# Patient Record
Sex: Female | Born: 1985
Health system: Southern US, Community
[De-identification: ages and names within clinical notes are randomized; demographics above are authoritative.]

## PROBLEM LIST (undated history)

## (undated) DIAGNOSIS — Z23 Encounter for immunization: Secondary | ICD-10-CM

## (undated) DIAGNOSIS — F419 Anxiety disorder, unspecified: Secondary | ICD-10-CM

## (undated) DIAGNOSIS — F32A Depression, unspecified: Secondary | ICD-10-CM

## (undated) DIAGNOSIS — K219 Gastro-esophageal reflux disease without esophagitis: Secondary | ICD-10-CM

## (undated) DIAGNOSIS — Z803 Family history of malignant neoplasm of breast: Secondary | ICD-10-CM

## (undated) DIAGNOSIS — E559 Vitamin D deficiency, unspecified: Secondary | ICD-10-CM

## (undated) DIAGNOSIS — F329 Major depressive disorder, single episode, unspecified: Secondary | ICD-10-CM

## (undated) DIAGNOSIS — Z8049 Family history of malignant neoplasm of other genital organs: Secondary | ICD-10-CM

## (undated) DIAGNOSIS — Z808 Family history of malignant neoplasm of other organs or systems: Secondary | ICD-10-CM

## (undated) DIAGNOSIS — D649 Anemia, unspecified: Secondary | ICD-10-CM

## (undated) DIAGNOSIS — Z9289 Personal history of other medical treatment: Secondary | ICD-10-CM

## (undated) HISTORY — DX: Family history of malignant neoplasm of other organs or systems: Z80.8

## (undated) HISTORY — DX: Anemia, unspecified: D64.9

## (undated) HISTORY — DX: Personal history of other medical treatment: Z92.89

## (undated) HISTORY — DX: Depression, unspecified: F32.A

## (undated) HISTORY — DX: Vitamin D deficiency, unspecified: E55.9

## (undated) HISTORY — DX: Gastro-esophageal reflux disease without esophagitis: K21.9

## (undated) HISTORY — DX: Family history of malignant neoplasm of other genital organs: Z80.49

## (undated) HISTORY — DX: Family history of malignant neoplasm of breast: Z80.3

## (undated) HISTORY — DX: Major depressive disorder, single episode, unspecified: F32.9

## (undated) HISTORY — DX: Anxiety disorder, unspecified: F41.9

## (undated) HISTORY — DX: Encounter for immunization: Z23

---
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
## 2006-05-07 HISTORY — PX: ESOPHAGOGASTRODUODENOSCOPY: SHX1529

## 2007-03-05 ENCOUNTER — Ambulatory Visit: Payer: Self-pay | Admitting: Gastroenterology

## 2007-06-27 ENCOUNTER — Emergency Department: Payer: Self-pay | Admitting: Emergency Medicine

## 2011-07-06 DIAGNOSIS — E559 Vitamin D deficiency, unspecified: Secondary | ICD-10-CM

## 2011-07-06 HISTORY — DX: Vitamin D deficiency, unspecified: E55.9

## 2011-08-24 ENCOUNTER — Ambulatory Visit: Payer: Self-pay | Admitting: Physician Assistant

## 2011-09-05 ENCOUNTER — Ambulatory Visit: Payer: Self-pay | Admitting: Physician Assistant

## 2012-09-04 ENCOUNTER — Encounter: Payer: Self-pay | Admitting: Family Medicine

## 2012-09-04 ENCOUNTER — Telehealth: Payer: Self-pay | Admitting: *Deleted

## 2012-09-04 DIAGNOSIS — B372 Candidiasis of skin and nail: Secondary | ICD-10-CM

## 2012-09-04 MED ORDER — FLUCONAZOLE 100 MG PO TABS: 100.0000 mg | ORAL_TABLET | Freq: Every day | ORAL | Status: DC

## 2012-09-04 NOTE — Telephone Encounter (Signed)
Patient called requesting medication for yeast infection in her breast.  She had this same thing when she initially started breast feeding her daughter and is feeling the same symptoms as she felt the last time.  We will call in Diflucan for her.  She will follow up if symptoms change or fail to get better.

## 2012-12-17 NOTE — Progress Notes (Signed)
This encounter was created in error - please disregard.

## 2015-08-02 LAB — HM PAP SMEAR

## 2015-08-10 DIAGNOSIS — N870 Mild cervical dysplasia: Secondary | ICD-10-CM | POA: Diagnosis not present

## 2016-05-14 DIAGNOSIS — F411 Generalized anxiety disorder: Secondary | ICD-10-CM | POA: Diagnosis not present

## 2016-05-22 DIAGNOSIS — F411 Generalized anxiety disorder: Secondary | ICD-10-CM | POA: Diagnosis not present

## 2016-05-29 DIAGNOSIS — F411 Generalized anxiety disorder: Secondary | ICD-10-CM | POA: Diagnosis not present

## 2016-06-04 DIAGNOSIS — F411 Generalized anxiety disorder: Secondary | ICD-10-CM | POA: Diagnosis not present

## 2016-06-26 DIAGNOSIS — F411 Generalized anxiety disorder: Secondary | ICD-10-CM | POA: Diagnosis not present

## 2016-07-09 DIAGNOSIS — F411 Generalized anxiety disorder: Secondary | ICD-10-CM | POA: Diagnosis not present

## 2016-07-31 DIAGNOSIS — F4323 Adjustment disorder with mixed anxiety and depressed mood: Secondary | ICD-10-CM | POA: Diagnosis not present

## 2016-08-02 ENCOUNTER — Encounter: Payer: Self-pay | Admitting: Obstetrics and Gynecology

## 2016-08-02 ENCOUNTER — Ambulatory Visit (INDEPENDENT_AMBULATORY_CARE_PROVIDER_SITE_OTHER): Payer: BLUE CROSS/BLUE SHIELD | Admitting: Obstetrics and Gynecology

## 2016-08-02 VITALS — BP 100/70 | HR 83 | Ht 63.0 in | Wt 182.0 lb

## 2016-08-02 DIAGNOSIS — Z01419 Encounter for gynecological examination (general) (routine) without abnormal findings: Secondary | ICD-10-CM | POA: Diagnosis not present

## 2016-08-02 DIAGNOSIS — Z124 Encounter for screening for malignant neoplasm of cervix: Secondary | ICD-10-CM

## 2016-08-02 DIAGNOSIS — Z87898 Personal history of other specified conditions: Secondary | ICD-10-CM | POA: Diagnosis not present

## 2016-08-02 DIAGNOSIS — Z30432 Encounter for removal of intrauterine contraceptive device: Secondary | ICD-10-CM

## 2016-08-02 DIAGNOSIS — N941 Unspecified dyspareunia: Secondary | ICD-10-CM | POA: Diagnosis not present

## 2016-08-02 DIAGNOSIS — Z8742 Personal history of other diseases of the female genital tract: Secondary | ICD-10-CM

## 2016-08-02 DIAGNOSIS — Z113 Encounter for screening for infections with a predominantly sexual mode of transmission: Secondary | ICD-10-CM

## 2016-08-02 DIAGNOSIS — Z1151 Encounter for screening for human papillomavirus (HPV): Secondary | ICD-10-CM | POA: Diagnosis not present

## 2016-08-02 NOTE — Progress Notes (Signed)
HPI:      [name] is a 31 y.o. G0P0000 who LMP was Patient's last menstrual period was 06/24/2016., presents today for her annual examination.  Her menses are rare, lasting 1 days.  Dysmenorrhea none. She does not have intermenstrual bleeding.  Sex activity: single partner, contraception - IUD. Mirena placed 03/13/12. She has noted dyspareunia for the past 6 months. She would like IUD removed and would like to use condoms for now. She may want to restart nuvaring in the future.  Last Pap: August 02, 2015  Results were: low-grade squamous intraepithelial neoplasia (LGSIL - encompassing HPV,mild dysplasia,CIN I) . Colpo 4/17 with neg ECC. Repeat pap due today.   There is no FH of breast cancer. There is no FH of ovarian cancer. The patient does do self-breast exams.  Tobacco use: The patient denies current or previous tobacco use. Alcohol use: social drinker Exercise: moderately active  She does get adequate calcium and Vitamin D in her diet.   No past medical history on file.  No past surgical history on file.  Family History  Problem Relation Age of Onset  . Skin cancer Mother   . Lung cancer Maternal Grandmother      ROS:  Review of Systems  Constitutional: Negative for fever, malaise/fatigue and weight loss.  HENT: Negative for congestion, ear pain and sinus pain.   Respiratory: Negative for cough, shortness of breath and wheezing.   Cardiovascular: Negative for chest pain, orthopnea and leg swelling.  Gastrointestinal: Negative for blood in stool, constipation, diarrhea, nausea and vomiting.  Genitourinary: Negative for dysuria, flank pain, frequency, hematuria and urgency.       Breast ROS: negative   Musculoskeletal: Negative for back pain, joint pain and myalgias.  Skin: Negative for itching and rash.  Neurological: Negative for dizziness, tingling, focal weakness and headaches.  Endo/Heme/Allergies: Negative for environmental allergies. Does not  bruise/bleed easily.  Psychiatric/Behavioral: Negative for depression and suicidal ideas. The patient is not nervous/anxious and does not have insomnia.     Objective: BP 100/70   Pulse 83   Ht 5\' 3"  (1.6 m)   Wt 182 lb (82.6 kg)   LMP 06/24/2016 Comment: iud  BMI 32.24 kg/m    Physical Exam  Constitutional: She is oriented to person, place, and time. She appears well-developed and well-nourished.  Genitourinary: Vagina normal and uterus normal. No erythema or tenderness in the vagina. No vaginal discharge found. Right adnexum does not display mass and does not display tenderness. Left adnexum does not display mass and does not display tenderness.  Cervix exhibits visible IUD strings. Cervix does not exhibit motion tenderness or polyp. Uterus is not enlarged or tender.  Neck: Normal range of motion. No thyromegaly present.  Cardiovascular: Normal rate, regular rhythm and normal heart sounds.   No murmur heard. Pulmonary/Chest: Effort normal and breath sounds normal. Right breast exhibits no mass, no nipple discharge, no skin change and no tenderness. Left breast exhibits no mass, no nipple discharge, no skin change and no tenderness.  Abdominal: Soft. There is no tenderness. There is no guarding.  Musculoskeletal: Normal range of motion.  Neurological: She is alert and oriented to person, place, and time. No cranial nerve deficit.  Psychiatric: She has a normal mood and affect. Her behavior is normal.  Vitals reviewed.   Assessment/Plan: Encounter for annual routine gynecological examination  Cervical cancer screening - Plan: IGP,CtNgTv,Apt HPV,rfx16/18,45  Screening for HPV (human papillomavirus) - Plan: IGP,CtNgTv,Apt HPV,rfx16/18,45  History  of abnormal cervical Pap smear - Will call pt with results and mgmt. - Plan: IGP,CtNgTv,Apt HPV,rfx16/18,45  Screening for STD (sexually transmitted disease) - Plan: IGP,CtNgTv,Apt HPV,rfx16/18,45  Encounter for removal of intrauterine  contraceptive device (IUD) - Pt wants IUD removed due to dyspareunia for the past 6 months. Due for rem 11/18 anyway. Pt to f/u if desires BC, possibly nuvaring. Condoms in meantime.  Dyspareunia in female - Check nuswab. F/u for u/s if sx persist.            GYN counsel family planning choices, adequate intake of calcium and vitamin D     F/U  Return in about 1 year (around 08/02/2017).  Briana Reed. Briana Gilmore, PA-C 08/02/2016 10:21 AM   IUD REMOVAL NOTE:    History of Present Illness:  Briana Reed is a 31 y.o. that had a Mirena IUD placed approximately 4 yrs ago.   BP 100/70   Pulse 83   Ht 5\' 3"  (1.6 m)   Wt 182 lb (82.6 kg)   LMP 06/24/2016 Comment: iud  BMI 32.24 kg/m   Pelvic exam:  Two IUD strings present seen coming from the cervical os. EGBUS, vaginal vault and cervix: within normal limits  IUD Removal Strings of IUD identified and grasped.  IUD removed without problem with ring forceps.  Pt tolerated this well.  IUD noted to be intact.  Assessment:  IUD Removal   Plan: IUD removed and plan for contraception is condoms. She was amenable to this plan.   Briana Reed Reed. Briana Townley, PA-C 08/02/2016 10:21 AM

## 2016-08-09 LAB — IGP,CTNGTV,APT HPV,RFX16/18,45
Chlamydia, Nuc. Acid Amp: NEGATIVE
GONOCOCCUS, NUC. ACID AMP: NEGATIVE
HPV APTIMA: POSITIVE — AB
HPV Genotype 16: NEGATIVE
HPV Genotype 18,45: NEGATIVE
PAP Smear Comment: 0
Trich vag by NAA: NEGATIVE

## 2016-08-14 DIAGNOSIS — F4323 Adjustment disorder with mixed anxiety and depressed mood: Secondary | ICD-10-CM | POA: Diagnosis not present

## 2016-09-18 DIAGNOSIS — F4323 Adjustment disorder with mixed anxiety and depressed mood: Secondary | ICD-10-CM | POA: Diagnosis not present

## 2017-01-08 ENCOUNTER — Ambulatory Visit: Payer: BLUE CROSS/BLUE SHIELD | Admitting: Obstetrics and Gynecology

## 2017-01-16 ENCOUNTER — Telehealth: Payer: Self-pay

## 2017-01-16 ENCOUNTER — Other Ambulatory Visit: Payer: Self-pay | Admitting: Obstetrics and Gynecology

## 2017-01-16 MED ORDER — ETONOGESTREL-ETHINYL ESTRADIOL 0.12-0.015 MG/24HR VA RING: VAGINAL_RING | VAGINAL | 7 refills | Status: DC

## 2017-01-16 NOTE — Telephone Encounter (Signed)
Pt aware via vm 

## 2017-01-16 NOTE — Telephone Encounter (Signed)
Pt wanting NuvaRing RX. States that she spoke with [facility] and was told to call and let her know which BC she wanted to try. fwding to [facility] for RX for [employer].

## 2017-01-16 NOTE — Telephone Encounter (Signed)
Rx eRxd. RN to notify pt to start with next menses. Condoms for 1 mo. F/u prn.

## 2017-01-28 DIAGNOSIS — F4323 Adjustment disorder with mixed anxiety and depressed mood: Secondary | ICD-10-CM | POA: Diagnosis not present

## 2017-02-04 DIAGNOSIS — F411 Generalized anxiety disorder: Secondary | ICD-10-CM | POA: Diagnosis not present

## 2017-02-18 DIAGNOSIS — F411 Generalized anxiety disorder: Secondary | ICD-10-CM | POA: Diagnosis not present

## 2017-03-13 DIAGNOSIS — F411 Generalized anxiety disorder: Secondary | ICD-10-CM | POA: Diagnosis not present

## 2017-04-03 DIAGNOSIS — F411 Generalized anxiety disorder: Secondary | ICD-10-CM | POA: Diagnosis not present

## 2017-04-17 DIAGNOSIS — F411 Generalized anxiety disorder: Secondary | ICD-10-CM | POA: Diagnosis not present

## 2017-04-25 DIAGNOSIS — F411 Generalized anxiety disorder: Secondary | ICD-10-CM | POA: Diagnosis not present

## 2017-05-08 DIAGNOSIS — F411 Generalized anxiety disorder: Secondary | ICD-10-CM | POA: Diagnosis not present

## 2017-05-16 DIAGNOSIS — F411 Generalized anxiety disorder: Secondary | ICD-10-CM | POA: Diagnosis not present

## 2017-05-28 DIAGNOSIS — F411 Generalized anxiety disorder: Secondary | ICD-10-CM | POA: Diagnosis not present

## 2017-06-12 ENCOUNTER — Encounter: Payer: Self-pay | Admitting: Family Medicine

## 2017-06-12 ENCOUNTER — Encounter (INDEPENDENT_AMBULATORY_CARE_PROVIDER_SITE_OTHER): Payer: Self-pay

## 2017-06-12 ENCOUNTER — Ambulatory Visit: Payer: BLUE CROSS/BLUE SHIELD | Admitting: Family Medicine

## 2017-06-12 VITALS — BP 122/74 | HR 82 | Temp 98.6°F | Resp 16 | Ht 64.75 in | Wt 188.5 lb

## 2017-06-12 DIAGNOSIS — R5383 Other fatigue: Secondary | ICD-10-CM | POA: Diagnosis not present

## 2017-06-12 DIAGNOSIS — Z23 Encounter for immunization: Secondary | ICD-10-CM

## 2017-06-12 DIAGNOSIS — Z13220 Encounter for screening for lipoid disorders: Secondary | ICD-10-CM | POA: Diagnosis not present

## 2017-06-12 DIAGNOSIS — R87810 Cervical high risk human papillomavirus (HPV) DNA test positive: Secondary | ICD-10-CM

## 2017-06-12 DIAGNOSIS — Z113 Encounter for screening for infections with a predominantly sexual mode of transmission: Secondary | ICD-10-CM | POA: Diagnosis not present

## 2017-06-12 DIAGNOSIS — R87610 Atypical squamous cells of undetermined significance on cytologic smear of cervix (ASC-US): Secondary | ICD-10-CM | POA: Insufficient documentation

## 2017-06-12 DIAGNOSIS — G4700 Insomnia, unspecified: Secondary | ICD-10-CM | POA: Diagnosis not present

## 2017-06-12 DIAGNOSIS — Z131 Encounter for screening for diabetes mellitus: Secondary | ICD-10-CM

## 2017-06-12 DIAGNOSIS — R635 Abnormal weight gain: Secondary | ICD-10-CM | POA: Diagnosis not present

## 2017-06-12 DIAGNOSIS — L659 Nonscarring hair loss, unspecified: Secondary | ICD-10-CM | POA: Diagnosis not present

## 2017-06-12 DIAGNOSIS — F411 Generalized anxiety disorder: Secondary | ICD-10-CM | POA: Diagnosis not present

## 2017-06-12 DIAGNOSIS — F330 Major depressive disorder, recurrent, mild: Secondary | ICD-10-CM | POA: Diagnosis not present

## 2017-06-12 MED ORDER — BUPROPION HCL ER (XL) 150 MG PO TB24: 150.0000 mg | ORAL_TABLET | Freq: Every day | ORAL | 1 refills | Status: DC

## 2017-06-12 NOTE — Patient Instructions (Signed)
Persistent Depressive Disorder Persistent depressive disorder (PDD) is a mental health condition. PDD causes symptoms of low-level depression for 2 years or longer. It may also be called long-term (chronic) depression or dysthymia. PDD may include episodes of more severe depression that last for about 2 weeks (major depressive disorder or MDD). PDD can affect the way you think, feel, and sleep. This condition may also affect your relationships. You may be more likely to get sick if you have PDD. Symptoms of PDD occur for most of the day and may include:  Feeling tired (fatigue).  Low energy.  Eating too much or too little.  Sleeping too much or too little.  Feeling restless or agitated.  Feeling hopeless.  Feeling worthless or guilty.  Feeling worried or nervous (anxiety).  Trouble concentrating or making decisions.  Low self-esteem.  A negative way of looking at things (outlook).  Not being able to have fun or feel pleasure.  Avoiding interacting with people.  Getting angry or annoyed easily (irritability).  Acting aggressive or angry.  Follow these instructions at home: Activity  Go back to your normal activities as told by your doctor.  Exercise regularly as told by your doctor. General instructions  Take over-the-counter and prescription medicines only as told by your doctor.  Do not drink alcohol. Or, limit how much alcohol you drink to no more than 1 drink a day for nonpregnant women and 2 drinks a day for men. One drink equals 12 oz of beer, 5 oz of wine, or 1 oz of hard liquor. Alcohol can affect any antidepressant medicines you are taking. Talk with your doctor about your alcohol use.  Eat a healthy diet and get plenty of sleep.  Find activities that you enjoy each day.  Consider joining a support group. Your doctor may be able to suggest a support group.  Keep all follow-up visits as told by your doctor. This is important. Where to find more  information: National Alliance on Mental Illness  www.nami.org  U.[place]. National Institute of Mental Health  www.nimh.nih.gov  National Suicide Prevention Lifeline  1-800-273-TALK (1-800-273-8255). This is free, 24-hour help.  Contact a doctor if:  Your symptoms get worse.  You have new symptoms.  You have trouble sleeping or doing your daily activities. Get help right away if:  You self-harm.  You have serious thoughts about hurting yourself or others.  You see, hear, taste, smell, or feel things that are not there (hallucinate). This information is not intended to replace advice given to you by your health care provider. Make sure you discuss any questions you have with your health care provider. Document Released: 04/04/2015 Document Revised: 12/16/2015 Document Reviewed: 12/16/2015 Elsevier Interactive Patient Education  2017 Elsevier Inc.  

## 2017-06-12 NOTE — Progress Notes (Signed)
Name: Briana Reed   MRN: [id number]    DOB: 05/11/1985   Date:06/12/2017       Progress Note  Subjective  Chief Complaint  Chief Complaint  Patient presents with  . Depression  . Insomnia    HPI  Depression: she has a long history of depression - symptoms started in 6th grade, she took Prozac and Wellbutrin in the past and likely Wellbutrin the past. She has been off medication for the past 8 years. She went through a divorce in 2017, having therapy with Dr. Sunday ShamsFaro since 2018 twice a month, however noticing that symptoms worse over the past 6 months. Mother diagnosed with endometrial cancer back in 01/2017, her sister has not sharing the load. Living with her boyfriend, and he is good but he struggles financially. 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 She is not able to maintain her house organized, she enjoys cooking but has been slacking at that also. Difficulty falling and staying asleep. Gaining weight, feeling tired. Stopped going to the gym two weeks ago. Difficulty getting motivated.    Patient Active Problem List   Diagnosis Date Noted  . ASCUS with positive high risk HPV cervical 06/12/2017    Past Surgical History:  Procedure Laterality Date  . ESOPHAGOGASTRODUODENOSCOPY  2008   WNL    Family History  Problem Relation Age of Onset  . Skin cancer Mother [id number]       MELANOMA  . Hypertension Mother   . Endometrial cancer Mother        on radiation treatments  . Lung cancer Maternal Grandmother 80  . Uterine cancer Maternal Grandmother   . Hypertension Father   . Stroke Father   . Heart attack Maternal Grandfather   . Heart attack Paternal Grandmother   . Heart attack Paternal Grandfather     Social History   Socioeconomic History  . Marital status: Single    Spouse name: Not on file  . Number of children: 0  . Years of education: 5414  . Highest education level: Associate degree:  occupational, Scientist, product/process developmenttechnical, or vocational program  Social Needs  . Financial resource strain: Not very hard  . Food insecurity - worry: Never true  . Food insecurity - inability: Never true  . Transportation needs - medical: No  . Transportation needs - non-medical: No  Occupational History  . Occupation: bartender     Comment: [employer] -[employer]aurant   Tobacco Use  . Smoking status: Never Smoker  . Smokeless tobacco: Never Used  Substance and Sexual Activity  . Alcohol use: Yes    Alcohol/week: 1.8 oz    Types: 3 Glasses of wine per week  . Drug use: No  . Sexual activity: Yes    Partners: Male    Birth control/protection: Condom  Other Topics Concern  . Not on file  Social History Narrative   She lives with boyfriend ( he is a Insurance underwritertattoo artist) , she works as a Leisure centre managerbartender and is going to school full time to become a Research officer, trade unionmortician    Worried about her mother, getting treatment for endometrial cancer   She was married previously from 2013 till 2016 - divorced since 2017     Current Outpatient Medications:  .  BIOTIN SL, Place 10,000 each under the tongue daily., Disp: , Rfl:  .  Echinacea 400 MG CAPS, Take 1 capsule by mouth as needed., Disp: , Rfl:  .  Prenatal Vit-Fe Fumarate-FA (PRENATAL MULTIVITAMIN) TABS tablet, Take 1 tablet by mouth daily at 12 noon., Disp: , Rfl:  .  vitamin C (ASCORBIC ACID) 500 MG tablet, Take 1,000 mg by mouth as needed., Disp: , Rfl:  .  buPROPion (WELLBUTRIN XL) 150 MG 24 hr tablet, Take 1 tablet (150 mg total) by mouth daily., Disp: 30 tablet, Rfl: 1  No Known Allergies   ROS  Constitutional: Negative for fever, positive for  weight change.  Respiratory: Negative for cough and shortness of breath.   Cardiovascular: Negative for chest pain or palpitations.  Gastrointestinal: Negative for abdominal pain, no bowel changes.  Musculoskeletal: Negative for gait problem or joint swelling.  Skin: Negative for rash.  Neurological: Negative for dizziness or headache.   No other specific complaints in a complete review of systems (except as listed in HPI above).  Objective  Vitals:   06/12/17 0913  BP: 122/74  Pulse: 82  Resp: 16  Temp: 98.6 F (37 C)  TempSrc: Oral  SpO2: 98%  Weight: 188 lb 8 oz (85.5 kg)  Height: 5' 4.75" (1.645 m)    Body mass index is 31.61 kg/m.  Physical Exam  Constitutional: Patient appears well-developed and well-nourished. Obese  No distress.  HEENT: head atraumatic, normocephalic, pupils equal and reactive to light, neck supple, throat within normal limits Cardiovascular: Normal rate, regular rhythm and normal heart sounds.  No murmur heard. No BLE edema. Pulmonary/Chest: Effort normal and breath sounds normal. No respiratory distress. Abdominal: Soft.  There is no tenderness. Psychiatric: Patient has a normal mood and affect. behavior is normal. Judgment and thought content normal.  PHQ2/9: Depression screen PHQ 2/9 06/12/2017  Decreased Interest 2  Down, Depressed, Hopeless 2  PHQ - 2 Score 4  Altered sleeping 2  Tired, decreased energy 3  Change in appetite 1  Feeling bad or failure about yourself  1  Trouble concentrating 3  Moving slowly or fidgety/restless 0  Suicidal thoughts 0  PHQ-9 Score 14  Difficult doing work/chores Somewhat difficult    Fall Risk: Fall Risk  06/12/2017  Falls in the past year? No    Functional Status Survey: Is the patient deaf or have difficulty hearing?: No Does the patient have difficulty seeing, even when wearing glasses/contacts?: No Does the patient have difficulty concentrating, remembering, or making decisions?: No Does the patient have difficulty walking or climbing stairs?: No Does the patient have difficulty dressing or bathing?: No Does the patient have difficulty doing errands alone such as visiting a doctor's office or shopping?: No    Assessment & Plan  1. Mild recurrent major depression (HCC)  - buPROPion (WELLBUTRIN XL) 150 MG 24 hr tablet; Take  1 tablet (150 mg total) by mouth daily.  Dispense: 30 tablet; Refill: 1  2. Insomnia, unspecified type  She will try otc melatonin  3. Weight gain  - TSH  4. Hair loss  - TSH  5. Lipid screening  - Lipid panel  6. Diabetes mellitus screening  - Hemoglobin A1c  7. Needs flu shot  refused  8. Need for Tdap vaccination  - Tdap vaccine greater than or equal to 7yo IM  9. Routine screening for STI (sexually transmitted infection)  - RPR - HIV antibody - Hepatitis, Acute  10. Other fatigue  - Vitamin B12 - VITAMIN D 25 Hydroxy (Vit-D Deficiency, Fractures) - CBC with Differential/Platelet - Comprehensive metabolic panel

## 2017-06-13 LAB — CBC WITH DIFFERENTIAL/PLATELET
BASOS PCT: 0.6 %
Basophils Absolute: 43 cells/uL (ref 0–200)
EOS PCT: 0.6 %
Eosinophils Absolute: 43 cells/uL (ref 15–500)
HEMATOCRIT: 37.8 % (ref 35.0–45.0)
HEMOGLOBIN: 13 g/dL (ref 11.7–15.5)
LYMPHS ABS: 1490 {cells}/uL (ref 850–3900)
MCH: 32.1 pg (ref 27.0–33.0)
MCHC: 34.4 g/dL (ref 32.0–36.0)
MCV: 93.3 fL (ref 80.0–100.0)
MONOS PCT: 7.8 %
MPV: 9.8 fL (ref 7.5–12.5)
NEUTROS ABS: 5062 {cells}/uL (ref 1500–7800)
Neutrophils Relative %: 70.3 %
Platelets: 349 10*3/uL (ref 140–400)
RBC: 4.05 10*6/uL (ref 3.80–5.10)
RDW: 12 % (ref 11.0–15.0)
Total Lymphocyte: 20.7 %
WBC mixed population: 562 cells/uL (ref 200–950)
WBC: 7.2 10*3/uL (ref 3.8–10.8)

## 2017-06-13 LAB — HEPATITIS PANEL, ACUTE
HEP A IGM: NONREACTIVE
Hep B C IgM: NONREACTIVE
Hepatitis B Surface Ag: NONREACTIVE
Hepatitis C Ab: NONREACTIVE
SIGNAL TO CUT-OFF: 0.01 (ref <1.00)

## 2017-06-13 LAB — LIPID PANEL
Cholesterol: 140 mg/dL (ref <200)
HDL: 71 mg/dL (ref >50)
LDL Cholesterol (Calc): 55 mg/dL (calc)
NON-HDL CHOLESTEROL (CALC): 69 mg/dL (ref <130)
TRIGLYCERIDES: 60 mg/dL (ref <150)
Total CHOL/HDL Ratio: 2 (calc) (ref <5.0)

## 2017-06-13 LAB — COMPREHENSIVE METABOLIC PANEL
AG RATIO: 1.6 (calc) (ref 1.0–2.5)
ALBUMIN MSPROF: 4.5 g/dL (ref 3.6–5.1)
ALT: 10 U/L (ref 6–29)
AST: 10 U/L (ref 10–30)
Alkaline phosphatase (APISO): 43 U/L (ref 33–115)
BILIRUBIN TOTAL: 0.4 mg/dL (ref 0.2–1.2)
BUN: 7 mg/dL (ref 7–25)
CALCIUM: 9.8 mg/dL (ref 8.6–10.2)
CO2: 25 mmol/L (ref 20–32)
Chloride: 105 mmol/L (ref 98–110)
Creat: 0.65 mg/dL (ref 0.50–1.10)
GLOBULIN: 2.9 g/dL (ref 1.9–3.7)
GLUCOSE: 100 mg/dL (ref 65–139)
POTASSIUM: 4.3 mmol/L (ref 3.5–5.3)
SODIUM: 138 mmol/L (ref 135–146)
TOTAL PROTEIN: 7.4 g/dL (ref 6.1–8.1)

## 2017-06-13 LAB — HEMOGLOBIN A1C
Hgb A1c MFr Bld: 5 % of total Hgb (ref <5.7)
Mean Plasma Glucose: 97 (calc)
eAG (mmol/L): 5.4 (calc)

## 2017-06-13 LAB — VITAMIN D 25 HYDROXY (VIT D DEFICIENCY, FRACTURES): Vit D, 25-Hydroxy: 26 ng/mL — ABNORMAL LOW (ref 30–100)

## 2017-06-13 LAB — HIV ANTIBODY (ROUTINE TESTING W REFLEX): HIV 1&2 Ab, 4th Generation: NONREACTIVE

## 2017-06-13 LAB — RPR: RPR: NONREACTIVE

## 2017-06-13 LAB — TSH: TSH: 2.08 m[IU]/L

## 2017-06-13 LAB — VITAMIN B12: VITAMIN B 12: 430 pg/mL (ref 200–1100)

## 2017-06-19 ENCOUNTER — Ambulatory Visit: Payer: BLUE CROSS/BLUE SHIELD | Admitting: Obstetrics and Gynecology

## 2017-06-26 DIAGNOSIS — F411 Generalized anxiety disorder: Secondary | ICD-10-CM | POA: Diagnosis not present

## 2017-07-16 DIAGNOSIS — F411 Generalized anxiety disorder: Secondary | ICD-10-CM | POA: Diagnosis not present

## 2017-07-24 ENCOUNTER — Ambulatory Visit: Payer: BLUE CROSS/BLUE SHIELD | Admitting: Family Medicine

## 2017-07-24 ENCOUNTER — Encounter: Payer: Self-pay | Admitting: Family Medicine

## 2017-07-24 VITALS — BP 126/74 | HR 90 | Temp 97.9°F | Resp 16 | Ht 65.0 in | Wt 187.3 lb

## 2017-07-24 DIAGNOSIS — Z23 Encounter for immunization: Secondary | ICD-10-CM | POA: Diagnosis not present

## 2017-07-24 DIAGNOSIS — F330 Major depressive disorder, recurrent, mild: Secondary | ICD-10-CM

## 2017-07-24 DIAGNOSIS — G4700 Insomnia, unspecified: Secondary | ICD-10-CM | POA: Diagnosis not present

## 2017-07-24 MED ORDER — BUPROPION HCL ER (XL) 150 MG PO TB24: 150.0000 mg | ORAL_TABLET | Freq: Every day | ORAL | 0 refills | Status: DC

## 2017-07-24 MED ORDER — TRAZODONE HCL 50 MG PO TABS: 25.0000 mg | ORAL_TABLET | Freq: Every evening | ORAL | 0 refills | Status: DC | PRN

## 2017-07-24 NOTE — Progress Notes (Signed)
Name: Briana Reed   MRN: [id number]    DOB: [birth year]   Date:07/24/2017       Progress Note  Subjective  Chief Complaint  Chief Complaint  Patient presents with  . Depression    medication has depression under control she feels much better since starting medication.    HPI  Depression Major: she is doing better, on Wellbutrin for the past 6 weeks, and not feeling as overwhelmed. Phq9 has improved down from 14 to 8. Initially felt jittery with wellbutrin but that has resolved. She has more energy, still has difficulty focusing but not as intense. She states she is not sleeping well, difficulty falling and staying asleep, willing to try Trazodone.    Patient Active Problem List   Diagnosis Date Noted  . ASCUS with positive high risk HPV cervical 06/12/2017    Past Surgical History:  Procedure Laterality Date  . ESOPHAGOGASTRODUODENOSCOPY  2008   WNL    Family History  Problem Relation Age of Onset  . Skin cancer Mother 73       MELANOMA  . Hypertension Mother   . Endometrial cancer Mother        on radiation treatments  . Lung cancer Maternal Grandmother 80  . Uterine cancer Maternal Grandmother   . Hypertension Father   . Stroke Father   . Heart attack Maternal Grandfather   . Heart attack Paternal Grandmother   . Heart attack Paternal Grandfather     Social History   Socioeconomic History  . Marital status: Single    Spouse name: Not on file  . Number of children: 0  . Years of education: 1  . Highest education level: Associate degree: occupational, Scientist, product/process development, or vocational program  Social Needs  . Financial resource strain: Not very hard  . Food insecurity - worry: Never true  . Food insecurity - inability: Never true  . Transportation needs - medical: No  . Transportation needs - non-medical: No  Occupational History  . Occupation: bartender     Comment: Tourist information centre manager   Tobacco Use  . Smoking status: Never Smoker  . Smokeless tobacco: Never Used   Substance and Sexual Activity  . Alcohol use: Yes    Alcohol/week: 1.8 oz    Types: 3 Glasses of wine per week  . Drug use: No  . Sexual activity: Yes    Partners: Male    Birth control/protection: Condom, Inserts    Comment: Nuvaring  Other Topics Concern  . 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  BIOTIN SL, Place 10,000 each under the tongue daily., Disp: , Rfl:  .  buPROPion (WELLBUTRIN XL) 150 MG 24 hr tablet, Take 1 tablet (150 mg total) by mouth daily., Disp: 30 tablet, Rfl: 1 .  Echinacea 400 MG CAPS, Take 1 capsule by mouth as needed., Disp: , Rfl:  .  NUVARING 0.12-0.015 MG/24HR vaginal ring, , Disp: , Rfl: 6 .  Prenatal Vit-Fe Fumarate-FA (PRENATAL MULTIVITAMIN) TABS tablet, Take 1 tablet by mouth daily at 12 noon., Disp: , Rfl:  .  vitamin C (ASCORBIC ACID) 500 MG tablet, Take 1,000 mg  by mouth as needed., Disp: , Rfl:   No Known Allergies   ROS  Constitutional: Negative for fever or weight change.  Respiratory: Negative for cough and shortness of breath.   Cardiovascular: Negative for chest pain or palpitations.  Gastrointestinal: Negative for abdominal pain, no bowel changes.  Musculoskeletal: Negative for gait problem or joint swelling.  Skin: Negative for rash.  Neurological: Negative for dizziness or headache.  No other specific complaints in a complete review of systems (except as listed in HPI above).  Objective  Vitals:   07/24/17 1049  BP: 126/74  Pulse: 90  Resp: 16  Temp: 97.9 F (36.6 C)  TempSrc: Oral  SpO2: 98%  Weight: 187 lb 4.8 oz (85 kg)  Height: 5\' 5"  (1.651 m)    Body mass index is 31.17 kg/m.  Physical  Exam  Constitutional: Patient appears well-developed and well-nourished. Obese  No distress.  HEENT: head atraumatic, normocephalic, pupils equal and reactive to light, neck supple, throat within normal limits Cardiovascular: Normal rate, regular rhythm and normal heart sounds.  No murmur heard. No BLE edema. Pulmonary/Chest: Effort normal and breath sounds normal. No respiratory distress. Abdominal: Soft.  There is no tenderness. Psychiatric: Patient has a normal mood and affect. behavior is normal. Judgment and thought content normal.  Recent Results (from the past 2160 hour(s))  TSH     Status: None   Collection Time: 06/12/17 10:10 AM  Result Value Ref Range   TSH 2.08 mIU/L    Comment:           Reference Range .           > or = 20 Years  0.40-4.50 .                Pregnancy Ranges           First trimester    0.26-2.66           Second trimester   0.55-2.73           Third trimester    0.43-2.91   RPR     Status: None   Collection Time: 06/12/17 10:10 AM  Result Value Ref Range   RPR Ser Ql NON-REACTIVE NON-REACTI  Vitamin B12     Status: None   Collection Time: 06/12/17 10:10 AM  Result Value Ref Range   Vitamin B-12 430 200 - 1,100 pg/mL  VITAMIN D 25 Hydroxy (Vit-D Deficiency, Fractures)     Status: Abnormal   Collection Time: 06/12/17 10:10 AM  Result Value Ref Range   Vit D, 25-Hydroxy 26 (L) 30 - 100 ng/mL    Comment: Vitamin D Status         25-OH Vitamin D: . Deficiency:                    <20 ng/mL Insufficiency:             20 - 29 ng/mL Optimal:                 > or = 30 ng/mL . For 25-OH Vitamin D testing on patients on  D2-supplementation and patients for whom quantitation  of D2 and D3 fractions is required, the QuestAssureD(TM) 25-OH VIT D, (D2,D3), LC/MS/MS is recommended: order  code 40981 (patients >42yrs). . For more information on this test, go to: http://education.questdiagnostics.com/faq/FAQ163 (This link is being provided for   informational/educational purposes only.)   CBC with Differential/Platelet     Status: None  Collection Time: 06/12/17 10:10 AM  Result Value Ref Range   WBC 7.2 3.8 - 10.8 Thousand/uL   RBC 4.05 3.80 - 5.10 Million/uL   Hemoglobin 13.0 11.7 - 15.5 g/dL   HCT 16.1 09.6 - 04.5 %   MCV 93.3 80.0 - 100.0 fL   MCH 32.1 27.0 - 33.0 pg   MCHC 34.4 32.0 - 36.0 g/dL   RDW 40.9 81.1 - 91.4 %   Platelets 349 140 - 400 Thousand/uL   MPV 9.8 7.5 - 12.5 fL   Neutro Abs 5,062 1,500 - 7,800 cells/uL   Lymphs Abs 1,490 850 - 3,900 cells/uL   WBC mixed population 562 200 - 950 cells/uL   Eosinophils Absolute 43 15 - 500 cells/uL   Basophils Absolute 43 0 - 200 cells/uL   Neutrophils Relative % 70.3 %   Total Lymphocyte 20.7 %   Monocytes Relative 7.8 %   Eosinophils Relative 0.6 %   Basophils Relative 0.6 %  Comprehensive metabolic panel     Status: None   Collection Time: 06/12/17 10:10 AM  Result Value Ref Range   Glucose, Bld 100 65 - 139 mg/dL    Comment: .        Non-fasting reference interval .    BUN 7 7 - 25 mg/dL   Creat 7.82 9.56 - 2.13 mg/dL   BUN/Creatinine Ratio NOT APPLICABLE 6 - 22 (calc)   Sodium 138 135 - 146 mmol/L   Potassium 4.3 3.5 - 5.3 mmol/L   Chloride 105 98 - 110 mmol/L   CO2 25 20 - 32 mmol/L   Calcium 9.8 8.6 - 10.2 mg/dL   Total Protein 7.4 6.1 - 8.1 g/dL   Albumin 4.5 3.6 - 5.1 g/dL   Globulin 2.9 1.9 - 3.7 g/dL (calc)   AG Ratio 1.6 1.0 - 2.5 (calc)   Total Bilirubin 0.4 0.2 - 1.2 mg/dL   Alkaline phosphatase (APISO) 43 33 - 115 U/L   AST 10 10 - 30 U/L   ALT 10 6 - 29 U/L  Hemoglobin A1c     Status: None   Collection Time: 06/12/17 10:10 AM  Result Value Ref Range   Hgb A1c MFr Bld 5.0 <5.7 % of total Hgb    Comment: For the purpose of screening for the presence of diabetes: . <5.7%       Consistent with the absence of diabetes 5.7-6.4%    Consistent with increased risk for diabetes             (prediabetes) > or =6.5%  Consistent with  diabetes . This assay result is consistent with a decreased risk of diabetes. . Currently, no consensus exists regarding use of hemoglobin A1c for diagnosis of diabetes in children. . According to American Diabetes Association (ADA) guidelines, hemoglobin A1c <7.0% represents optimal control in non-pregnant diabetic patients. Different metrics may apply to specific patient populations.  Standards of Medical Care in Diabetes(ADA). .    Mean Plasma Glucose 97 (calc)   eAG (mmol/L) 5.4 (calc)  HIV antibody     Status: None   Collection Time: 06/12/17 10:10 AM  Result Value Ref Range   HIV 1&2 Ab, 4th Generation NON-REACTIVE NON-REACTI    Comment: HIV-1 antigen and HIV-1/HIV-2 antibodies were not detected. There is no laboratory evidence of HIV infection. Marland Kitchen PLEASE NOTE: This information has been disclosed to you from records whose confidentiality may be protected by state law.  If your state requires such protection, then the state law  prohibits you from making any further disclosure of the information without the specific written consent of the person to whom it pertains, or as otherwise permitted by law. A general authorization for the release of medical or other information is NOT sufficient for this purpose. . For additional information please refer to http://education.questdiagn[facility].com/faq/FAQ106 (This link is being provided for informational/ educational purposes only.) . Marland Kitchen. The performance of this assay has not been clinically validated in patients less than 31 years old. .   Lipid panel     Status: None   Collection Time: 06/12/17 10:10 AM  Result Value Ref Range   Cholesterol 140 <200 mg/dL   HDL 71 >16>50 mg/dL   Triglycerides 60 <109<150 mg/dL   LDL Cholesterol (Calc) 55 mg/dL (calc)    Comment: Reference range: <100 . Desirable range <100 mg/dL for primary prevention;   <70 mg/dL for patients with CHD or diabetic patients  with > or = 2 CHD risk  factors. Marland Kitchen. LDL-C is now calculated using the Martin-Hopkins  calculation, which is a validated novel method providing  better accuracy than the Friedewald equation in the  estimation of LDL-C.  Horald PollenMartin SS et al. Lenox AhrJAMA. 6045;409(812013;310(19): 2061-2068  (http://education.[facility].com/faq/FAQ164)    Total CHOL/HDL Ratio 2.0 <5.0 (calc)   Non-HDL Cholesterol (Calc) 69 <191<130 mg/dL (calc)    Comment: For patients with diabetes plus 1 major ASCVD risk  factor, treating to a non-HDL-C goal of <100 mg/dL  (LDL-C of <47<70 mg/dL) is considered a therapeutic  option.   Hepatitis, Acute     Status: None   Collection Time: 06/12/17 10:10 AM  Result Value Ref Range   Hep A IgM NON-REACTIVE NON-REACTI   Hepatitis B Surface Ag NON-REACTIVE NON-REACTI   Hep B C IgM NON-REACTIVE NON-REACTI   Hepatitis C Ab NON-REACTIVE NON-REACTI   SIGNAL TO CUT-OFF 0.01 <1.00     PHQ2/9: Depression screen [facility] 07/24/2017 06/12/2017  Decreased Interest 0 2  Down, Depressed, Hopeless 1 2  PHQ - 2 Score 1 4  Altered sleeping 2 2  Tired, decreased energy 1 3  Change in appetite 1 1  Feeling bad or failure about yourself  1 1  Trouble concentrating 2 3  Moving slowly or fidgety/restless 0 0  Suicidal thoughts 0 0  PHQ-9 Score 8 14  Difficult doing work/chores Not difficult at all Somewhat difficult    Fall Risk: Fall Risk  07/24/2017 07/24/2017 06/12/2017  Falls in the past year? No No No    Functional Status Survey: Is the patient deaf or have difficulty hearing?: No Does the patient have difficulty seeing, even when wearing glasses/contacts?: No Does the patient have difficulty concentrating, remembering, or making decisions?: No Does the patient have difficulty walking or climbing stairs?: No Does the patient have difficulty dressing or bathing?: No Does the patient have difficulty doing errands alone such as visiting a doctor's office or shopping?: No    Assessment & Plan   1. Mild recurrent major  depression (HCC)  - buPROPion (WELLBUTRIN XL) 150 MG 24 hr tablet; Take 1 tablet (150 mg total) by mouth daily.  Dispense: 90 tablet; Refill: 0  2. Flu vaccine need  refused  3. Insomnia, unspecified type  She will call for a refill if it works for her, may go up to 100 mg per night  - traZODone (DESYREL) 50 MG tablet; Take 0.5-1 tablets (25-50 mg total) by mouth at bedtime as needed for sleep.  Dispense: 30 tablet; Refill: 0

## 2017-07-24 NOTE — Patient Instructions (Signed)
You can go up to 100 mg of Trazodone at night for sleep, when you send me a message please tell me what dose you are taking

## 2017-08-05 ENCOUNTER — Encounter: Payer: Self-pay | Admitting: Obstetrics and Gynecology

## 2017-08-05 ENCOUNTER — Ambulatory Visit (INDEPENDENT_AMBULATORY_CARE_PROVIDER_SITE_OTHER): Payer: BLUE CROSS/BLUE SHIELD | Admitting: Obstetrics and Gynecology

## 2017-08-05 VITALS — BP 108/80 | HR 85 | Ht 64.0 in | Wt 190.0 lb

## 2017-08-05 DIAGNOSIS — Z3202 Encounter for pregnancy test, result negative: Secondary | ICD-10-CM | POA: Diagnosis not present

## 2017-08-05 DIAGNOSIS — Z1151 Encounter for screening for human papillomavirus (HPV): Secondary | ICD-10-CM

## 2017-08-05 DIAGNOSIS — Z30014 Encounter for initial prescription of intrauterine contraceptive device: Secondary | ICD-10-CM

## 2017-08-05 DIAGNOSIS — R87810 Cervical high risk human papillomavirus (HPV) DNA test positive: Secondary | ICD-10-CM | POA: Diagnosis not present

## 2017-08-05 DIAGNOSIS — N926 Irregular menstruation, unspecified: Secondary | ICD-10-CM | POA: Diagnosis not present

## 2017-08-05 DIAGNOSIS — Z01419 Encounter for gynecological examination (general) (routine) without abnormal findings: Secondary | ICD-10-CM | POA: Diagnosis not present

## 2017-08-05 DIAGNOSIS — Z124 Encounter for screening for malignant neoplasm of cervix: Secondary | ICD-10-CM | POA: Diagnosis not present

## 2017-08-05 DIAGNOSIS — R87612 Low grade squamous intraepithelial lesion on cytologic smear of cervix (LGSIL): Secondary | ICD-10-CM | POA: Diagnosis not present

## 2017-08-05 LAB — POCT URINE PREGNANCY: Preg Test, Ur: NEGATIVE

## 2017-08-05 NOTE — Progress Notes (Addendum)
HPI:      [name] is a 32 y.o. G0P0000 who LMP was Patient's last menstrual period was 06/19/2017 (exact date)., presents today for her annual examination. Her menses are monthly, lasting 4-5 days. Dysmenorrhea-mild, improved with ibup. She does not have intermenstrual bleeding.  Sex activity: single partner, condoms. She would like another IUD. Tried nuvaring but had spotting for a few days initially so removed it. Didn't want to be affected with bleeding.   Last Pap: 08/02/16  Results were: neg/POS HPV DNA. 08/02/15--LGSIL - encompassing HPV,mild dysplasia,CIN I. Colpo 4/17 with neg ECC. Repeat pap due today.  There is no FH of breast cancer. There is no FH of ovarian cancer. The patient does do self-breast exams.  Tobacco use: The patient denies current or previous tobacco use. Alcohol use: 2-3 drinks daily Exercise: moderately active  She does get adequate calcium and Vitamin D in her diet.   Past Medical History:  Diagnosis Date  . Anemia   . Depression   . History of Papanicolaou smear of cervix 10/28/12; 08/02/15   NEG; LGSIL, HPV  . Immunization, viral disease    GARDASIL COMPLETED  . Vitamin D deficiency 07/2011    Past Surgical History:  Procedure Laterality Date  . ESOPHAGOGASTRODUODENOSCOPY  2008   WNL    Family History  Problem Relation Age of Onset  . Skin cancer Mother 6540       MELANOMA  . Hypertension Mother   . Endometrial cancer Mother        on radiation treatments  . Uterine cancer Mother   . Lung cancer Maternal Grandmother 80  . Uterine cancer Maternal Grandmother   . Hypertension Father   . Stroke Father   . Heart attack Maternal Grandfather   . Heart attack Paternal Grandmother   . Heart attack Paternal Grandfather     Social History   Socioeconomic History  . Marital status: Single    Spouse name: Not on file  . Number of children: 0  . Years of education: 1314  . Highest education level: Associate degree:  occupational, Scientist, product/process developmenttechnical, or vocational program  Occupational History  . 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 Financial resource strain: Not very hard  . Food insecurity:    Worry: Never true    Inability: Never true  . Transportation needs:    Medical: No    Non-medical: No  Tobacco Use  . Smoking status: Never Smoker  . Smokeless tobacco: Never Used  Substance and Sexual Activity  . Alcohol use: Yes    Alcohol/week: 1.8 oz    Types: 3 Glasses of wine per week  . Drug use: No  . Sexual activity: Yes    Partners: Male    Birth control/protection: Condom, Inserts    Comment: Nuvaring  Lifestyle  . Physical activity:    Days per week: 3 days    Minutes per session: 60 min  . Stress: Very much  Relationships  . Social connections:    Talks on phone: More than three times a week    Gets together: More than three times a week    Attends religious service: Never    Active member of club or organization: Yes    Attends meetings of clubs or organizations: More than 4 times per year    Relationship status: Divorced  . Intimate partner violence:    Fear of current or ex partner: No  Emotionally abused: No    Physically abused: No    Forced sexual activity: No  Other Topics Concern  . 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 BIOTIN SL Place 10,000 each under the tongue daily.    Marland Kitchen buPROPion (WELLBUTRIN XL) 150 MG 24 hr tablet Take 1 tablet (150 mg total) by mouth daily. 90 tablet 0  . Echinacea 400 MG CAPS Take 1 capsule by mouth as needed.    . Prenatal Vit-Fe Fumarate-FA (PRENATAL  MULTIVITAMIN) TABS tablet Take 1 tablet by mouth daily at 12 noon.    . vitamin C (ASCORBIC ACID) 500 MG tablet Take 1,000 mg by mouth as needed.    . traZODone (DESYREL) 50 MG tablet Take 0.5-1 tablets (25-50 mg total) by mouth at bedtime as needed for sleep. (Patient not taking: Reported on 08/05/2017) 30 tablet 0   No current facility-administered medications on file prior to visit.     ROS:  Review of Systems  Constitutional: Negative for fatigue, fever and unexpected weight change.  Respiratory: Negative for cough, shortness of breath and wheezing.   Cardiovascular: Negative for chest pain, palpitations and leg swelling.  Gastrointestinal: Negative for blood in stool, constipation, diarrhea, nausea and vomiting.  Endocrine: Negative for cold intolerance, heat intolerance and polyuria.  Genitourinary: Negative for dyspareunia, dysuria, flank pain, frequency, genital sores, hematuria, menstrual problem, pelvic pain, urgency, vaginal bleeding, vaginal discharge and vaginal pain.  Musculoskeletal: Negative for back pain, joint swelling and myalgias.  Skin: Negative for rash.  Neurological: Negative for dizziness, syncope, light-headedness, numbness and headaches.  Hematological: Negative for adenopathy.  Psychiatric/Behavioral: Positive for agitation and dysphoric mood. Negative for confusion, sleep disturbance and suicidal ideas. The patient is not nervous/anxious.      Objective: BP 108/80   Pulse 85   Ht 5\' 4"  (1.626 m)   Wt 190 lb (86.2 kg)   LMP 06/19/2017 (Exact Date)   BMI 32.61 kg/m    Physical Exam  Constitutional: She is oriented to person, place, and time. She appears well-developed and well-nourished.  Genitourinary: Vagina normal and uterus normal. There is no rash or tenderness on the right labia. There is no rash or tenderness on the left labia. No erythema or tenderness in the vagina. No vaginal discharge found. Right adnexum does not display mass and does not  display tenderness. Left adnexum does not display mass and does not display tenderness. Cervix does not exhibit motion tenderness or polyp. Uterus is not enlarged or tender.  Neck: Normal range of motion. No thyromegaly present.  Cardiovascular: Normal rate, regular rhythm and normal heart sounds.  No murmur heard. Pulmonary/Chest: Effort normal and breath sounds normal. Right breast exhibits no mass, no nipple discharge, no skin change and no tenderness. Left breast exhibits no mass, no nipple discharge, no skin change and no tenderness.  Abdominal: Soft. There is no tenderness. There is no guarding.  Musculoskeletal: Normal range of motion.  Neurological: She is alert and oriented to person, place, and time. No cranial nerve deficit.  Psychiatric: She has a normal mood and affect. Her  behavior is normal.  Vitals reviewed.   Results: Results for orders placed or performed in visit on 08/05/17 (from the past 24 hour(s))  POCT urine pregnancy     Status: Normal   Collection Time: 08/05/17  1:47 PM  Result Value Ref Range   Preg Test, Ur Negative Negative    Assessment/Plan: Encounter for annual routine gynecological examination  Cervical cancer screening - Plan: IGP, Aptima HPV  Screening for HPV (human papillomavirus) - Plan: IGP, Aptima HPV  Cervical high risk human papillomavirus (HPV) DNA test positive - Will call pt with pap results.   Late menses - Neg UPT. Reassurance.  - Plan: POCT urine pregnancy  Encounter for initial prescription of intrauterine contraceptive device (IUD) - RTO with menses for Mirena insertion. NSAIDs.         GYN counsel adequate intake of calcium and vitamin D, diet and exercise     F/U  Return in about 1 year (around 08/06/2018).  Taiwan Talcott B. [place] Lasecki, PA-C 08/05/2017 3:40 PM

## 2017-08-05 NOTE — Patient Instructions (Signed)
I value your feedback and entrusting us with your care. If you get a [facility] patient survey, I would appreciate you taking the time to let us know about your experience today. Thank you! 

## 2017-08-07 LAB — IGP, APTIMA HPV
HPV Aptima: POSITIVE — AB
PAP SMEAR COMMENT: 0

## 2017-08-08 ENCOUNTER — Telehealth: Payer: Self-pay | Admitting: Obstetrics and Gynecology

## 2017-08-08 NOTE — Telephone Encounter (Signed)
4/8 at 2:10 with [facility] for mirena

## 2017-08-09 NOTE — Telephone Encounter (Signed)
Mirena reserved for this patient. 

## 2017-08-12 ENCOUNTER — Ambulatory Visit: Payer: BLUE CROSS/BLUE SHIELD | Admitting: Obstetrics and Gynecology

## 2017-08-12 ENCOUNTER — Encounter: Payer: Self-pay | Admitting: Obstetrics and Gynecology

## 2017-08-12 VITALS — BP 118/76 | HR 80 | Ht 64.0 in | Wt 189.0 lb

## 2017-08-12 DIAGNOSIS — Z30430 Encounter for insertion of intrauterine contraceptive device: Secondary | ICD-10-CM | POA: Diagnosis not present

## 2017-08-12 MED ORDER — LEVONORGESTREL 20 MCG/24HR IU IUD: 1.0000 | INTRAUTERINE_SYSTEM | Freq: Once | INTRAUTERINE | 0 refills | Status: DC

## 2017-08-12 NOTE — Progress Notes (Signed)
   Chief Complaint  Patient presents with  . Contraception    Mirena Insertion     IUD PROCEDURE NOTE:  Briana Reed is a 32 y.o. G0P0000 here for Mirena  IUD insertion for contraception. Had one in the past and would like another. Didn't like nuvaring. Annual 08/05/17 with LGSIL on pap. Has colpo sched 5/19.   BP 118/76 (BP Location: Left Arm, Patient Position: Sitting, Cuff Size: Normal)   Pulse 80   Ht 5\' 4"  (1.626 m)   Wt 189 lb (85.7 kg)   LMP 08/09/2017   BMI 32.44 kg/m   IUD Insertion Procedure Note Patient identified, informed consent performed, consent signed.   Discussed risks of irregular bleeding, cramping, infection, malpositioning or misplacement of the IUD outside the uterus which may require further procedure such as laparoscopy, risk of failure <1%. Time out was performed.    Speculum placed in the vagina.  Cervix visualized.  Cleaned with Betadine x 2.  Grasped anteriorly with a single tooth tenaculum.  Uterus sounded to 7.5 cm.   IUD placed per manufacturer's recommendations.  Strings trimmed to 3 cm. Tenaculum was removed, good hemostasis noted.  Patient tolerated procedure well.   ASSESSMENT:  Encounter for insertion of intrauterine contraceptive device (IUD) - Plan: levonorgestrel (MIRENA, 52 MG,) 20 MCG/24HR IUD   Meds ordered this encounter  Medications  . levonorgestrel (MIRENA, 52 MG,) 20 MCG/24HR IUD    Sig: 1 Intra Uterine Device (1 each total) by Intrauterine route once for 1 dose.    Dispense:  1 Intra Uterine Device    Refill:  0    Order Specific Question:   Supervising Provider    Answer:   Nadara MustardHARRIS, ROBERT P [[id number]]    Plan:  Patient was given post-procedure instructions.  She was advised to have backup contraception for one week.   Call if you are having increasing pain, cramps or bleeding or if you have a fever greater than 100.4 degrees F., shaking chills, nausea or vomiting. Patient was also asked to check IUD strings periodically  and follow up in 4 weeks for IUD check.  Return in about 1 month (around 09/09/2017) for IUD f/u with colpo appt.  Alicia B. Copland, PA-C 08/12/2017 2:41 PM

## 2017-08-12 NOTE — Patient Instructions (Signed)
I value your feedback and entrusting us with your care. If you get a [facility] patient survey, I would appreciate you taking the time to let us know about your experience today. Thank you!  [facility] [phone]  Instructions after IUD insertion  Most women experience no significant problems after insertion of an IUD, however minor cramping and spotting for a few days is common. Cramps may be treated with ibuprofen 800mg every 8 hours or Tylenol 650 mg every 4 hours. Contact [facility] immediately if you experience any of the following symptoms during the next week: temperature >99.6 degrees, worsening pelvic pain, abdominal pain, fainting, unusually heavy vaginal bleeding, foul vaginal discharge, or if you think you have expelled the IUD.  Nothing inserted in the vagina for 48 hours. You will be scheduled for a follow up visit in approximately four weeks.  You should check monthly to be sure you can feel the IUD strings in the upper vagina. If you are having a monthly period, try to check after each period. If you cannot feel the IUD strings,  contact [facility] immediately so we can do an exam to determine if the IUD has been expelled.   Please use backup protection until we can confirm the IUD is in place.  Call [facility] if you are exposed to or diagnosed with a sexually transmitted infection, as we will need to discuss whether it is safe for you to continue using an IUD.   

## 2017-08-30 DIAGNOSIS — F411 Generalized anxiety disorder: Secondary | ICD-10-CM | POA: Diagnosis not present

## 2017-09-16 ENCOUNTER — Ambulatory Visit: Payer: BLUE CROSS/BLUE SHIELD | Admitting: Obstetrics & Gynecology

## 2017-09-18 DIAGNOSIS — F411 Generalized anxiety disorder: Secondary | ICD-10-CM | POA: Diagnosis not present

## 2017-09-25 ENCOUNTER — Encounter: Payer: Self-pay | Admitting: Nurse Practitioner

## 2017-09-25 ENCOUNTER — Ambulatory Visit: Payer: BLUE CROSS/BLUE SHIELD | Admitting: Nurse Practitioner

## 2017-09-25 VITALS — BP 122/84 | HR 82 | Temp 98.8°F | Resp 18 | Ht 65.0 in | Wt 186.8 lb

## 2017-09-25 DIAGNOSIS — J020 Streptococcal pharyngitis: Secondary | ICD-10-CM | POA: Diagnosis not present

## 2017-09-25 DIAGNOSIS — B373 Candidiasis of vulva and vagina: Secondary | ICD-10-CM | POA: Diagnosis not present

## 2017-09-25 DIAGNOSIS — J029 Acute pharyngitis, unspecified: Secondary | ICD-10-CM

## 2017-09-25 DIAGNOSIS — B3731 Acute candidiasis of vulva and vagina: Secondary | ICD-10-CM

## 2017-09-25 LAB — POCT RAPID STREP A (OFFICE): Rapid Strep A Screen: POSITIVE — AB

## 2017-09-25 MED ORDER — MAGIC MOUTHWASH W/LIDOCAINE: 5.0000 mL | Freq: Two times a day (BID) | ORAL | 1 refills | Status: DC

## 2017-09-25 MED ORDER — FLUCONAZOLE 150 MG PO TABS: 150.0000 mg | ORAL_TABLET | Freq: Once | ORAL | 0 refills | Status: AC

## 2017-09-25 MED ORDER — AMOXICILLIN-POT CLAVULANATE 875-125 MG PO TABS: 1.0000 | ORAL_TABLET | Freq: Two times a day (BID) | ORAL | 0 refills | Status: DC

## 2017-09-25 MED ORDER — AMOXICILLIN 500 MG PO CAPS: 500.0000 mg | ORAL_CAPSULE | Freq: Two times a day (BID) | ORAL | 0 refills | Status: AC

## 2017-09-25 NOTE — Progress Notes (Addendum)
Name: Briana Reed   MRN: [id number]    DOB: [birth year]   Date:09/25/2017       Progress Note  Subjective  Chief Complaint  Chief Complaint  Patient presents with  . Sore Throat    HPI  Patient endorses sore throat ongoing for 2 days states last night was worsening, endorses body aches, pain with swallowing. Pt endorses mild subjective fever- not sure if related to sun burn. Patient has a history of strep. Endorses cough. Mild nasal congestion ongoing during allergy season  Patient was taking allegra for allergies but stopped 2 weeks ago due to headaches. Headaches resolved.  Patient Active Problem List   Diagnosis Date Noted  . Cervical high risk human papillomavirus (HPV) DNA test positive 08/05/2017  . ASCUS with positive high risk HPV cervical 06/12/2017    Past Medical History:  Diagnosis Date  . Anemia   . Depression   . History of Papanicolaou smear of cervix 10/28/12; 08/02/15   NEG; LGSIL, HPV  . Immunization, viral disease    GARDASIL COMPLETED  . Vitamin D deficiency 07/2011    Past Surgical History:  Procedure Laterality Date  . ESOPHAGOGASTRODUODENOSCOPY  2008   WNL    Social History   Tobacco Use  . Smoking status: Never Smoker  . Smokeless tobacco: Never Used  Substance Use Topics  . Alcohol use: Yes    Alcohol/week: 1.8 oz    Types: 3 Glasses of wine per week     Current Outpatient Medications:  .  BIOTIN SL, Place 10,000 each under the tongue daily., Disp: , Rfl:  .  buPROPion (WELLBUTRIN XL) 150 MG 24 hr tablet, Take 1 tablet (150 mg total) by mouth daily., Disp: 90 tablet, Rfl: 0 .  cholecalciferol (VITAMIN D) 400 units TABS tablet, Take 5,000 Units by mouth., Disp: , Rfl:  .  Echinacea 400 MG CAPS, Take 1 capsule by mouth as needed., Disp: , Rfl:  .  Melatonin 3-10 MG TABS, Take 3 mg/day by mouth at bedtime., Disp: , Rfl:  .  Prenatal Vit-Fe Fumarate-FA (PRENATAL MULTIVITAMIN) TABS tablet, Take 1 tablet by mouth daily at 12 noon., Disp: ,  Rfl:  .  vitamin C (ASCORBIC ACID) 500 MG tablet, Take 1,000 mg by mouth as needed., Disp: , Rfl:  .  levonorgestrel (MIRENA, 52 MG,) 20 MCG/24HR IUD, 1 Intra Uterine Device (1 each total) by Intrauterine route once for 1 dose., Disp: 1 Intra Uterine Device, Rfl: 0 .  traZODone (DESYREL) 50 MG tablet, Take 0.5-1 tablets (25-50 mg total) by mouth at bedtime as needed for sleep. (Patient not taking: Reported on 08/05/2017), Disp: 30 tablet, Rfl: 0  No Known Allergies  ROS    No other specific complaints in a complete review of systems (except as listed in HPI above).  Objective  Vitals:   09/25/17 1558  BP: 122/84  Pulse: 82  Resp: 18  Temp: 98.8 F (37.1 C)  TempSrc: Oral  SpO2: 97%  Weight: 186 lb 12.8 oz (84.7 kg)  Height:  (1.651 m)     Body mass index is 31.09 kg/m.  Nursing Note and Vital Signs reviewed.  Physical Exam   Constitutional: Patient appears well-developed and well-nourished.No distress.  HEENT: head atraumatic, normocephalic, pupils equal and reactive to light, TM's without erythema or bulging,  no maxillary or frontal sinus tenderness , neck supple without lymphadenopathy, tonsils  3+ with exudate, no nasal discharge Cardiovascular: Normal rate, regular rhythm, S1/S2 present.  No murmur or  rub heard.  Pulmonary/Chest: Effort normal and breath sounds clear. No respiratory distress or retractions. Psychiatric: Patient has a normal mood and affect. behavior is normal. Judgment and thought content normal.  No results found for this or any previous visit (from the past 72 hour(s)).  Assessment & Plan 1. Sore throat - POCT rapid strep A - magic mouthwash w/lidocaine SOLN; Take 5 mLs by mouth 2 (two) times daily.  Dispense: 120 mL; Refill: 1 - amoxicillin (AMOXIL) 500 MG capsule; Take 1 capsule (500 mg total) by mouth 2 (two) times daily for 10 days.  Dispense: 20 capsule; Refill: 0  2. Strep pharyngitis - amoxicillin (AMOXIL) 500 MG capsule; Take 1  capsule (500 mg total) by mouth 2 (two) times daily for 10 days.  Dispense: 20 capsule; Refill: 0  Sent diflucan as patient sts typically gets yeast infection when taking abx will reserve to use if symptomatic.   -Red flags and when to present for emergency care or RTC including fever >101.48F, chest pain, shortness of breath, new/worsening/un-resolving symptoms,  reviewed with patient at time of visit. Follow up and care instructions discussed and provided in AVS.  ------------------------------------------------ I have reviewed this encounter including the documentation in this note and/or discussed this patient with the provider, Sharyon Cable DNP AGNP-C. I am certifying that I agree with the content of this note as supervising physician. Baruch Gouty, MD [facility] 09/26/2017, 6:03 PM

## 2017-09-25 NOTE — Patient Instructions (Signed)

## 2017-10-07 ENCOUNTER — Encounter: Payer: Self-pay | Admitting: Family Medicine

## 2017-10-08 ENCOUNTER — Other Ambulatory Visit: Payer: Self-pay

## 2017-10-08 ENCOUNTER — Encounter (HOSPITAL_COMMUNITY): Payer: Self-pay | Admitting: Emergency Medicine

## 2017-10-08 ENCOUNTER — Ambulatory Visit (HOSPITAL_COMMUNITY)
Admission: EM | Admit: 2017-10-08 | Discharge: 2017-10-08 | Disposition: A | Discharge status: Home / Self Care | Payer: BLUE CROSS/BLUE SHIELD | Attending: Emergency Medicine | Admitting: Emergency Medicine

## 2017-10-08 DIAGNOSIS — J020 Streptococcal pharyngitis: Secondary | ICD-10-CM | POA: Diagnosis not present

## 2017-10-08 LAB — POCT RAPID STREP A: Streptococcus, Group A Screen (Direct): POSITIVE — AB

## 2017-10-08 MED ORDER — PENICILLIN V POTASSIUM 500 MG PO TABS
500.0000 mg | ORAL_TABLET | Freq: Two times a day (BID) | ORAL | 0 refills | Status: AC
Start: 2017-10-08 — End: 2017-10-18

## 2017-10-08 MED ORDER — IBUPROFEN 600 MG PO TABS: 600.0000 mg | ORAL_TABLET | Freq: Four times a day (QID) | ORAL | 0 refills | Status: DC | PRN

## 2017-10-08 NOTE — ED Triage Notes (Signed)
[facility] sore [facility]. The patient reported that she was diagnosed and treated for strep throat on 09/25/2017. The patient reported that 2 days after completing the antibiotic she started to have symptoms again.

## 2017-10-08 NOTE — ED Provider Notes (Signed)
HPI  SUBJECTIVE:  Patient reports sore throat starting 2 days ago. Sx worse with swallowing.  Sx better with ibuprofen, salt water gargles, hot tea.  No fever + Swollen neck glands   No neck stiffness  + Mild cough, nasal congestion, rhinorrhea, postnasal drip + Myalgias + Headache No Rash     No Recent Strep Exposure No Abdominal Pain No reflux sxs No Allergy sxs  No Breathing difficulty, voice changes, sensation of throat swelling shut No Drooling No Trismus + abx in past month-finished 10 days of amoxicillin for strep throat 5 days ago.  + antipyretic in past 4-6 hrs-ibuprofen Past medical history of GERD, recurrent strep pharyngitis as a child.  No history of diabetes, hypertension.  LMP: IUD.  Denies possibility being pregnant.  ZOX:WRUEAVPMD:Sowles, Danna HeftyKrichna, MD    Past Medical History:  Diagnosis Date  . Anemia   . Depression   . History of Papanicolaou smear of cervix 10/28/12; 08/02/15   NEG; LGSIL, HPV  . Immunization, viral disease    GARDASIL COMPLETED  . Vitamin D deficiency 07/2011    Past Surgical History:  Procedure Laterality Date  . ESOPHAGOGASTRODUODENOSCOPY  2008   WNL    Family History  Problem Relation Age of Onset  . Skin cancer Mother [id number]       MELANOMA  . Hypertension Mother   . Endometrial cancer Mother        on radiation treatments  . Uterine cancer Mother [id number]  . Lung cancer Maternal Grandmother 80  . Uterine cancer Maternal Grandmother 65  . Hypertension Father   . Stroke Father   . Heart attack Maternal Grandfather   . Heart attack Paternal Grandmother   . Heart attack Paternal Grandfather     Social History   Tobacco Use  . Smoking status: Never Smoker  . Smokeless tobacco: Never Used  Substance Use Topics  . Alcohol use: Yes    Alcohol/week: 1.8 oz    Types: 3 Glasses of wine per week  . Drug use: No    No current facility-administered medications for this encounter.   Current Outpatient Medications:  .  BIOTIN SL, Place  10,000 each under the tongue daily., Disp: , Rfl:  .  buPROPion (WELLBUTRIN XL) 150 MG 24 hr tablet, Take 1 tablet (150 mg total) by mouth daily., Disp: 90 tablet, Rfl: 0 .  cholecalciferol (VITAMIN D) 400 units TABS tablet, Take 5,000 Units by mouth., Disp: , Rfl:  .  Echinacea 400 MG CAPS, Take 1 capsule by mouth as needed., Disp: , Rfl:  .  ibuprofen (ADVIL,MOTRIN) 600 MG tablet, Take 1 tablet (600 mg total) by mouth every 6 (six) hours as needed., Disp: 30 tablet, Rfl: 0 .  levonorgestrel (MIRENA, 52 MG,) 20 MCG/24HR IUD, 1 Intra Uterine Device (1 each total) by Intrauterine route once for 1 dose., Disp: 1 Intra Uterine Device, Rfl: 0 .  magic mouthwash w/lidocaine SOLN, Take 5 mLs by mouth 2 (two) times daily., Disp: 120 mL, Rfl: 1 .  Melatonin 3-10 MG TABS, Take 3 mg/day by mouth at bedtime., Disp: , Rfl:  .  penicillin v potassium (VEETID) 500 MG tablet, Take 1 tablet (500 mg total) by mouth 2 (two) times daily for 10 days., Disp: 20 tablet, Rfl: 0 .  Prenatal Vit-Fe Fumarate-FA (PRENATAL MULTIVITAMIN) TABS tablet, Take 1 tablet by mouth daily at 12 noon., Disp: , Rfl:  .  vitamin C (ASCORBIC ACID) 500 MG tablet, Take 1,000 mg by mouth as needed., Disp: ,  Rfl:   No Known Allergies   ROS  As noted in HPI.   Physical Exam  BP 124/83 (BP Location: Right Arm)   Pulse 87   Temp 98.6 F (37 C) (Oral)   Resp 16   SpO2 99%   Constitutional: Well developed, well nourished, no acute distress Eyes:  EOMI, conjunctiva normal bilaterally HENT: Normocephalic, atraumatic,mucus membranes moist. - nasal congestion + erythematous oropharynx + very enlarged erythematous tonsils + exudates. Uvula midline.  Respiratory: Normal inspiratory effort Cardiovascular: Normal rate, no murmurs, rubs, gallops GI: nondistended, nontender. No appreciable splenomegaly skin: No rash, skin intact Lymph: + Anterior cervical LN.  No posterior cervical lymphadenopathy Musculoskeletal: no deformities Neurologic:  Alert & oriented x 3, no focal neuro deficits Psychiatric: Speech and behavior appropriate. ED Course   Medications - No data to display  Orders Placed This Encounter  Procedures  . POCT rapid strep A [facility])    Standing Status:   Standing    Number of Occurrences:   1    Results for orders placed or performed during the hospital encounter of 10/08/17 (from the past 24 hour(s))  POCT rapid strep A [facility])     Status: Abnormal   Collection Time: 10/08/17  6:00 PM  Result Value Ref Range   Streptococcus, Group A Screen (Direct) POSITIVE (A) NEGATIVE   No results found.  ED Clinical Impression  Strep pharyngitis   ED Assessment/Plan  Rapid strep positive.  Dexamethasone 10 mg IM x1 because of the tonsillar swelling.  Sending home with penicillin for 10 days. Home with ibuprofen, Tylenol, Benadryl/Maalox mixture. Patient to followup with PMD when necessary.  Discussed labs,  MDM, plan and followup with patient. Discussed sn/sx that should prompt return to the ED. patient agrees with plan.   Meds ordered this encounter  Medications  . ibuprofen (ADVIL,MOTRIN) 600 MG tablet    Sig: Take 1 tablet (600 mg total) by mouth every 6 (six) hours as needed.    Dispense:  30 tablet    Refill:  0  . penicillin v potassium (VEETID) 500 MG tablet    Sig: Take 1 tablet (500 mg total) by mouth 2 (two) times daily for 10 days.    Dispense:  20 tablet    Refill:  0     *This clinic note was created using Scientist, clinical (histocompatibility and immunogenetics). Therefore, there may be occasional mistakes despite careful proofreading.    Domenick Gong, MD 10/08/17 [phone]

## 2017-10-08 NOTE — Discharge Instructions (Addendum)
1 gram of Tylenol and 600 mg ibuprofen together 3-4 times a day as needed for pain.  Make sure you drink plenty of extra fluids.  Some people find salt water gargles and  Traditional Medicinal's "Throat Coat" tea helpful. Take 5 mL of liquid Benadryl and 5 mL of Maalox. Mix it together, and then hold it in your mouth for as long as you can and then swallow. You may do this 4 times a day.   ° °Go to www.goodrx.com to look up your medications. This will give you a list of where you can find your prescriptions at the most affordable prices. Or ask the pharmacist what the cash price is, or if they have any other discount programs available to help make your medication more affordable. This can be less expensive than what you would pay with insurance.  ° °

## 2017-10-14 ENCOUNTER — Ambulatory Visit (INDEPENDENT_AMBULATORY_CARE_PROVIDER_SITE_OTHER): Payer: BLUE CROSS/BLUE SHIELD | Admitting: Obstetrics & Gynecology

## 2017-10-14 ENCOUNTER — Encounter: Payer: Self-pay | Admitting: Obstetrics & Gynecology

## 2017-10-14 VITALS — BP 110/60 | Ht 64.0 in | Wt 190.0 lb

## 2017-10-14 DIAGNOSIS — R87612 Low grade squamous intraepithelial lesion on cytologic smear of cervix (LGSIL): Secondary | ICD-10-CM | POA: Insufficient documentation

## 2017-10-14 DIAGNOSIS — N870 Mild cervical dysplasia: Secondary | ICD-10-CM | POA: Diagnosis not present

## 2017-10-14 NOTE — Patient Instructions (Signed)

## 2017-10-14 NOTE — Progress Notes (Signed)
Referring Provider:  [facility]  HPI:  Briana Reed is a 32 y.o.  G0P0000  who presents today for evaluation and management of abnormal cervical cytology.    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  OB History  Gravida Para Term Preterm AB Living  0 0 0 0 0 0  SAB TAB Ectopic Multiple Live Births  0 0 0 0 0    Past Medical History:  Diagnosis Date  . Anemia   . Depression   . History of Papanicolaou smear of cervix 10/28/12; 08/02/15   NEG; LGSIL, HPV  . Immunization, viral disease    GARDASIL COMPLETED  . Vitamin D deficiency 07/2011    Past Surgical History:  Procedure Laterality Date  . ESOPHAGOGASTRODUODENOSCOPY  2008   WNL    SOCIAL HISTORY: Social History   Substance and Sexual Activity  Alcohol Use Yes  . Alcohol/week: 1.8 oz  . Types: 3 Glasses of wine per week   Social History   Substance and Sexual Activity  Drug Use No     Family History  Problem Relation Age of Onset  . Skin cancer Mother 5940       MELANOMA  . Hypertension Mother   . Endometrial cancer Mother        on radiation treatments  . Uterine cancer Mother 3765  . Lung cancer Maternal Grandmother 80  . Uterine cancer Maternal Grandmother 65  . Hypertension Father   . Stroke Father   . Heart attack Maternal Grandfather   . Heart attack Paternal Grandmother   . Heart attack Paternal Grandfather     ALLERGIES:  Patient has no known allergies.  Current Outpatient Medications on File Prior to Visit  Medication Sig Dispense Refill  . BIOTIN SL Place 10,000 each under the tongue daily.    Marland Kitchen. buPROPion (WELLBUTRIN XL) 150 MG 24 hr tablet Take 1 tablet (150 mg total) by mouth daily. 90 tablet 0  . cholecalciferol (VITAMIN D) 400 units TABS tablet Take 5,000 Units by mouth.    . Echinacea 400 MG CAPS Take 1 capsule by mouth as needed.    Marland Kitchen. ibuprofen (ADVIL,MOTRIN) 600 MG tablet Take 1 tablet (600 mg total) by mouth every 6  (six) hours as needed. 30 tablet 0  . levonorgestrel (MIRENA, 52 MG,) 20 MCG/24HR IUD 1 Intra Uterine Device (1 each total) by Intrauterine route once for 1 dose. 1 Intra Uterine Device 0  . magic mouthwash w/lidocaine SOLN Take 5 mLs by mouth 2 (two) times daily. 120 mL 1  . Melatonin 3-10 MG TABS Take 3 mg/day by mouth at bedtime.    . penicillin v potassium (VEETID) 500 MG tablet Take 1 tablet (500 mg total) by mouth 2 (two) times daily for 10 days. 20 tablet 0  . Prenatal Vit-Fe Fumarate-FA (PRENATAL MULTIVITAMIN) TABS tablet Take 1 tablet by mouth daily at 12 noon.    . vitamin C (ASCORBIC ACID) 500 MG tablet Take 1,000 mg by mouth as needed.     No current facility-administered medications on file prior to visit.     Physical Exam: -Vitals:  BP 110/60   Ht 5\' 4"  (1.626 m)   Wt 190 lb (86.2 kg)   BMI 32.61 kg/m  GEN: WD, WN, NAD.  A+ O x 3, good mood and affect. ABD:  NT, ND.  Soft, no masses.  No hernias noted.   Pelvic:  Vulva: Normal appearance.  No lesions.  Vagina: No lesions or abnormalities noted.  Support: Normal pelvic support.  Urethra No masses tenderness or scarring.  Meatus Normal size without lesions or prolapse.  Cervix: See below.  Anus: Normal exam.  No lesions.  Perineum: Normal exam.  No lesions.        Bimanual   Uterus: Normal size.  Non-tender.  Mobile.  AV.  Adnexae: No masses.  Non-tender to palpation.  Cul-de-sac: Negative for abnormality.   PROCEDURE: 1.  Urine Pregnancy Test:  negative 2.  Colposcopy performed with 4% acetic acid after verbal consent obtained                                         -Aceto-white Lesions Location(s): none.              -Biopsy performed at 6 o'clock               -ECC indicated and performed: Yes.       -Biopsy sites made hemostatic with pressure, AgNO3, and/or Monsel's solution   -Satisfactory colposcopy: Yes.      -Evidence of Invasive cervical CA :  NO  ASSESSMENT:  Briana Reed is a 32 y.o. G0P0000  here for  1. LGSIL on Pap smear of cervix   .  PLAN: 1.  I discussed the grading system of pap smears and HPV high risk viral types.  We will discuss and base management after colpo results return. 2. Follow up PAP 6 months, vs intervention if high grade dysplasia identified 3. Treatment of persistantly abnormal PAP smears and cervical dysplasia, even mild, is discussed w pt today in detail, as well as the pros and cons of Cryo and LEEP procedures. Will consider and discuss after results. 4. Considering CRYO for persistant CIN I or for progression to moderate dysplasia     Annamarie Major, MD, [facility], [facility] 10/14/2017  2:39 PM

## 2017-10-17 LAB — PATHOLOGY

## 2017-10-17 NOTE — Progress Notes (Signed)
Needs PAP in Dec 2019, sch or put on reminmder list

## 2017-10-28 ENCOUNTER — Ambulatory Visit: Payer: BLUE CROSS/BLUE SHIELD | Admitting: Family Medicine

## 2017-10-28 DIAGNOSIS — Z6830 Body mass index (BMI) 30.0-30.9, adult: Secondary | ICD-10-CM | POA: Diagnosis not present

## 2017-10-28 DIAGNOSIS — H1031 Unspecified acute conjunctivitis, right eye: Secondary | ICD-10-CM | POA: Diagnosis not present

## 2017-10-28 DIAGNOSIS — J029 Acute pharyngitis, unspecified: Secondary | ICD-10-CM | POA: Diagnosis not present

## 2017-10-28 DIAGNOSIS — J020 Streptococcal pharyngitis: Secondary | ICD-10-CM | POA: Diagnosis not present

## 2017-10-30 ENCOUNTER — Telehealth: Payer: Self-pay

## 2017-10-30 NOTE — Telephone Encounter (Signed)
Copied from CRM [id number]. Topic: General - Other >> Oct 30, 2017 11:18 AM Tamela OddiMartin, Briana, NT wrote: Reason for CRM: Patient called and states she seen the provider last month for strep throat and since then she has it twice. Patient is going to see an ENT and they need her last OV note faxed.   Dr. Anitra Lauthroussly Fax# [phone].    Is it ok to fax notes to the suggested doctor?

## 2017-10-30 NOTE — Telephone Encounter (Signed)
yes

## 2017-10-31 DIAGNOSIS — J3501 Chronic tonsillitis: Secondary | ICD-10-CM | POA: Diagnosis not present

## 2017-10-31 NOTE — Telephone Encounter (Signed)
Notes has been faxed as requested.  Release ID #[id number]

## 2017-11-01 ENCOUNTER — Ambulatory Visit: Payer: Self-pay | Admitting: Orthopedic Surgery

## 2017-11-01 ENCOUNTER — Ambulatory Visit: Payer: BLUE CROSS/BLUE SHIELD | Admitting: Physician Assistant

## 2017-11-01 ENCOUNTER — Ambulatory Visit (INDEPENDENT_AMBULATORY_CARE_PROVIDER_SITE_OTHER): Payer: BLUE CROSS/BLUE SHIELD

## 2017-11-01 ENCOUNTER — Encounter: Payer: Self-pay | Admitting: Physician Assistant

## 2017-11-01 ENCOUNTER — Other Ambulatory Visit: Payer: Self-pay

## 2017-11-01 VITALS — BP 118/76 | HR 96 | Temp 99.0°F | Resp 18 | Ht 64.17 in | Wt 180.6 lb

## 2017-11-01 DIAGNOSIS — L089 Local infection of the skin and subcutaneous tissue, unspecified: Secondary | ICD-10-CM | POA: Diagnosis not present

## 2017-11-01 DIAGNOSIS — M79645 Pain in left finger(s): Secondary | ICD-10-CM | POA: Diagnosis not present

## 2017-11-01 DIAGNOSIS — M7989 Other specified soft tissue disorders: Secondary | ICD-10-CM | POA: Diagnosis not present

## 2017-11-01 MED ORDER — HYDROCODONE-ACETAMINOPHEN 5-325 MG PO TABS: 1.0000 | ORAL_TABLET | Freq: Three times a day (TID) | ORAL | 0 refills | Status: DC | PRN

## 2017-11-01 NOTE — Progress Notes (Signed)
Briana Briana  MRN: [id number] DOB: December 29, 1985  Subjective:  Briana Briana is a 32 y.o. female seen in office today for a chief complaint of lump on finger. Started 6 days ago under nail after hitting her nail on the bar, causing the nail to lift partially from the skin. Went to urgent care, they said to soak it.  A couple days later, a larger bump developed on the other side of the finger. Has associated pain, swelling, throbbing, and a little numbness sensation.  It is difficult to bend her finger due to the pain. Denies fever, purulent drainage, burning, tingling, chills, nausea, vomiting, and loss of sensation. Denies putting wounded finger in mouth before lesion appeared. Of note, patient is taking clindamycin 3 times a day for strep.  "I just keep getting sick recently, is something wrong with me." No PMH of T2DM, gout, or MRSA. Denies smoking.    Review of Systems  Constitutional: Negative for chills, fatigue, fever and unexpected weight change.  HENT: Positive for sore throat. Negative for mouth sores.   Respiratory: Negative for cough and wheezing.   Cardiovascular: Negative for chest pain and palpitations.  Gastrointestinal: Negative for abdominal pain, nausea and vomiting.  Genitourinary: Negative for genital sores.  Musculoskeletal: Negative for neck pain and neck stiffness.  Skin: Positive for rash.  Neurological: Negative for dizziness, light-headedness and headaches.  Hematological: Negative for adenopathy.  Psychiatric/Behavioral: The patient is nervous/anxious.     Patient Active Problem List   Diagnosis Date Noted  . LGSIL on Pap smear of cervix 10/14/2017  . Cervical high risk human papillomavirus (HPV) DNA test positive 08/05/2017    Current Outpatient Medications on File Prior to Visit  Medication Sig Dispense Refill  . BIOTIN SL Place 10,000 each under the tongue daily.    Marland Kitchen buPROPion (WELLBUTRIN XL) 150 MG 24 hr tablet Take 1 tablet (150 mg total) by mouth  daily. 90 tablet 0  . cholecalciferol (VITAMIN D) 400 units TABS tablet Take 5,000 Units by mouth.    . clindamycin (CLEOCIN) 150 MG capsule Take by mouth.    Marland Kitchen ibuprofen (ADVIL,MOTRIN) 600 MG tablet Take 1 tablet (600 mg total) by mouth every 6 (six) hours as needed. 30 tablet 0  . Melatonin 3-10 MG TABS Take 3 mg/day by mouth at bedtime.    . Prenatal Vit-Fe Fumarate-FA (PRENATAL MULTIVITAMIN) TABS tablet Take 1 tablet by mouth daily at 12 noon.    . tobramycin (TOBREX) 0.3 % ophthalmic solution Administer 2 drops to the right eye Every four (4) hours. for 7 days Use while awake    . vitamin C (ASCORBIC ACID) 500 MG tablet Take 1,000 mg by mouth as needed.    . Echinacea 400 MG CAPS Take 1 capsule by mouth as needed.    Marland Kitchen levonorgestrel (MIRENA, 52 MG,) 20 MCG/24HR IUD 1 Intra Uterine Device (1 each total) by Intrauterine route once for 1 dose. 1 Intra Uterine Device 0  . magic mouthwash w/lidocaine SOLN Take 5 mLs by mouth 2 (two) times daily. (Patient not taking: Reported on 11/01/2017) 120 mL 1   No current facility-administered medications on file prior to visit.     No Known Allergies    Past Medical History:  Diagnosis Date  . Anemia   . Anxiety   . Depression   . GERD (gastroesophageal reflux disease)   . History of Papanicolaou smear of cervix 10/28/12; 08/02/15   NEG; LGSIL, HPV  . Immunization, viral disease  GARDASIL COMPLETED  . Vitamin D deficiency 07/2011   Social History   Socioeconomic History  . Marital status: Single    Spouse name: Not on file  . Number of children: 0  . Years of education: 33  . Highest education level: Associate degree: occupational, Scientist, product/process development, or vocational program  Occupational History  . Occupation: bartender     Comment: Tourist information centre manager   Social Needs  . Financial resource strain: Not very hard  . Food insecurity:    Worry: Never true    Inability: Never true  . Transportation needs:    Medical: No    Non-medical: No  Tobacco  Use  . Smoking status: Never Smoker  . Smokeless tobacco: Never Used  Substance and Sexual Activity  . Alcohol use: Yes    Alcohol/week: 1.8 oz    Types: 3 Glasses of wine per week  . Drug use: No  . Sexual activity: Yes    Partners: Male    Birth control/protection: IUD  Lifestyle  . Physical activity:    Days per week: 3 days    Minutes per session: 60 min  . Stress: Very much  Relationships  . Social connections:    Talks on phone: More than three times a week    Gets together: More than three times a week    Attends religious service: Never    Active member of club or organization: Yes    Attends meetings of clubs or organizations: More than 4 times per year    Relationship status: Divorced  . Intimate partner violence:    Fear of current or ex partner: No    Emotionally abused: No    Physically abused: No    Forced sexual activity: No  Other Topics Concern  . Not on file  Social History Narrative   She lives with boyfriend ( he is a Insurance underwriter) , she works as a Leisure centre manager and is going to school full time to become a Research officer, trade union    Worried about her mother, getting treatment for endometrial cancer   She was married previously from 2013 till 2016 - divorced since 2017   Family History  Problem Relation Age of Onset  . Skin cancer Mother 66       MELANOMA  . Hypertension Mother   . Endometrial cancer Mother        on radiation treatments  . Uterine cancer Mother 7  . Hyperlipidemia Mother   . Lung cancer Maternal Grandmother 80  . Uterine cancer Maternal Grandmother 65  . Hypertension Father   . Stroke Father   . Hyperlipidemia Father   . Heart attack Maternal Grandfather   . Heart attack Paternal Grandmother   . Heart attack Paternal Grandfather     Objective:  BP 118/76 (BP Location: Right Arm, Patient Position: Sitting, Cuff Size: Normal)   Pulse 96   Temp 99 F (37.2 C) (Oral)   Resp 18   Ht 5' 4.17" (1.63 m)   Wt 180 lb 9.6 oz (81.9 kg)   SpO2  98%   BMI 30.83 kg/m   Physical Exam  Constitutional: She is oriented to person, place, and time. She appears well-developed and well-nourished.  Tearful during HPI  HENT:  Head: Normocephalic and atraumatic.  Eyes: Conjunctivae are normal.  Neck: Normal range of motion.  Cardiovascular:  Pulses:      Radial pulses are 2+ on the right side, and 2+ on the left side.  Pulmonary/Chest: Effort normal.  Musculoskeletal:       Left hand: She exhibits decreased range of motion (decreased ROM of 3rd DIP joint due to pain). Normal sensation noted. Normal strength noted.  Neurological: She is alert and oriented to person, place, and time.  Skin: Skin is warm and dry.  ~2cm of fluctuant mass with surrounding erythema noted on dorsal aspect of 3rd digit of left hand just overlying DIP joint.   Small ~0.5 cm fluctuant mass just inferior to left 3rd digit nail.   No fluctuance or tenderness of left 3rd digital pulp.   Unable to access cap refill due nail color.   See image below.   Psychiatric: She has a normal mood and affect.  Vitals reviewed.         Dg Finger Middle Left  Result Date: 11/01/2017 CLINICAL DATA:  Left finger pain and swelling EXAM: LEFT MIDDLE FINGER 2+V COMPARISON:  None. FINDINGS: There is no evidence of fracture or dislocation. There is no evidence of arthropathy or other focal bone abnormality. Soft tissues are unremarkable. IMPRESSION: Negative. Electronically Signed   By: Marlan Palauharles  Clark M.D.   On: 11/01/2017 16:47   PROCEDURE NOTE: I&D of Abscess Risks vs benefits of procedure discussed with patient. Verbal consent obtained. Digital block with 5cc of 2% lidocaine without epinephrine.Sites cleansed with Betadine. Incision of 1 cm was made on dorsal aspect using a 11 blade, discharge of minimal purulent drainage and moderate amounts of serosanguinous fluid.leansed and dressed. Incision of ~0.5 cm was made on palmar aspect and  minimal purulent drainage and moderate  amount of serosanguinous fluid. After care instructions provided. Patient to return to clinic on 1 day for reevaluation.    Assessment and Plan :  1. Finger infection - WOUND CULTURE - Herpes simplex virus culture 2. Finger pain, left - DG Finger Middle Left; Future - HYDROcodone-acetaminophen (NORCO/VICODIN) 5-325 MG tablet; Take 1 tablet by mouth every 8 (eight) hours as needed for moderate pain.  Dispense: 20 tablet; Refill: 0 - WOUND CULTURE - Herpes simplex virus culture  I personally consulted local hand specialist,  Dr. Mina MarbleWeingold, and he recommended I&D in office with possible packing. Plain films of finger negative.  No concerning PE findings suggestive of felon or joint space involvement at this time. Due to lack of purulent drainage during I&D, will send cx for HSV, although hx is not consistent with HSV and is more concerning for bacterial infection. Unable to pack due to shallow depth of wound and fragile skin in affected area. Rec warm water soaks and warm compresses. Keep covered with clean bandages. Change bandages when they become soaked. Ibuprofen as prescribed. May use norco for breakthrough pain.  F/u in 24 hrs for reevaluation. Given strict ED precautions.   Side effects, risks, benefits, and alternatives of the  treatment plan prescribed today were discussed, and patient expressed understanding of the instructions given. No barriers to understanding were identified. Red flags discussed in detail. Pt expressed understanding regarding what to do in case of emergency/urgent symptoms.  Benjiman CoreBrittany Dorene Bruni PA-C  Primary Care at [facility]  [facility] 11/01/2017 3:48 PM

## 2017-11-01 NOTE — Patient Instructions (Addendum)
Follow up tomorrow with Dr. Alwyn RenHopper. Continue taking antibiotics.  Take ibuprofen 600-800mg  every 8 hours. Can use norco for breakthrough pain. This can make you drowsy. Do warm water soaks with soap and water at least 4 times over tonight and tomorrow for 20 min at a time. Use heating pad to affected area 4-5 x tonight for 20 minutes at a time. Keep covered. Change dressing when it becomes soaked.     Incision and Drainage, Care After Refer to this sheet in the next few weeks. These instructions provide you with information about caring for yourself after your procedure. Your health care provider may also give you more specific instructions. Your treatment has been planned according to current medical practices, but problems sometimes occur. Call your health care provider if you have any problems or questions after your procedure. What can I expect after the procedure? After the procedure, it is common to have:  Pain or discomfort around your incision site.  Drainage from your incision.  Follow these instructions at home:  Take over-the-counter and prescription medicines only as told by your health care provider.  If you were prescribed an antibiotic medicine, take it as told by your health care provider.Do not stop taking the antibiotic even if you start to feel better.  Followinstructions from your health care provider about: ? How to take care of your incision. ? When and how you should change your packing and bandage (dressing). Wash your hands with soap and water before you change your dressing. If soap and water are not available, use hand sanitizer. ? When you should remove your dressing.  Do not take baths, swim, or use a hot tub until your health care provider approves.  Keep all follow-up visits as told by your health care provider. This is important.  Check your incision area every day for signs of infection. Check for: ? More redness, swelling, or pain. ? More fluid or  blood. ? Warmth. ? Pus or a bad smell. Contact a health care provider if:  Your cyst or abscess returns.  You have a fever.  You have more redness, swelling, or pain around your incision.  You have more fluid or blood coming from your incision.  Your incision feels warm to the touch.  You have pus or a bad smell coming from your incision. Get help right away if:  You have severe pain or bleeding.  You cannot eat or drink without vomiting.  You have decreased urine output.  You become short of breath.  You have chest pain.  You cough up blood.  The area where the incision and drainage occurred becomes numb or it tingles. This information is not intended to replace advice given to you by your health care provider. Make sure you discuss any questions you have with your health care provider. Document Released: 07/16/2011 Document Revised: 09/23/2015 Document Reviewed: 02/11/2015 Elsevier Interactive Patient Education  2018 [employer]     IF you received an x-ray today, you will receive an invoice from [facility]. Please contact [facility] at [phone] with questions or concerns regarding your invoice.   IF you received labwork today, you will receive an invoice from [facility]. Please contact [facility] at [phone] with questions or concerns regarding your invoice.   Our billing staff will not be able to assist you with questions regarding bills from these companies.  You will be contacted with the lab results as soon as they are available. The fastest way to get your  results is to activate your My Chart account. Instructions are located on the last page of this paperwork. If you have not heard from [place] regarding the results in 2 weeks, please contact this office.

## 2017-11-02 ENCOUNTER — Ambulatory Visit: Payer: BLUE CROSS/BLUE SHIELD | Admitting: Family Medicine

## 2017-11-02 ENCOUNTER — Encounter: Payer: Self-pay | Admitting: Physician Assistant

## 2017-11-02 ENCOUNTER — Encounter: Payer: Self-pay | Admitting: Family Medicine

## 2017-11-02 ENCOUNTER — Other Ambulatory Visit: Payer: Self-pay

## 2017-11-02 VITALS — BP 106/71 | HR 84 | Temp 98.4°F | Resp 18 | Ht 64.33 in | Wt 183.6 lb

## 2017-11-02 DIAGNOSIS — L03012 Cellulitis of left finger: Secondary | ICD-10-CM | POA: Diagnosis not present

## 2017-11-02 NOTE — Progress Notes (Signed)
Patient ID: Briana Reed, female    DOB: 1985/10/26  Age: 32 y.o. MRN: [id number]  Chief Complaint  Patient presents with  . Hand Pain    f/u- lump on middle left finger    Subjective:   Patient is here for recheck of the infected finger.  She says it is less tender than it was yesterday and she thinks it is doing better.  Current allergies, medications, problem list, past/family and social histories reviewed.  Objective:  BP 106/71 (BP Location: Right Arm, Patient Position: Sitting, Cuff Size: Normal)   Pulse 84   Temp 98.4 F (36.9 C) (Oral)   Resp 18   Ht 5' 4.33" (1.634 m)   Wt 183 lb 9.6 oz (83.3 kg)   SpO2 98%   BMI 31.19 kg/m   Cultures are still pending.  The fingertip has a place right under the nail itself, as well as down below the cuticle on the soft tissue of the distal phalanx.  That is the larger area, about 1 cm in diameter, with a whitish discoloration.  The tip of a scalpel was used to barely open the quite area and there is some watery secretion underneath it but no obvious yellow pus.  She really does not seem to be exquisitely tender now.  It appears to be on the mend.  Assessment & Plan:   Assessment: 1. Paronychia of finger of left hand       Plan: See instructions.  We will just basically continue the current game plan and if it gets worse she will come back in.  No etiologic cause can be determined.  No orders of the defined types were placed in this encounter.   No orders of the defined types were placed in this encounter.        Patient Instructions   Continue to do the soaking and watching it carefully.  If it starts to fester up worse at any time get rechecked.  Continue the clindamycin.   IF you received an x-ray today, you will receive an invoice from [facility]. Please contact [facility] at [phone] with questions or concerns regarding your invoice.   IF you received labwork today, you will receive an  invoice from [facility]. Please contact [facility] at [phone] with questions or concerns regarding your invoice.   Our billing staff will not be able to assist you with questions regarding bills from these companies.  You will be contacted with the lab results as soon as they are available. The fastest way to get your results is to activate your My Chart account. Instructions are located on the last page of this paperwork. If you have not heard from us regarding the results in 2 weeks, please contact this office.        Return if symptoms worsen or fail to improve.   Janace Hoardavid Hopper, MD 11/02/2017

## 2017-11-02 NOTE — Patient Instructions (Addendum)
Continue to do the soaking and watching it carefully.  If it starts to fester up worse at any time get rechecked.  Continue the clindamycin.   IF you received an x-ray today, you will receive an invoice from [facility]. Please contact [facility] at [phone] with questions or concerns regarding your invoice.   IF you received labwork today, you will receive an invoice from [facility]. Please contact [facility] at [phone] with questions or concerns regarding your invoice.   Our billing staff will not be able to assist you with questions regarding bills from these companies.  You will be contacted with the lab results as soon as they are available. The fastest way to get your results is to activate your My Chart account. Instructions are located on the last page of this paperwork. If you have not heard from us regarding the results in 2 weeks, please contact this office.

## 2017-11-03 LAB — WOUND CULTURE: Organism ID, Bacteria: NONE SEEN

## 2017-11-03 LAB — HERPES SIMPLEX VIRUS CULTURE

## 2017-11-11 ENCOUNTER — Other Ambulatory Visit: Payer: Self-pay | Admitting: Family Medicine

## 2017-11-11 DIAGNOSIS — F330 Major depressive disorder, recurrent, mild: Secondary | ICD-10-CM

## 2017-11-11 NOTE — Telephone Encounter (Signed)
I will send a refill once she gets a follow up to be seen. Thank you

## 2017-11-11 NOTE — Telephone Encounter (Signed)
She called as she is out and wanting to see if can get it today

## 2017-11-11 NOTE — Telephone Encounter (Signed)
Refill request for general medication. [facility]  Last office visit 07/24/2017  Also seen Sharyon CableElizabeth Poulose on 09/25/2017  No follow-ups on file.

## 2017-11-11 NOTE — Telephone Encounter (Signed)
Pt called to state she is out of medication, trying to see if meds can be filled today

## 2017-11-12 ENCOUNTER — Other Ambulatory Visit: Payer: Self-pay | Admitting: Family Medicine

## 2017-11-12 DIAGNOSIS — F330 Major depressive disorder, recurrent, mild: Secondary | ICD-10-CM

## 2017-11-12 NOTE — Telephone Encounter (Signed)
Patient calling checking status, out of meds, has a headache.

## 2017-11-12 NOTE — Telephone Encounter (Signed)
FYI

## 2017-11-12 NOTE — Telephone Encounter (Signed)
Pt called again to check on medication refill, I advised the request has been sent to the provider, advised pt to allow 3 business days for RX refills, pt states she didn't realize was out and did not have refills, she was very upset stating this is ridiculous and stated I may not be aware this medication is for mood and being without it can cause side effects such as headaches, I was trying to explain to the patient that request is marked as high priority and pt ended call abruptly.

## 2017-11-12 NOTE — Telephone Encounter (Signed)
Patient is scheduled for 12/25/17. Call back [phone]

## 2017-11-12 NOTE — Telephone Encounter (Signed)
Called [phone] @ 8:19 left voice message informing pt to schedule appt and then dr Carlynn Purlsowles will send in refills

## 2017-11-13 ENCOUNTER — Ambulatory Visit: Payer: BLUE CROSS/BLUE SHIELD | Admitting: Physician Assistant

## 2017-11-13 NOTE — Telephone Encounter (Signed)
I have never seen this patient

## 2017-11-18 DIAGNOSIS — J3501 Chronic tonsillitis: Secondary | ICD-10-CM | POA: Diagnosis not present

## 2017-11-19 ENCOUNTER — Ambulatory Visit: Payer: BLUE CROSS/BLUE SHIELD | Admitting: Physician Assistant

## 2017-11-21 DIAGNOSIS — F411 Generalized anxiety disorder: Secondary | ICD-10-CM | POA: Diagnosis not present

## 2017-11-26 ENCOUNTER — Ambulatory Visit: Payer: BLUE CROSS/BLUE SHIELD | Admitting: Physician Assistant

## 2017-11-26 ENCOUNTER — Other Ambulatory Visit: Payer: Self-pay

## 2017-11-26 ENCOUNTER — Encounter: Payer: Self-pay | Admitting: Physician Assistant

## 2017-11-26 VITALS — BP 120/66 | HR 84 | Temp 99.1°F | Resp 16 | Ht 64.33 in | Wt 184.6 lb

## 2017-11-26 DIAGNOSIS — E559 Vitamin D deficiency, unspecified: Secondary | ICD-10-CM

## 2017-11-26 DIAGNOSIS — F32A Depression, unspecified: Secondary | ICD-10-CM

## 2017-11-26 DIAGNOSIS — Z7689 Persons encountering health services in other specified circumstances: Secondary | ICD-10-CM | POA: Diagnosis not present

## 2017-11-26 DIAGNOSIS — F329 Major depressive disorder, single episode, unspecified: Secondary | ICD-10-CM

## 2017-11-26 NOTE — Patient Instructions (Addendum)
Follow up 3 months for reevaluation. It was such a pleasure to see you again. Thank you for letting me participate in your health and well being.  Bone Health Bones protect organs, store calcium, and anchor muscles. Good health habits, such as eating nutritious foods and exercising regularly, are important for maintaining healthy bones. They can also help to prevent a condition that causes bones to lose density and become weak and brittle (osteoporosis). Why is bone mass important? Bone mass refers to the amount of bone tissue that you have. The higher your bone mass, the stronger your bones. An important step toward having healthy bones throughout life is to have strong and dense bones during childhood. A young adult who has a high bone mass is more likely to have a high bone mass later in life. Bone mass at its greatest it is called peak bone mass. A large decline in bone mass occurs in older adults. In women, it occurs about the time of menopause. During this time, it is important to practice good health habits, because if more bone is lost than what is replaced, the bones will become less healthy and more likely to break (fracture). If you find that you have a low bone mass, you may be able to prevent osteoporosis or further bone loss by changing your diet and lifestyle. How can I find out if my bone mass is low? Bone mass can be measured with an X-ray test that is called a bone mineral density (BMD) test. This test is recommended for all women who are age 224 or older. It may also be recommended for men who are age 60 or older, or for people who are more likely to develop osteoporosis due to:  Having bones that break easily.  Having a long-term disease that weakens bones, such as kidney disease or rheumatoid arthritis.  Having menopause earlier than normal.  Taking medicine that weakens bones, such as steroids, thyroid hormones, or hormone treatment for breast cancer or prostate  cancer.  Smoking.  Drinking three or more alcoholic drinks each day.  What are the nutritional recommendations for healthy bones? To have healthy bones, you need to get enough of the right minerals and vitamins. Most nutrition experts recommend getting these nutrients from the foods that you eat. Nutritional recommendations vary from person to person. Ask your health care provider what is healthy for you. Here are some general guidelines. Calcium Recommendations Calcium is the most important (essential) mineral for bone health. Most people can get enough calcium from their diet, but supplements may be recommended for people who are at risk for osteoporosis. Good sources of calcium include:  Dairy products, such as low-fat or nonfat milk, cheese, and yogurt.  Dark green leafy vegetables, such as bok choy and broccoli.  Calcium-fortified foods, such as orange juice, cereal, bread, soy beverages, and tofu products.  Nuts, such as almonds.  Follow these recommended amounts for daily calcium intake:  Children, age 59?3: 700 mg.  Children, age 22?8: 1,000 mg.  Children, age 36?13: 1,300 mg.  Teens, age 594?18: 1,300 mg.  Adults, age 599?50: 1,000 mg.  Adults, age 71?70: ? Men: 1,000 mg. ? Women: 1,200 mg.  Adults, age 36 or older: 1,200 mg.  Pregnant and breastfeeding females: ? Teens: 1,300 mg. ? Adults: 1,000 mg.  Vitamin D Recommendations Vitamin D is the most essential vitamin for bone health. It helps the body to absorb calcium. Sunlight stimulates the skin to make vitamin D, so be sure  to get enough sunlight. If you live in a cold climate or you do not get outside often, your health care provider may recommend that you take vitamin D supplements. Good sources of vitamin D in your diet include:  Egg yolks.  Saltwater fish.  Milk and cereal fortified with vitamin D.  Follow these recommended amounts for daily vitamin D intake:  Children and teens, age 48?18: 600  international units.  Adults, age 63 or younger: 400-800 international units.  Adults, age 69 or older: 800-1,000 international units.  Other Nutrients Other nutrients for bone health include:  Phosphorus. This mineral is found in meat, poultry, dairy foods, nuts, and legumes. The recommended daily intake for adult men and adult women is 700 mg.  Magnesium. This mineral is found in seeds, nuts, dark green vegetables, and legumes. The recommended daily intake for adult men is 400?420 mg. For adult women, it is 310?320 mg.  Vitamin K. This vitamin is found in green leafy vegetables. The recommended daily intake is 120 mg for adult men and 90 mg for adult women.  What type of physical activity is best for building and maintaining healthy bones? Weight-bearing and strength-building activities are important for building and maintaining peak bone mass. Weight-bearing activities cause muscles and bones to work against gravity. Strength-building activities increases muscle strength that supports bones. Weight-bearing and muscle-building activities include:  Walking and hiking.  Jogging and running.  Dancing.  Gym exercises.  Lifting weights.  Tennis and racquetball.  Climbing stairs.  Aerobics.  Adults should get at least 30 minutes of moderate physical activity on most days. Children should get at least 60 minutes of moderate physical activity on most days. Ask your health care provide what type of exercise is best for you. Where can I find more information? For more information, check out the following websites:  National Osteoporosis Foundation: http://burton-owens.org/  [employer]: http://www.niams.http://www.johnson-fowler.biz/.asp  This information is not intended to replace advice given to you by your health care provider. Make sure you discuss any questions you have with your health care provider. Document Released:  07/14/2003 Document Revised: 11/11/2015 Document Reviewed: 04/28/2014 Elsevier Interactive Patient Education  2018 Elsevier Inc. Herpetic Whitlow Herpetic whitlow is an infection that affects the skin. It most commonly affects the fingers. The infection can recur, but the first occurrence is usually the most severe. What are the causes? This condition is caused by herpes simplex virus (HSV) type 1 and HSV type 2. You can get herpetic whitlow if body fluids that are infected with either of these viruses get into a break in the skin. HSV commonly affects the mouth and genitals, but it can affect many other parts of the body. Most people who get herpetic whitlow have an HSV infection in another part of the body that spreads to the fingers through a cut. Health care workers can get herpetic whitlow from touching the mouths of patients who have oral herpes. What increases the risk? This condition is more likely to develop in:  People who have an HSV infection.  People who work in the health care industry, particularly in dentistry.  What are the signs or symptoms? Symptoms of this condition usually develop 2-20 days after exposure to the virus. Early symptoms include:  Pain, burning, or tingling in the infected area.  Fever.  Redness.  Swelling.  About 7-10 days after symptoms begin to appear, the lymph nodes may swell, and rice-sized fluid-filled bumps will develop on the infected area. These bumps  will develop into sores or break open. Symptoms usually improve 10-14 days later when the sores crust over and heal. How is this diagnosed? This condition may be diagnosed with a physical exam. A sample of your skin or your blood may also be taken for testing. How is this treated? This condition goes away on its own. Your health care provider may prescribe topical or oral antiviral medicines to relieve symptoms and to prevent the infection from spreading to others. Follow these instructions at  home:  Take or apply medicines only as directed by your health care provider.  Wash your hands often.  If you work in the health care industry, Product managerwear gloves.  Apply ice to the affected area: ? Put ice in a plastic bag. ? Place a towel between your skin and the bag. ? Leave the ice on for 20 minutes, 2-3 times per day.  The infection can spread to other people. It can also spread to other areas of your body, especially to your mouth and your genital area. To keep it from spreading: ? Cover the affected area with a bandage (dressing). If you work in the health care industry, Product managerwear gloves. ? Avoid close contact with other people until the bumps heal. ? Do not share towels and washcloths. ? Do not touch the bumps with your other hand or pick at your scabs. ? Do not touch your eyes, mouth, or genital area unless you wash your hands first. Contact a health care provider if:  The infection has spread to another area of your body.  Your symptoms become severe.  The infection recurs. This information is not intended to replace advice given to you by your health care provider. Make sure you discuss any questions you have with your health care provider. Document Released: 07/14/2002 Document Revised: 12/14/2015 Document Reviewed: 02/02/2014 Elsevier Interactive Patient Education  2017 [employer]    IF you received an x-ray today, you will receive an invoice from [facility]. Please contact [facility] at [phone] with questions or concerns regarding your invoice.   IF you received labwork today, you will receive an invoice from [facility]. Please contact [facility] at [phone] with questions or concerns regarding your invoice.   Our billing staff will not be able to assist you with questions regarding bills from these companies.  You will be contacted with the lab results as soon as they are available. The fastest way to get your results is to activate your My Chart  account. Instructions are located on the last page of this paperwork. If you have not heard from us regarding the results in 2 weeks, please contact this office.

## 2017-11-26 NOTE — Telephone Encounter (Signed)
Mirena rcvd/charged 08/12/17

## 2017-11-26 NOTE — Progress Notes (Signed)
Briana Reed  MRN: [id number] DOB: 1986/03/28  Subjective:  Briana Reed is a 32 y.o. female seen in office today for a chief complaint to establish care and f/u on depression.  Chronic medical conditions: 1) Depression: Has dealt this since middle school. meds tried: prozac, effexor, "lots of meds that I cannot remember." Started back on Wellbutrin about 5 months ago by other PCP but has not followed up in office to discuss how she is doing. Was dealing with family issues, sig other issues, and financial strain at the time. Today, pt notes the Wellbutrin has really helped. She is feeling much better on medication. Tolerating med well, no side effects noted.  The "dooming gloom thoughts," decreased pleasure, and dysphoric mood have all improved. She denies SI and HI. Going through a break up now, which was needed. She is seeeing therapist twice per week and that is going well. Overall, feeling much better about life at this time.   2) Vit d def: Takes 2400 IU daily. Last vit d check was 06/2017 and was 26.  3) Anemia: when she was a vegan, normalized now.   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 Followed up with gynecology. Had colposcopy which confirmed LGSIL. Needs f/u pap in 04/2018.   No other questions or concerns.   Review of Systems  Constitutional: Negative for chills, diaphoresis and fever.  Eyes: Negative for visual disturbance.  Cardiovascular: Negative for chest pain and palpitations.  Neurological: Negative for dizziness and light-headedness.      Patient Active Problem List   Diagnosis Date Noted  . LGSIL on Pap smear of cervix 10/14/2017  . Cervical high risk human papillomavirus (HPV) DNA test positive 08/05/2017   Allergies as of 11/26/2017   No Known Allergies     Medication List        Accurate as of 11/26/17  9:36 PM. Always use your most recent med list.          buPROPion 150 MG 24 hr tablet Commonly known as:  WELLBUTRIN XL TAKE ONE TABLET BY  MOUTH DAILY   cholecalciferol 400 units Tabs tablet Commonly known as:  VITAMIN D Take 5,000 Units by mouth.   levonorgestrel 20 MCG/24HR IUD Commonly known as:  MIRENA (52 MG) 1 Intra Uterine Device (1 each total) by Intrauterine route once for 1 dose.   Melatonin 5 MG Caps Take by mouth.   prenatal multivitamin Tabs tablet Take 1 tablet by mouth daily at 12 noon.       Current Outpatient Medications on File Prior to Visit  Medication Sig Dispense Refill  . BIOTIN SL Place 10,000 each under the tongue daily.    Marland Kitchen buPROPion (WELLBUTRIN XL) 150 MG 24 hr tablet TAKE ONE TABLET BY MOUTH DAILY 90 tablet 0  . cholecalciferol (VITAMIN D) 400 units TABS tablet Take 5,000 Units by mouth.    . Echinacea 400 MG CAPS Take 1 capsule by mouth as needed.    Marland Kitchen ibuprofen (ADVIL,MOTRIN) 600 MG tablet Take 1 tablet (600 mg total) by mouth every 6 (six) hours as needed. (Patient not taking: Reported on 11/26/2017) 30 tablet 0  . levonorgestrel (MIRENA, 52 MG,) 20 MCG/24HR IUD 1 Intra Uterine Device (1 each total) by Intrauterine route once for 1 dose. 1 Intra Uterine Device 0  . Melatonin 3-10 MG TABS Take 3 mg/day by mouth at bedtime.    . Prenatal Vit-Fe Fumarate-FA (PRENATAL MULTIVITAMIN) TABS tablet Take 1 tablet by mouth daily  at 12 noon.     No current facility-administered medications on file prior to visit.     No Known Allergies    Social History   Socioeconomic History  . Marital status: Single    Spouse name: Not on file  . Number of children: 0  . Years of education: 1314  . Highest education level: Associate degree: occupational, Scientist, product/process developmenttechnical, or vocational program  Occupational History  . 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 Financial resource strain: Not very hard  . Food insecurity:    Worry: Never true    Inability: Never true  . Transportation needs:    Medical: No    Non-medical: No  Tobacco Use  . Smoking status: Never Smoker  .  Smokeless tobacco: Never Used  Substance and Sexual Activity  . Alcohol use: Yes    Alcohol/week: 0.6 oz    Types: 1 Glasses of wine per week  . Drug use: No  . Sexual activity: Yes    Partners: Male    Birth control/protection: IUD  Lifestyle  . Physical activity:    Days per week: 3 days    Minutes per session: 60 min  . Stress: Very much  Relationships  . Social connections:    Talks on phone: More than three times a week    Gets together: More than three times a week    Attends religious service: Never    Active member of club or organization: Yes    Attends meetings of clubs or organizations: More than 4 times per year    Relationship status: Divorced  . Intimate partner violence:    Fear of current or ex partner: No    Emotionally abused: No    Physically abused: No    Forced sexual activity: No  Other Topics Concern  . 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 She appears well-developed and well-nourished. No distress.  HENT:  Head: Normocephalic and atraumatic.  Eyes: Conjunctivae are normal.  Neck: Normal range of motion.  Cardiovascular: Normal rate, regular rhythm and normal heart sounds.  Pulmonary/Chest: Effort normal.  Neurological: She is alert and oriented to person, place, and time.  Skin: Skin is warm and dry.  Psychiatric: She has a normal mood and affect.  Vitals reviewed.  [facility]  11/26/2017 11/26/2017 11/02/2017 11/01/2017 07/24/2017  Decreased Interest 1 1 0 0 0  Down, Depressed, Hopeless 1 1 0 0 1  PHQ - 2 Score 2 2 0 0 1  Altered sleeping 2 2 - - 2  Tired, decreased energy 1 2 - - 1  Change in appetite 1 1 - - 1  Feeling bad or failure about yourself  0 0 - - 1  Trouble concentrating 1  1 - - 2  Moving slowly or fidgety/restless 0 0 - - 0  Suicidal thoughts 0 0 - - 0  PHQ-9 Score 7 8 - - 8  Difficult doing work/chores - Not difficult at all - - Not difficult at all    Assessment and Plan :  1. Depression, unspecified depression type Prior PCP just sent in a 90 day refill so pt does not need med refill at this time. Sx appear to be well controlled on Wellbutrin. Rec continuing current dose and continuing w/ therapy. F/u here in 3 months for reevaluation.   2. Vitamin D deficiency Rec obtaining vit d lab at f/u visit as pt does not want blood work today. Given info for bone health.   3. Encounter to establish care I am happy to take over pt's care. Rec f/u in 3 months.   A total of 15 was spent in the room with the patient, greater than 50% of which was in counseling/coordination of care regarding depression.  Benjiman Core PA-C  [facility] 11/26/2017 2:22 PM

## 2017-11-27 ENCOUNTER — Ambulatory Visit: Payer: BLUE CROSS/BLUE SHIELD | Admitting: Family Medicine

## 2017-12-02 ENCOUNTER — Ambulatory Visit: Payer: BLUE CROSS/BLUE SHIELD | Admitting: Family Medicine

## 2017-12-03 DIAGNOSIS — F411 Generalized anxiety disorder: Secondary | ICD-10-CM | POA: Diagnosis not present

## 2017-12-19 DIAGNOSIS — F411 Generalized anxiety disorder: Secondary | ICD-10-CM | POA: Diagnosis not present

## 2017-12-25 ENCOUNTER — Ambulatory Visit: Payer: BLUE CROSS/BLUE SHIELD | Admitting: Family Medicine

## 2017-12-26 ENCOUNTER — Ambulatory Visit: Payer: BLUE CROSS/BLUE SHIELD | Admitting: Emergency Medicine

## 2017-12-26 ENCOUNTER — Other Ambulatory Visit: Payer: Self-pay

## 2017-12-26 ENCOUNTER — Encounter: Payer: Self-pay | Admitting: Emergency Medicine

## 2017-12-26 VITALS — BP 100/60 | HR 80 | Temp 99.0°F | Resp 16 | Ht 63.5 in | Wt 187.4 lb

## 2017-12-26 DIAGNOSIS — B0089 Other herpesviral infection: Secondary | ICD-10-CM | POA: Diagnosis not present

## 2017-12-26 MED ORDER — VALACYCLOVIR HCL 1 G PO TABS: 1000.0000 mg | ORAL_TABLET | Freq: Two times a day (BID) | ORAL | 0 refills | Status: DC

## 2017-12-26 NOTE — Patient Instructions (Addendum)
If you have lab work done today you will be contacted with your lab results within the next 2 weeks.  If you have not heard from us then please contact us. The fastest way to get your results is to register for My Chart.   IF you received an x-ray today, you will receive an invoice from [facility]. Please contact [facility] at [phone] with questions or concerns regarding your invoice.   IF you received labwork today, you will receive an invoice from [facility]. Please contact [facility] at [phone] with questions or concerns regarding your invoice.   Our billing staff will not be able to assist you with questions regarding bills from these companies.  You will be contacted with the lab results as soon as they are available. The fastest way to get your results is to activate your My Chart account. Instructions are located on the last page of this paperwork. If you have not heard from us regarding the results in 2 weeks, please contact this office.    Herpetic Whitlow Herpetic whitlow is an infection that affects the skin. It most commonly affects the fingers. The infection can recur, but the first occurrence is usually the most severe. What are the causes? This condition is caused by herpes simplex virus (HSV) type 1 and HSV type 2. You can get herpetic whitlow if body fluids that are infected with either of these viruses get into a break in the skin. HSV commonly affects the mouth and genitals, but it can affect many other parts of the body. Most people who get herpetic whitlow have an HSV infection in another part of the body that spreads to the fingers through a cut. Health care workers can get herpetic whitlow from touching the mouths of patients who have oral herpes. What increases the risk? This condition is more likely to develop in:  People who have an HSV infection.  People who work in the health care industry, particularly in dentistry.  What are the  signs or symptoms? Symptoms of this condition usually develop 2-20 days after exposure to the virus. Early symptoms include:  Pain, burning, or tingling in the infected area.  Fever.  Redness.  Swelling.  About 7-10 days after symptoms begin to appear, the lymph nodes may swell, and rice-sized fluid-filled bumps will develop on the infected area. These bumps will develop into sores or break open. Symptoms usually improve 10-14 days later when the sores crust over and heal. How is this diagnosed? This condition may be diagnosed with a physical exam. A sample of your skin or your blood may also be taken for testing. How is this treated? This condition goes away on its own. Your health care provider may prescribe topical or oral antiviral medicines to relieve symptoms and to prevent the infection from spreading to others. Follow these instructions at home:  Take or apply medicines only as directed by your health care provider.  Wash your hands often.  If you work in the health care industry, Product managerwear gloves.  Apply ice to the affected area: ? Put ice in a plastic bag. ? Place a towel between your skin and the bag. ? Leave the ice on for 20 minutes, 2-3 times per day.  The infection can spread to other people. It can also spread to other areas of your body, especially to your mouth and your genital area. To keep it from spreading: ? Cover the affected area with a bandage (dressing). If you  work in the health care industry, Product manager. ? Avoid close contact with other people until the bumps heal. ? Do not share towels and washcloths. ? Do not touch the bumps with your other hand or pick at your scabs. ? Do not touch your eyes, mouth, or genital area unless you wash your hands first. Contact a health care provider if:  The infection has spread to another area of your body.  Your symptoms become severe.  The infection recurs. This information is not intended to replace advice given to  you by your health care provider. Make sure you discuss any questions you have with your health care provider. Document Released: 07/14/2002 Document Revised: 12/14/2015 Document Reviewed: 02/02/2014 Elsevier Interactive Patient Education  2017 [employer].

## 2017-12-26 NOTE — Progress Notes (Signed)
[name] 32 y.o.   Chief Complaint  Patient presents with  . Hand Pain    LEFT 3rd finger started yesterday    HISTORY OF PRESENT ILLNESS: This is a 32 y.o. female complaining of finger infection that started yesterday.  Has a history of herpetic whitlow and feels the same.  HPI   Prior to Admission medications   Medication Sig Start Date End Date Taking? Authorizing Provider  BIOTIN SL Place under the tongue daily.   Yes [provider]  buPROPion (WELLBUTRIN XL) 150 MG 24 hr tablet TAKE ONE TABLET BY MOUTH DAILY 11/12/17  Yes Sowles, Danna HeftyKrichna, MD  Calcium-Magnesium-Vitamin D (CALCIUM MAGNESIUM PO) Take by mouth daily.   Yes [provider]  cholecalciferol (VITAMIN D) 400 units TABS tablet Take 5,000 Units by mouth.   Yes [provider]  Melatonin 5 MG CAPS Take by mouth.   Yes [provider]  Prenatal Vit-Fe Fumarate-FA (PRENATAL MULTIVITAMIN) TABS tablet Take 1 tablet by mouth daily at 12 noon.   Yes [provider]  levonorgestrel (MIRENA, 52 MG,) 20 MCG/24HR IUD 1 Intra Uterine Device (1 each total) by Intrauterine route once for 1 dose. 08/12/17 08/12/17  Copland, Ilona SorrelAlicia B, PA-C    No Known Allergies  Patient Active Problem List   Diagnosis Date Noted  . LGSIL on Pap smear of cervix 10/14/2017  . Cervical high risk human papillomavirus (HPV) DNA test positive 08/05/2017    Past Medical History:  Diagnosis Date  . Anemia   . Anxiety   . Depression   . GERD (gastroesophageal reflux disease)   . History of Papanicolaou smear of cervix 10/28/12; 08/02/15   NEG; LGSIL, HPV  . Immunization, viral disease    GARDASIL COMPLETED  . Vitamin D deficiency 07/2011    Past Surgical History:  Procedure Laterality Date  . ESOPHAGOGASTRODUODENOSCOPY  2008   WNL    Social History   Socioeconomic History  . Marital status: Single    Spouse name: Not on file  . Number of children: 0  . Years of education: 4314  . Highest  education level: Associate degree: occupational, Scientist, product/process developmenttechnical, or vocational program  Occupational History  . 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 Financial resource strain: Not very hard  . Food insecurity:    Worry: Never true    Inability: Never true  . Transportation needs:    Medical: No    Non-medical: No  Tobacco Use  . Smoking status: Never Smoker  . Smokeless tobacco: Never Used  Substance and Sexual Activity  . Alcohol use: Yes    Alcohol/week: 1.0 standard drinks    Types: 1 Glasses of wine per week  . Drug use: No  . Sexual activity: Yes    Partners: Male    Birth control/protection: IUD  Lifestyle  . Physical activity:    Days per week: 3 days    Minutes per session: 60 min  . Stress: Very much  Relationships  . Social connections:    Talks on phone: More than three times a week    Gets together: More than three times a week    Attends religious service: Never    Active member of club or organization: Yes    Attends meetings of clubs or organizations: More than 4 times per year    Relationship status: Divorced  . Intimate partner violence:    Fear of current or ex partner: No  Emotionally abused: No    Physically abused: No    Forced sexual activity: No  Other Topics Concern  . 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 Skin cancer Mother 45       MELANOMA  . Hypertension Mother   . Endometrial cancer Mother        on radiation treatments  . Uterine cancer Mother 61  . Hyperlipidemia Mother   . Lung cancer Maternal Grandmother 80  . Uterine cancer Maternal Grandmother 65  . Hypertension Father   . Stroke Father   . Hyperlipidemia Father   . Heart  attack Maternal Grandfather   . Heart attack Paternal Grandmother   . Heart attack Paternal Grandfather      Review of Systems  Constitutional: Negative.  Negative for chills and fever.  Respiratory: Negative for shortness of breath.   Cardiovascular: Negative for chest pain and palpitations.  Gastrointestinal: Negative for nausea and vomiting.  Skin: Positive for rash.  Neurological: Negative for dizziness and headaches.    Vitals:   12/26/17 1627  BP: 100/60  Pulse: 80  Resp: 16  Temp: 99 F (37.2 C)  SpO2: 97%    Physical Exam  Constitutional: She is oriented to person, place, and time. She appears well-developed and well-nourished.  HENT:  Head: Normocephalic and atraumatic.  Eyes: Pupils are equal, round, and reactive to light.  Neck: Normal range of motion.  Cardiovascular: Normal rate.  Pulmonary/Chest: Effort normal.  Musculoskeletal: Normal range of motion.  Neurological: She is alert and oriented to person, place, and time.  Skin: Skin is warm and dry. Capillary refill takes less than 2 seconds.  Left middle finger: NVI.  Positive vesicular lesion with erythema and mild tenderness.  Full range of motion  Psychiatric: She has a normal mood and affect. Her behavior is normal.  Vitals reviewed.  .      ASSESSMENT & PLAN: Briana Reed was seen today for hand pain.  Diagnoses and all orders for this visit:  Herpetic whitlow -     valACYclovir (VALTREX) 1000 MG tablet; Take 1 tablet (1,000 mg total) by mouth 2 (two) times daily.   Patient Instructions       If you have lab work done today you will be contacted with your lab results within the next 2 weeks.  If you have not heard from [place] then please contact us. The fastest way to get your results is to register for My Chart.   IF you received an x-ray today, you will receive an invoice from [facility]. Please contact [facility] at [phone] with questions or concerns regarding your  invoice.   IF you received labwork today, you will receive an invoice from [facility]. Please contact [facility] at [phone] with questions or concerns regarding your invoice.   Our billing staff will not be able to assist you with questions regarding bills from these companies.  You will be contacted with the lab results as soon as they are available. The fastest way to get your results is to activate your My Chart account. Instructions are located on the last page of this paperwork. If you have not heard from [place]  regarding the results in 2 weeks, please contact this office.    Herpetic Whitlow Herpetic whitlow is an infection that affects the skin. It most commonly affects the fingers. The infection can recur, but the first occurrence is usually the most severe. What are the causes? This condition is caused by herpes simplex virus (HSV) type 1 and HSV type 2. You can get herpetic whitlow if body fluids that are infected with either of these viruses get into a break in the skin. HSV commonly affects the mouth and genitals, but it can affect many other parts of the body. Most people who get herpetic whitlow have an HSV infection in another part of the body that spreads to the fingers through a cut. Health care workers can get herpetic whitlow from touching the mouths of patients who have oral herpes. What increases the risk? This condition is more likely to develop in:  People who have an HSV infection.  People who work in the health care industry, particularly in dentistry.  What are the signs or symptoms? Symptoms of this condition usually develop 2-20 days after exposure to the virus. Early symptoms include:  Pain, burning, or tingling in the infected area.  Fever.  Redness.  Swelling.  About 7-10 days after symptoms begin to appear, the lymph nodes may swell, and rice-sized fluid-filled bumps will develop on the infected area. These bumps will develop into sores or break open.  Symptoms usually improve 10-14 days later when the sores crust over and heal. How is this diagnosed? This condition may be diagnosed with a physical exam. A sample of your skin or your blood may also be taken for testing. How is this treated? This condition goes away on its own. Your health care provider may prescribe topical or oral antiviral medicines to relieve symptoms and to prevent the infection from spreading to others. Follow these instructions at home:  Take or apply medicines only as directed by your health care provider.  Wash your hands often.  If you work in the health care industry, Product manager.  Apply ice to the affected area: ? Put ice in a plastic bag. ? Place a towel between your skin and the bag. ? Leave the ice on for 20 minutes, 2-3 times per day.  The infection can spread to other people. It can also spread to other areas of your body, especially to your mouth and your genital area. To keep it from spreading: ? Cover the affected area with a bandage (dressing). If you work in the health care industry, Product manager. ? Avoid close contact with other people until the bumps heal. ? Do not share towels and washcloths. ? Do not touch the bumps with your other hand or pick at your scabs. ? Do not touch your eyes, mouth, or genital area unless you wash your hands first. Contact a health care provider if:  The infection has spread to another area of your body.  Your symptoms become severe.  The infection recurs. This information is not intended to replace advice given to you by your health care provider. Make sure you discuss any questions you have with your health care provider. Document Released: 07/14/2002 Document Revised: 12/14/2015 Document Reviewed: 02/02/2014 Elsevier Interactive Patient Education  2017 Elsevier Inc.      Edwina Barth, MD [facility]

## 2018-01-07 DIAGNOSIS — F411 Generalized anxiety disorder: Secondary | ICD-10-CM | POA: Diagnosis not present

## 2018-02-04 DIAGNOSIS — F411 Generalized anxiety disorder: Secondary | ICD-10-CM | POA: Diagnosis not present

## 2018-02-11 ENCOUNTER — Encounter: Payer: Self-pay | Admitting: Physician Assistant

## 2018-02-11 ENCOUNTER — Other Ambulatory Visit: Payer: Self-pay

## 2018-02-11 ENCOUNTER — Ambulatory Visit: Payer: BLUE CROSS/BLUE SHIELD | Admitting: Physician Assistant

## 2018-02-11 VITALS — BP 111/75 | HR 68 | Temp 98.2°F | Resp 18 | Ht 63.82 in | Wt 185.2 lb

## 2018-02-11 DIAGNOSIS — E559 Vitamin D deficiency, unspecified: Secondary | ICD-10-CM | POA: Diagnosis not present

## 2018-02-11 DIAGNOSIS — F330 Major depressive disorder, recurrent, mild: Secondary | ICD-10-CM

## 2018-02-11 DIAGNOSIS — R5383 Other fatigue: Secondary | ICD-10-CM | POA: Diagnosis not present

## 2018-02-11 MED ORDER — BUPROPION HCL ER (XL) 150 MG PO TB24: 150.0000 mg | ORAL_TABLET | Freq: Every day | ORAL | 1 refills | Status: DC

## 2018-02-11 NOTE — Progress Notes (Signed)
Briana Reed  MRN: [id number] DOB: Jun 13, 1985  Subjective:  Briana Reed is a 32 y.o. female seen in office today for a chief complaint of f/u on depression and to discuss fatigue.   -Depression: Managed with Wellbutrin XL 150 mg daily.  Feels as if this medication is working well for her.  She does not feel any dysphoric mood or glooming thoughts at this time.  No side effects noted.  Denies SI or HI.  Continues to see a therapist twice per week.   -Fatigue x 1 month. School started about 1.5 mths ago. 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 Does not feel rested when she wakes up.Eats a balanced diet. Drinks at least 60 oz of water. Has not exercised since starting school. Denies falling asleep while watching TV or driving.  Denies cough, fever, chills, sore throat, swollen lymph nodes, ab pain, nausea, vomiting, joint pain, myalgias, snoring, apneic episodes, and morning headaches. Taking vit d, multivitamin, and biotin daily.  Denies frequent caffeine use.  Denies illicit drug use.Marland KitchenHas PMH of seasonal allergies (just started taking claritin, which is helping) and vitamin d deficiency.  No family history of thyroid disease, autoimmune disease, or fibromyalgia.  Review of Systems  Constitutional: Negative for chills, diaphoresis and unexpected weight change.  Eyes: Negative for visual disturbance.  Respiratory: Negative for cough and shortness of breath.   Cardiovascular: Negative for chest pain, palpitations and leg swelling.  Gastrointestinal: Negative for abdominal pain, blood in stool, nausea and vomiting.  Endocrine: Negative for cold intolerance and heat intolerance.  Genitourinary: Negative for pelvic pain.  Musculoskeletal: Negative for arthralgias and neck stiffness.  Skin: Negative for rash.  Neurological: Negative for dizziness, weakness, light-headedness and headaches.  Psychiatric/Behavioral: Negative for decreased concentration, dysphoric mood  and sleep disturbance. The patient is not nervous/anxious.     Patient Active Problem List   Diagnosis Date Noted  . Herpetic whitlow 12/26/2017  . LGSIL on Pap smear of cervix 10/14/2017  . Cervical high risk human papillomavirus (HPV) DNA test positive 08/05/2017    Current Outpatient Medications on File Prior to Visit  Medication Sig Dispense Refill  . BIOTIN SL Place under the tongue daily.    Marland Kitchen buPROPion (WELLBUTRIN XL) 150 MG 24 hr tablet TAKE ONE TABLET BY MOUTH DAILY 90 tablet 0  . Calcium-Magnesium-Vitamin D (CALCIUM MAGNESIUM PO) Take by mouth daily.    . Loratadine 10 MG CAPS 10 mg daily.    . Multiple Vitamins-Minerals (WOMENS MULTIVITAMIN) TABS     . valACYclovir (VALTREX) 1000 MG tablet Take 1 tablet (1,000 mg total) by mouth 2 (two) times daily. 20 tablet 0  . cholecalciferol (VITAMIN D) 400 units TABS tablet Take 5,000 Units by mouth.    . levonorgestrel (MIRENA, 52 MG,) 20 MCG/24HR IUD 1 Intra Uterine Device (1 each total) by Intrauterine route once for 1 dose. 1 Intra Uterine Device 0  . Melatonin 5 MG CAPS Take by mouth.    . Prenatal Vit-Fe Fumarate-FA (PRENATAL MULTIVITAMIN) TABS tablet Take 1 tablet by mouth daily at 12 noon.     No current facility-administered medications on file prior to visit.     No Known Allergies    Social History   Socioeconomic History  . Marital status: Single    Spouse name: Not on file  . Number of children: 0  . Years of education: 75  . Highest education level: Associate degree: occupational, Hotel manager, or vocational program  Occupational History  .  Occupation: bartender     Comment: [employer]  . Financial resource strain: Not very hard  . Food insecurity:    Worry: Never true    Inability: Never true  . Transportation needs:    Medical: No    Non-medical: No  Tobacco Use  . Smoking status: Never Smoker  . Smokeless tobacco: Never Used  Substance and Sexual Activity  . Alcohol use: Yes     Alcohol/week: 1.0 standard drinks    Types: 1 Glasses of wine per week  . Drug use: No  . Sexual activity: Yes    Partners: Male    Birth control/protection: IUD  Lifestyle  . Physical activity:    Days per week: 3 days    Minutes per session: 60 min  . Stress: Very much  Relationships  . Social connections:    Talks on phone: More than three times a week    Gets together: More than three times a week    Attends religious service: Never    Active member of club or organization: Yes    Attends meetings of clubs or organizations: More than 4 times per year    Relationship status: Divorced  . Intimate partner violence:    Fear of current or ex partner: No    Emotionally abused: No    Physically abused: No    Forced sexual activity: No  Other Topics Concern  . 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 She appears well-developed and well-nourished. No distress.  HENT:  Head: Normocephalic and atraumatic.  Right Ear: Tympanic membrane, external ear and ear canal normal.  Left Ear: Tympanic membrane, external ear and ear canal normal.  Mouth/Throat: Uvula is midline, oropharynx is clear and moist and mucous membranes are normal.  Eyes: Pupils are equal, round, and reactive to light. Conjunctivae and EOM are normal. No scleral icterus.  Neck: Normal range of motion. Neck supple. No thyroid mass and no thyromegaly present.  Cardiovascular: Normal rate, regular rhythm, normal heart sounds and intact  distal pulses.  Pulmonary/Chest: Effort normal and breath sounds normal. She has no decreased breath sounds. She has no wheezes. She has no rhonchi. She has no rales.  Musculoskeletal:       Right lower leg: She exhibits no swelling.       Left lower leg: She exhibits no swelling.  Lymphadenopathy:       Head (right side): No submental, no submandibular, no tonsillar, no preauricular, no posterior auricular and no occipital adenopathy present.       Head (left side): No submental, no submandibular, no tonsillar, no preauricular, no posterior auricular and no occipital adenopathy present.    She has no cervical adenopathy.       Right: No supraclavicular adenopathy present.       Left: No supraclavicular adenopathy  present.  Neurological: She is alert and oriented to person, place, and time.  Skin: Skin is warm and dry.  Psychiatric: She has a normal mood and affect.  Vitals reviewed.    Depression screen [facility] 2/9 02/11/2018 12/26/2017 11/26/2017 11/26/2017 11/02/2017  Decreased Interest 2 0 1 1 0  Down, Depressed, Hopeless 0 0 1 1 0  PHQ - 2 Score 2 0 2 2 0  Altered sleeping 2 - 2 2 -  Tired, decreased energy 3 - 1 2 -  Change in appetite 3 - 1 1 -  Feeling bad or failure about yourself  0 - 0 0 -  Trouble concentrating 2 - 1 1 -  Moving slowly or fidgety/restless 0 - 0 0 -  Suicidal thoughts 0 - 0 0 -  PHQ-9 Score 12 - 7 8 -  Difficult doing work/chores Very difficult - - Not difficult at all -   GAD 7 : Generalized Anxiety Score 02/11/2018  Nervous, Anxious, on Edge 1  Control/stop worrying 0  Worry too much - different things 1  Trouble relaxing 2  Restless 0  Easily annoyed or irritable 0  Afraid - awful might happen 0  Total GAD 7 Score 4  Anxiety Difficulty Not difficult at all     Assessment and Plan :  1. Mild recurrent major depression ([place]) Patient feels as if her depression is very well controlled at this time.  Does not believe that fatigue is related to the  depression.  Continue with current medication regimen and therapy at this time.  Follow-up in 6 months. - buPROPion (WELLBUTRIN XL) 150 MG 24 hr tablet; Take 1 tablet (150 mg total) by mouth daily.  Dispense: 90 tablet; Refill: 1  2. Vitamin D deficiency - VITAMIN D 25 Hydroxy (Vit-D Deficiency, Fractures)  3. Fatigue, unspecified type Pt is overall well-appearing, no acute distress.  Normal physical exam findings. Discussed with patient that there are multiple etiologies for fatigue.  Do believe the patient's lifestyle is contributing to her fatigue as she is participating in school and working almost a full-time schedule.  No red flags noted.  Do not suspect infectious or inflammatory etiology patient is otherwise asymptomatic.  Only risk factor for OSA is obesity.  Could consider further work-up for this in the future if labs are normal and no improvement with lifestyle changes.  Rec cutting back on work hours, good sleep routine, engaging in light exercise throughout the day, and relaxation techniques.  - CBC with Differential/Platelet - CMP14+EGFR - TSH   Tenna Delaine PA-C  [facility] [place] 02/11/2018 2:23 PM

## 2018-02-11 NOTE — Patient Instructions (Addendum)
If you have lab work done today you will be contacted with your lab results within the next 2 weeks.  If you have not heard from [place] then please contact us. The fastest way to get your results is to register for My Chart.  Chronic Fatigue Syndrome Chronic fatigue syndrome (CFS) is a condition that causes extreme tiredness (fatigue). This fatigue does not improve with rest, and it gets worse with physical or mental activity. You may have several other symptoms along with fatigue. Symptoms may come and go, but they generally last for months. Sometimes, CFS gets better over time, but it can be a lifelong condition. There is no cure, but there are many possible treatments. You will need to work with your health care providers to find a treatment plan that works best for you. What are the causes? The cause of CFS is not known. There may be more than one cause. Possible causes include:  An infection.  An abnormal body defense system (immune system).  Low blood pressure.  Poor diet.  Physical or emotional stress.  What increases the risk? You are more likely to develop this condition if:  You are female.  You are 13?31 years old.  You have a family history of CFS.  You live with a lot of emotional stress.  What are the signs or symptoms? The main symptom of CFS is fatigue that is severe enough to interfere with day-to-day activities. This fatigue does not get better with rest, and it gets worse with physical or mental activity. There are eight other major symptoms of CFS:  Lack of energy (malaise) that lasts more than 24 hours after physical exertion.  Sleep that does not relieve fatigue (unrefreshing sleep).  Short-term memory loss or confusion.  Joint pain without redness or swelling.  Muscle aches.  Headaches.  Painful and swollen glands (lymph nodes) in the neck or under the arms.  Sore throat.  You may also have:  Abdominal cramps, constipation, or diarrhea  (irritable bowel).  Chills.  Night sweats.  Vision changes.  Dizziness.  Mental confusion (brain fog).  Clumsiness.  Sensitivity to food, noise, or odors.  Mood swings, depression, or anxiety attacks.  How is this diagnosed? There are no tests that can diagnose this condition. Your health care provider will make the diagnosis based on your medical history, a physical exam, and a mental health exam. However, it is important to make sure that your symptoms are not caused by another medical condition. You may have lab tests or X-rays to rule out other conditions. For your health care provider to diagnose CFS:  You must have had fatigue for at least 6 straight months.  Fatigue must be your first symptom, and it must be severe enough to interfere with day-to-day activities.  There must be no other cause found for the fatigue.  You must also have at least four of the eight other major symptoms of CFS.  How is this treated? There is no cure for CFS. The condition affects everyone differently. You will need to work with your team of health care providers to find the best treatments for your symptoms. Your team may include your primary care provider, physical and exercise therapists, and mental health therapists. Treatment may include:  Improving sleep with a regular bedtime routine.  Avoiding caffeine, alcohol, and tobacco.  Doing light exercise and stretching during the day.  Taking medicines to help you sleep or to relieve joint or muscle pain.  Learning and practicing relaxation techniques.  Using memory aids or doing brainteasers to improve memory and concentration.  Seeing a mental health therapist to evaluate and treat depression, if necessary.  Trying massage therapy, acupuncture, and movement exercises, such as yoga or tai chi.  Follow these instructions at home:  Activity  Exercise regularly, as told by your health care provider.  Avoid fatigue by pacing  yourself during the day and getting enough sleep at night.  Go to bed and get up at the same time every day. Eating and drinking  Avoid caffeine and alcohol.  Avoid heavy meals in the evening.  Eat a well-balanced diet. General instructions  Take over-the-counter and prescription medicines only as told by your health care provider.  Do not use herbal or dietary supplements unless they are approved by your health care provider.  Maintain a healthy weight.  Avoid stress and use stress-reducing techniques that you learn in therapy.  Do not use any products that contain nicotine or tobacco, such as cigarettes and e-cigarettes. If you need help quitting, ask your health care provider.  Consider joining a CFS support group.  Keep all follow-up visits as told by your health care provider. This is important. Contact a health care provider if:  Your symptoms do not get better or they get worse.  You feel angry, guilty, anxious, or depressed. This information is not intended to replace advice given to you by your health care provider. Make sure you discuss any questions you have with your health care provider. Document Released: 05/31/2004 Document Revised: 12/29/2015 Document Reviewed: 08/01/2015 Elsevier Interactive Patient Education  2018 [employer].   IF you received an x-ray today, you will receive an invoice from [facility]. Please contact [facility] at [phone] with questions or concerns regarding your invoice.   IF you received labwork today, you will receive an invoice from [facility]. Please contact [facility] at [phone] with questions or concerns regarding your invoice.   Our billing staff will not be able to assist you with questions regarding bills from these companies.  You will be contacted with the lab results as soon as they are available. The fastest way to get your results is to activate your My Chart account. Instructions are located on  the last page of this paperwork. If you have not heard from [place] regarding the results in 2 weeks, please contact this office.

## 2018-02-12 LAB — CBC WITH DIFFERENTIAL/PLATELET
BASOS: 0 %
Basophils Absolute: 0 10*3/uL (ref 0.0–0.2)
EOS (ABSOLUTE): 0 10*3/uL (ref 0.0–0.4)
EOS: 1 %
HEMOGLOBIN: 13.3 g/dL (ref 11.1–15.9)
Hematocrit: 39.1 % (ref 34.0–46.6)
IMMATURE GRANULOCYTES: 0 %
Immature Grans (Abs): 0 10*3/uL (ref 0.0–0.1)
LYMPHS: 27 %
Lymphocytes Absolute: 1.8 10*3/uL (ref 0.7–3.1)
MCH: 32.6 pg (ref 26.6–33.0)
MCHC: 34 g/dL (ref 31.5–35.7)
MCV: 96 fL (ref 79–97)
MONOCYTES: 6 %
Monocytes Absolute: 0.4 10*3/uL (ref 0.1–0.9)
NEUTROS PCT: 66 %
Neutrophils Absolute: 4.5 10*3/uL (ref 1.4–7.0)
Platelets: 336 10*3/uL (ref 150–450)
RBC: 4.08 x10E6/uL (ref 3.77–5.28)
RDW: 12.4 % (ref 12.3–15.4)
WBC: 6.8 10*3/uL (ref 3.4–10.8)

## 2018-02-12 LAB — CMP14+EGFR
ALBUMIN: 4.5 g/dL (ref 3.5–5.5)
ALK PHOS: 43 IU/L (ref 39–117)
ALT: 13 IU/L (ref 0–32)
AST: 12 IU/L (ref 0–40)
Albumin/Globulin Ratio: 1.7 (ref 1.2–2.2)
BILIRUBIN TOTAL: 0.2 mg/dL (ref 0.0–1.2)
BUN / CREAT RATIO: 13 (ref 9–23)
BUN: 8 mg/dL (ref 6–20)
CHLORIDE: 104 mmol/L (ref 96–106)
CO2: 21 mmol/L (ref 20–29)
Calcium: 9.7 mg/dL (ref 8.7–10.2)
Creatinine, Ser: 0.61 mg/dL (ref 0.57–1.00)
GFR calc Af Amer: 140 mL/min/{1.73_m2} (ref >59)
GFR calc non Af Amer: 121 mL/min/{1.73_m2} (ref >59)
GLOBULIN, TOTAL: 2.7 g/dL (ref 1.5–4.5)
Glucose: 98 mg/dL (ref 65–99)
POTASSIUM: 4.3 mmol/L (ref 3.5–5.2)
SODIUM: 141 mmol/L (ref 134–144)
Total Protein: 7.2 g/dL (ref 6.0–8.5)

## 2018-02-12 LAB — VITAMIN D 25 HYDROXY (VIT D DEFICIENCY, FRACTURES): VIT D 25 HYDROXY: 29.5 ng/mL — AB (ref 30.0–100.0)

## 2018-02-12 LAB — TSH: TSH: 0.818 u[IU]/mL (ref 0.450–4.500)

## 2018-02-17 ENCOUNTER — Ambulatory Visit: Payer: BLUE CROSS/BLUE SHIELD | Admitting: Physician Assistant

## 2018-02-25 ENCOUNTER — Encounter: Payer: Self-pay | Admitting: Physician Assistant

## 2018-02-25 ENCOUNTER — Other Ambulatory Visit: Payer: Self-pay

## 2018-02-25 ENCOUNTER — Ambulatory Visit: Payer: BLUE CROSS/BLUE SHIELD | Admitting: Physician Assistant

## 2018-02-25 VITALS — BP 100/70 | HR 85 | Temp 98.0°F | Resp 16 | Ht 63.75 in | Wt 183.0 lb

## 2018-02-25 DIAGNOSIS — N760 Acute vaginitis: Secondary | ICD-10-CM

## 2018-02-25 DIAGNOSIS — Z113 Encounter for screening for infections with a predominantly sexual mode of transmission: Secondary | ICD-10-CM | POA: Diagnosis not present

## 2018-02-25 DIAGNOSIS — N898 Other specified noninflammatory disorders of vagina: Secondary | ICD-10-CM

## 2018-02-25 DIAGNOSIS — B3731 Acute candidiasis of vulva and vagina: Secondary | ICD-10-CM

## 2018-02-25 DIAGNOSIS — B9689 Other specified bacterial agents as the cause of diseases classified elsewhere: Secondary | ICD-10-CM

## 2018-02-25 DIAGNOSIS — B373 Candidiasis of vulva and vagina: Secondary | ICD-10-CM

## 2018-02-25 LAB — POCT WET + KOH PREP
Trich by wet prep: ABSENT
Yeast by KOH: ABSENT
Yeast by wet prep: ABSENT

## 2018-02-25 MED ORDER — FLUCONAZOLE 150 MG PO TABS: 150.0000 mg | ORAL_TABLET | Freq: Once | ORAL | 0 refills | Status: AC

## 2018-02-25 MED ORDER — METRONIDAZOLE 500 MG PO TABS: 500.0000 mg | ORAL_TABLET | Freq: Two times a day (BID) | ORAL | 0 refills | Status: DC

## 2018-02-25 NOTE — Patient Instructions (Addendum)
I am treating you today for BV (see below).  Start taking Flagyl. DO NOT DRINK WHILE TAKING THIS MEDICATION.   I will contact you with the rest of your labs.   Bacterial Vaginosis Bacterial vaginosis is a vaginal infection that occurs when the normal balance of bacteria in the vagina is disrupted. It results from an overgrowth of certain bacteria. This is the most common vaginal infection among women ages 3-44. Because bacterial vaginosis increases your risk for STIs (sexually transmitted infections), getting treated can help reduce your risk for chlamydia, gonorrhea, herpes, and HIV (human immunodeficiency virus). Treatment is also important for preventing complications in pregnant women, because this condition can cause an early (premature) delivery. What are the causes? This condition is caused by an increase in harmful bacteria that are normally present in small amounts in the vagina. However, the reason that the condition develops is not fully understood. What increases the risk? The following factors may make you more likely to develop this condition:  Having a new sexual partner or multiple sexual partners.  Having unprotected sex.  Douching.  Having an intrauterine device (IUD).  Smoking.  Drug and alcohol abuse.  Taking certain antibiotic medicines.  Being pregnant.  You cannot get bacterial vaginosis from toilet seats, bedding, swimming pools, or contact with objects around you. What are the signs or symptoms? Symptoms of this condition include:  Grey or white vaginal discharge. The discharge can also be watery or foamy.  A fish-like odor with discharge, especially after sexual intercourse or during menstruation.  Itching in and around the vagina.  Burning or pain with urination.  Some women with bacterial vaginosis have no signs or symptoms. How is this diagnosed? This condition is diagnosed based on:  Your medical history.  A physical exam of the  vagina.  Testing a sample of vaginal fluid under a microscope to look for a large amount of bad bacteria or abnormal cells. Your health care provider may use a cotton swab or a small wooden spatula to collect the sample.  How is this treated? This condition is treated with antibiotics. These may be given as a pill, a vaginal cream, or a medicine that is put into the vagina (suppository). If the condition comes back after treatment, a second round of antibiotics may be needed. Follow these instructions at home: Medicines  Take over-the-counter and prescription medicines only as told by your health care provider.  Take or use your antibiotic as told by your health care provider. Do not stop taking or using the antibiotic even if you start to feel better. General instructions  If you have a female sexual partner, tell her that you have a vaginal infection. She should see her health care provider and be treated if she has symptoms. If you have a female sexual partner, he does not need treatment.  During treatment: ? Avoid sexual activity until you finish treatment. ? Do not douche. ? Avoid alcohol as directed by your health care provider. ? Avoid breastfeeding as directed by your health care provider.  Drink enough water and fluids to keep your urine clear or pale yellow.  Keep the area around your vagina and rectum clean. ? Wash the area daily with warm water. ? Wipe yourself from front to back after using the toilet.  Keep all follow-up visits as told by your health care provider. This is important. How is this prevented?  Do not douche.  Wash the outside of your vagina with warm water only.  Use protection when having sex. This includes latex condoms and dental dams.  Limit how many sexual partners you have. To help prevent bacterial vaginosis, it is best to have sex with just one partner (monogamous).  Make sure you and your sexual partner are tested for STIs.  Wear cotton or  cotton-lined underwear.  Avoid wearing tight pants and pantyhose, especially during summer.  Limit the amount of alcohol that you drink.  Do not use any products that contain nicotine or tobacco, such as cigarettes and e-cigarettes. If you need help quitting, ask your health care provider.  Do not use illegal drugs. Where to find more information:  Centers for Disease Control and Prevention: SolutionApps.co.za  American Sexual Health Association (ASHA): www.ashastd.org  U.S. Department of Health and Health and safety inspector, Office on Women's Health: ConventionalMedicines.si or http://www.anderson-williamson.info/ Contact a health care provider if:  Your symptoms do not improve, even after treatment.  You have more discharge or pain when urinating.  You have a fever.  You have pain in your abdomen.  You have pain during sex.  You have vaginal bleeding between periods. Summary  Bacterial vaginosis is a vaginal infection that occurs when the normal balance of bacteria in the vagina is disrupted.  Because bacterial vaginosis increases your risk for STIs (sexually transmitted infections), getting treated can help reduce your risk for chlamydia, gonorrhea, herpes, and HIV (human immunodeficiency virus). Treatment is also important for preventing complications in pregnant women, because the condition can cause an early (premature) delivery.  This condition is treated with antibiotic medicines. These may be given as a pill, a vaginal cream, or a medicine that is put into the vagina (suppository). This information is not intended to replace advice given to you by your health care provider. Make sure you discuss any questions you have with your health care provider. Document Released: 04/23/2005 Document Revised: 08/27/2016 Document Reviewed: 01/07/2016 Elsevier Interactive Patient Education  [employer].    If you have lab work done today you will be contacted with  your lab results within the next 2 weeks.  If you have not heard from [place] then please contact us. The fastest way to get your results is to register for My Chart.   IF you received an x-ray today, you will receive an invoice from [facility]. Please contact [facility] at [phone] with questions or concerns regarding your invoice.   IF you received labwork today, you will receive an invoice from [facility]. Please contact [facility] at [phone] with questions or concerns regarding your invoice.   Our billing staff will not be able to assist you with questions regarding bills from these companies.  You will be contacted with the lab results as soon as they are available. The fastest way to get your results is to activate your My Chart account. Instructions are located on the last page of this paperwork. If you have not heard from [place] regarding the results in 2 weeks, please contact this office.

## 2018-02-25 NOTE — Progress Notes (Signed)
BRETTE CAST  MRN: [id number] DOB: 07-25-1985  PCP: Magdalene River, PA-C  Subjective:  Pt is a 32 year old female who presents to clinic for vaginal itching and itching x 2 months. occasional discharge that is thick and white/clear.   She tried Monistat last week, no help.   She would like screening for STDs  Review of Systems  Gastrointestinal: Negative for abdominal pain and nausea.  Genitourinary: Positive for vaginal discharge and vaginal pain ("burning/itching"). Negative for menstrual problem.    Patient Active Problem List   Diagnosis Date Noted  . Herpetic whitlow 12/26/2017  . LGSIL on Pap smear of cervix 10/14/2017  . Cervical high risk human papillomavirus (HPV) DNA test positive 08/05/2017    Current Outpatient Medications on File Prior to Visit  Medication Sig Dispense Refill  . buPROPion (WELLBUTRIN XL) 150 MG 24 hr tablet Take 1 tablet (150 mg total) by mouth daily. 90 tablet 1  . Cholecalciferol (VITAMIN D3) 1000 units CAPS Take by mouth.    . Loratadine 10 MG CAPS 10 mg daily.    . Melatonin 5 MG CAPS Take by mouth.    . Multiple Vitamins-Minerals (WOMENS MULTIVITAMIN) TABS     . Prenatal Vit-Fe Fumarate-FA (PRENATAL MULTIVITAMIN) TABS tablet Take 1 tablet by mouth daily at 12 noon.    Marland Kitchen BIOTIN SL Place under the tongue daily.    Marland Kitchen levonorgestrel (MIRENA, 52 MG,) 20 MCG/24HR IUD 1 Intra Uterine Device (1 each total) by Intrauterine route once for 1 dose. 1 Intra Uterine Device 0   No current facility-administered medications on file prior to visit.     No Known Allergies   Objective:  BP 100/70 (BP Location: Left Arm, Patient Position: Sitting, Cuff Size: Large)   Pulse 85   Temp 98 F (36.7 C) (Oral)   Resp 16   Ht 5' 3.75" (1.619 m)   Wt 183 lb (83 kg) Comment: per pt. Refused to weigh  LMP 01/31/2018   SpO2 98%   BMI 31.66 kg/m   Physical Exam  Constitutional: She appears well-developed and well-nourished. No distress.    Genitourinary: Vagina normal. Cervix exhibits discharge (thin, white). Cervix exhibits no motion tenderness. Right adnexum displays no mass and no tenderness. Left adnexum displays no mass and no tenderness.  Psychiatric: She has a normal mood and affect. Thought content normal.  Vitals reviewed.  Results for orders placed or performed in visit on 02/25/18  POCT Wet + KOH Prep  Result Value Ref Range   Yeast by KOH Absent Absent   Yeast by wet prep Absent Absent   WBC by wet prep Few (A) Few   Clue Cells Wet Prep HPF POC Many (A) None   Trich by wet prep Absent Absent   Bacteria Wet Prep HPF POC Many (A) Few   Epithelial Cells By [employer] Pref (UMFC) Few None, Few, Too numerous to count   RBC,UR,HPF,POC None None RBC/hpf    Assessment and Plan :  1. Bacterial vaginitis - Pt c/o vaginal itching and discharge. No red flags. + clue cells on wet prep. Plan to treat with flagyl, avoid drinking discussed. RTC if no improvement. Routine STD screening done today.  - metroNIDAZOLE (FLAGYL) 500 MG tablet; Take 1 tablet (500 mg total) by mouth 2 (two) times daily with a meal. DO NOT CONSUME ALCOHOL WHILE TAKING THIS MEDICATION.  Dispense: 14 tablet; Refill: 0  2. Vaginal itching - POCT Wet + KOH Prep  3. Yeast vaginitis -  she is prone to yeast vaginitis after antibiotic use.  - fluconazole (DIFLUCAN) 150 MG tablet; Take 1 tablet (150 mg total) by mouth once for 1 dose. Repeat if needed  Dispense: 2 tablet; Refill: 0  4. Screen for STD (sexually transmitted disease) - Chlamydia/Gonococcus/Trichomonas, NAA   Marco Collie, PA-C  Primary Care at [facility] 02/25/2018 4:02 PM  Please note: Portions of this report may have been transcribed using dragon voice recognition software. Every effort was made to ensure accuracy; however, inadvertent computerized transcription errors may be present.

## 2018-02-27 LAB — CHLAMYDIA/GONOCOCCUS/TRICHOMONAS, NAA
Chlamydia by NAA: NEGATIVE
Gonococcus by NAA: NEGATIVE
Trich vag by NAA: NEGATIVE

## 2018-03-04 DIAGNOSIS — F411 Generalized anxiety disorder: Secondary | ICD-10-CM | POA: Diagnosis not present

## 2018-03-05 ENCOUNTER — Encounter: Payer: Self-pay | Admitting: Physician Assistant

## 2018-03-05 ENCOUNTER — Other Ambulatory Visit: Payer: Self-pay

## 2018-03-05 ENCOUNTER — Ambulatory Visit: Payer: BLUE CROSS/BLUE SHIELD | Admitting: Physician Assistant

## 2018-03-05 VITALS — BP 104/69 | HR 97 | Temp 99.7°F | Resp 20

## 2018-03-05 DIAGNOSIS — J029 Acute pharyngitis, unspecified: Secondary | ICD-10-CM | POA: Diagnosis not present

## 2018-03-05 LAB — POC INFLUENZA A&B (BINAX/QUICKVUE)
INFLUENZA A, POC: NEGATIVE
Influenza B, POC: NEGATIVE

## 2018-03-05 LAB — POCT RAPID STREP A (OFFICE): Rapid Strep A Screen: NEGATIVE

## 2018-03-05 MED ORDER — IBUPROFEN 800 MG PO TABS: 800.0000 mg | ORAL_TABLET | Freq: Three times a day (TID) | ORAL | 0 refills | Status: DC | PRN

## 2018-03-05 NOTE — Progress Notes (Signed)
MRN: [id number] DOB: [birth year]  Subjective:   Briana Reed is a 32 y.o. female presenting for chief complaint of Sore Throat (X 1.5  days- sore throat/fever) and Generalized Body Aches .  Reports 1.5 days of history of sore throat, chills, body aches,and fever (T max yesterday 100.4).  Some runny nose. Has not tried anything for relief. Denies cough, itchy watery eyes, ear pain, difficulty swallowing, wheezing, shortness of breath, chest tightness and chest pain, chills, nausea, vomiting, abdominal pain and diarrhea. Has had sick contact with people at work. Has history of seasonal allergies, no history of asthma.  Denies recent oral sex.  Patient has not had flu shot this season. Denies smoking. Currently on Flagyl for BV. Denies any other aggravating or relieving factors, no other questions or concerns.  Jacoya has a current medication list which includes the following prescription(s): biotin, bupropion, vitamin d3, loratadine, metronidazole, womens multivitamin, ibuprofen, and levonorgestrel. Also has No Known Allergies.  Kallyn  has a past medical history of Anemia, Anxiety, Depression, GERD (gastroesophageal reflux disease), History of Papanicolaou smear of cervix (10/28/12; 08/02/15), Immunization, viral disease, and Vitamin D deficiency (07/2011). Also  has a past surgical history that includes Esophagogastroduodenoscopy (2008).   Objective:   Vitals: BP 104/69   Pulse 97   Temp 99.7 F (37.6 C) (Oral)   Resp 20   SpO2 99%   Physical Exam  Constitutional: She is oriented to person, place, and time. She appears well-developed and well-nourished. No distress.  HENT:  Head: Normocephalic and atraumatic.  Right Ear: Tympanic membrane, external ear and ear canal normal.  Left Ear: Tympanic membrane, external ear and ear canal normal.  Nose: Mucosal edema present. Right sinus exhibits no maxillary sinus tenderness and no frontal sinus tenderness. Left sinus exhibits no maxillary  sinus tenderness and no frontal sinus tenderness.  Mouth/Throat: Uvula is midline and mucous membranes are normal. No uvula swelling. Posterior oropharyngeal erythema present. No posterior oropharyngeal edema or tonsillar abscesses. Tonsils are 2+ on the right. Tonsils are 2+ on the left. Tonsillar exudate (few noted on left tonsil).  Tonsils are erythematous bilaterally.   Eyes: Conjunctivae are normal.  Neck: Normal range of motion.  Cardiovascular: Intact distal pulses.  Pulmonary/Chest: Effort normal.  Lymphadenopathy:       Head (right side): No submental, no submandibular, no tonsillar, no preauricular, no posterior auricular and no occipital adenopathy present.       Head (left side): No submental, no submandibular, no tonsillar, no preauricular, no posterior auricular and no occipital adenopathy present.    She has cervical adenopathy.       Right cervical: Superficial cervical adenopathy present.       Left cervical: Superficial cervical adenopathy present.       Right: No supraclavicular adenopathy present.       Left: No supraclavicular adenopathy present.  Neurological: She is alert and oriented to person, place, and time.  Skin: Skin is warm and dry.  Psychiatric: She has a normal mood and affect.  Vitals reviewed.   Results for orders placed or performed in visit on 03/05/18 (from the past 24 hour(s))  POCT rapid strep A     Status: None   Collection Time: 03/05/18  9:57 AM  Result Value Ref Range   Rapid Strep A Screen Negative Negative  POC Influenza A&B(BINAX/QUICKVUE)     Status: None   Collection Time: 03/05/18 10:33 AM  Result Value Ref Range   Influenza A, POC Negative  Negative   Influenza B, POC Negative Negative   Centor score of 3  Assessment and Plan :  1. Acute pharyngitis, unspecified etiology Rapid strep and flu test neg. Cx and labs pending. Likely viral etiology. Suggest sx treatment at this time. See AVS. Advised pt to return to clinic if symptoms  worsen, do not improve, or as needed.  - ibuprofen (ADVIL,MOTRIN) 800 MG tablet; Take 1 tablet (800 mg total) by mouth every 8 (eight) hours as needed.  Dispense: 30 tablet; Refill: 0  2. Sore throat - POCT rapid strep A - Epstein-Barr virus VCA antibody panel - POC Influenza A&B(BINAX/QUICKVUE) - Culture, Group A Strep    Benjiman Core, PA-C  Primary Care at [facility] 03/05/2018 10:51 AM

## 2018-03-05 NOTE — Patient Instructions (Addendum)
-   You may take ibuprofen 800mg  with food for your throat every 8 hours. Tylenol can also be used for sore throat. - You may also use 2 tablespoons of warm honey every 4-6 hours to coat and soothe your throat. - Drink plenty of water, at least 64 ounces daily and rest to make sure your body has a chance to get better.  -Tea recipe for sore throat: boil water, add 2 inches shaved ginger root, steep 15 minutes, add juice from 2 full lemons, and 2 tbsp honey. - Please let me know if you are not seeing any improvement or get worse in 5-7 days.  If you have lab work done today you will be contacted with your lab results within the next 2 weeks.  If you have not heard from [place] then please contact us. The fastest way to get your results is to register for My Chart.     Pharyngitis Pharyngitis is a sore throat (pharynx). There is redness, pain, and swelling of your throat. Follow these instructions at home:  Drink enough fluids to keep your pee (urine) clear or pale yellow.  Only take medicine as told by your doctor. ? You may get sick again if you do not take medicine as told. Finish your medicines, even if you start to feel better. ? Do not take aspirin.  Rest.  Rinse your mouth (gargle) with salt water ( tsp of salt per 1 qt of water) every 1-2 hours. This will help the pain.  If you are not at risk for choking, you can suck on hard candy or sore throat lozenges. Contact a doctor if:  You have large, tender lumps on your neck.  You have a rash.  You cough up green, yellow-brown, or bloody spit. Get help right away if:  You have a stiff neck.  You drool or cannot swallow liquids.  You throw up (vomit) or are not able to keep medicine or liquids down.  You have very bad pain that does not go away with medicine.  You have problems breathing (not from a stuffy nose). This information is not intended to replace advice given to you by your health care provider. Make sure you discuss  any questions you have with your health care provider. Document Released: 10/10/2007 Document Revised: 09/29/2015 Document Reviewed: 12/29/2012 Elsevier Interactive Patient Education  2017 [employer].   IF you received an x-ray today, you will receive an invoice from [facility]. Please contact [facility] at [phone] with questions or concerns regarding your invoice.   IF you received labwork today, you will receive an invoice from [facility]. Please contact [facility] at [phone] with questions or concerns regarding your invoice.   Our billing staff will not be able to assist you with questions regarding bills from these companies.  You will be contacted with the lab results as soon as they are available. The fastest way to get your results is to activate your My Chart account. Instructions are located on the last page of this paperwork. If you have not heard from [place] regarding the results in 2 weeks, please contact this office.

## 2018-03-06 LAB — EPSTEIN-BARR VIRUS VCA ANTIBODY PANEL
EBV NA IgG: 133 U/mL — ABNORMAL HIGH (ref 0.0–17.9)
EBV VCA IgG: 28.9 U/mL — ABNORMAL HIGH (ref 0.0–17.9)

## 2018-03-07 LAB — CULTURE, GROUP A STREP: Strep A Culture: POSITIVE — AB

## 2018-03-08 ENCOUNTER — Encounter: Payer: Self-pay | Admitting: Physician Assistant

## 2018-03-09 ENCOUNTER — Encounter: Payer: Self-pay | Admitting: Physician Assistant

## 2018-03-09 ENCOUNTER — Other Ambulatory Visit: Payer: Self-pay | Admitting: Physician Assistant

## 2018-03-09 MED ORDER — AMOXICILLIN 500 MG PO CAPS: 500.0000 mg | ORAL_CAPSULE | Freq: Two times a day (BID) | ORAL | 0 refills | Status: DC

## 2018-03-09 MED ORDER — FLUCONAZOLE 150 MG PO TABS: 150.0000 mg | ORAL_TABLET | Freq: Once | ORAL | 0 refills | Status: AC

## 2018-03-09 NOTE — Progress Notes (Signed)
Meds ordered this encounter  Medications  . amoxicillin (AMOXIL) 500 MG capsule    Sig: Take 1 capsule (500 mg total) by mouth 2 (two) times daily.    Dispense:  20 capsule    Refill:  0    Order Specific Question:   Supervising Provider    Answer:   Georgina Quint [id number]  . fluconazole (DIFLUCAN) 150 MG tablet    Sig: Take 1 tablet (150 mg total) by mouth once for 1 dose. Repeat if needed    Dispense:  2 tablet    Refill:  0    Order Specific Question:   Supervising Provider    Answer:   Georgina Quint [[id number]]

## 2018-03-09 NOTE — Progress Notes (Signed)
No orders of the defined types were placed in this encounter.

## 2018-03-10 NOTE — Telephone Encounter (Signed)
Copied from CRM [id number]. Topic: General - Inquiry >> Mar 10, 2018 10:43 AM Lynne Logan D wrote: Reason for CRM: Pt stated Barnett Abu was going to put a note for work/school on My Chart but has not seen it there yet. Also pt stated she is feeling better and wants to know if she should start the antibiotic. Please advise [phone]

## 2018-03-12 ENCOUNTER — Encounter: Payer: Self-pay | Admitting: Physician Assistant

## 2018-03-19 DIAGNOSIS — J039 Acute tonsillitis, unspecified: Secondary | ICD-10-CM | POA: Diagnosis not present

## 2018-03-28 DIAGNOSIS — J351 Hypertrophy of tonsils: Secondary | ICD-10-CM | POA: Diagnosis not present

## 2018-03-28 DIAGNOSIS — J3501 Chronic tonsillitis: Secondary | ICD-10-CM | POA: Diagnosis not present

## 2018-04-22 DIAGNOSIS — F411 Generalized anxiety disorder: Secondary | ICD-10-CM | POA: Diagnosis not present

## 2018-05-13 DIAGNOSIS — F411 Generalized anxiety disorder: Secondary | ICD-10-CM | POA: Diagnosis not present

## 2018-05-19 DIAGNOSIS — Z8049 Family history of malignant neoplasm of other genital organs: Secondary | ICD-10-CM | POA: Diagnosis not present

## 2018-05-19 DIAGNOSIS — R87612 Low grade squamous intraepithelial lesion on cytologic smear of cervix (LGSIL): Secondary | ICD-10-CM | POA: Diagnosis not present

## 2018-05-19 DIAGNOSIS — Z975 Presence of (intrauterine) contraceptive device: Secondary | ICD-10-CM | POA: Diagnosis not present

## 2018-05-19 DIAGNOSIS — N870 Mild cervical dysplasia: Secondary | ICD-10-CM | POA: Diagnosis not present

## 2018-05-19 DIAGNOSIS — Z1151 Encounter for screening for human papillomavirus (HPV): Secondary | ICD-10-CM | POA: Diagnosis not present

## 2018-05-19 DIAGNOSIS — Z113 Encounter for screening for infections with a predominantly sexual mode of transmission: Secondary | ICD-10-CM | POA: Diagnosis not present

## 2018-05-19 DIAGNOSIS — Z01419 Encounter for gynecological examination (general) (routine) without abnormal findings: Secondary | ICD-10-CM | POA: Diagnosis not present

## 2018-05-22 DIAGNOSIS — F411 Generalized anxiety disorder: Secondary | ICD-10-CM | POA: Diagnosis not present

## 2018-06-12 DIAGNOSIS — F411 Generalized anxiety disorder: Secondary | ICD-10-CM | POA: Diagnosis not present

## 2018-07-17 DIAGNOSIS — F411 Generalized anxiety disorder: Secondary | ICD-10-CM | POA: Diagnosis not present

## 2018-07-31 DIAGNOSIS — F411 Generalized anxiety disorder: Secondary | ICD-10-CM | POA: Diagnosis not present

## 2018-08-04 ENCOUNTER — Other Ambulatory Visit: Payer: Self-pay | Admitting: Physician Assistant

## 2018-08-04 DIAGNOSIS — F330 Major depressive disorder, recurrent, mild: Secondary | ICD-10-CM

## 2018-08-04 NOTE — Telephone Encounter (Signed)
Requested medication (s) are due for refill today: yes  Requested medication (s) are on the active medication list: yes  Last refill:  02/11/18  Future visit scheduled: no  Notes to clinic:  Medication not delegated to NT to refill   Requested Prescriptions  Pending Prescriptions Disp Refills   buPROPion (WELLBUTRIN XL) 150 MG 24 hr tablet [Pharmacy Med Name: buPROPion HCL XL 150 MG TABLET] 90 tablet 0    Sig: TAKE ONE TABLET BY MOUTH DAILY     Psychiatry: Antidepressants - bupropion Passed - 08/04/2018 11:57 AM      Passed - Last BP in normal range    BP Readings from Last 1 Encounters:  03/05/18 104/69         Passed - Valid encounter within last 6 months    Recent Outpatient Visits          5 months ago Acute pharyngitis, unspecified etiology   Primary Care at [place], [place] D, PA-C   5 months ago Bacterial vaginitis   Primary Care at [facility], [place], PA-C   5 months ago Mild recurrent major depression [facility])   Primary Care at [place], [place] D, PA-C   7 months ago Herpetic whitlow   Primary Care at [facility], [place], [place]   8 months ago Depression, unspecified depression type   Primary Care at [facility], [place], [place]

## 2018-08-05 NOTE — Telephone Encounter (Signed)
No further refills without office visit 

## 2018-08-14 DIAGNOSIS — F411 Generalized anxiety disorder: Secondary | ICD-10-CM | POA: Diagnosis not present

## 2018-09-04 DIAGNOSIS — F411 Generalized anxiety disorder: Secondary | ICD-10-CM | POA: Diagnosis not present

## 2018-09-18 DIAGNOSIS — F411 Generalized anxiety disorder: Secondary | ICD-10-CM | POA: Diagnosis not present

## 2018-09-22 DIAGNOSIS — R87612 Low grade squamous intraepithelial lesion on cytologic smear of cervix (LGSIL): Secondary | ICD-10-CM | POA: Diagnosis not present

## 2018-09-22 DIAGNOSIS — R87611 Atypical squamous cells cannot exclude high grade squamous intraepithelial lesion on cytologic smear of cervix (ASC-H): Secondary | ICD-10-CM | POA: Diagnosis not present

## 2018-10-02 DIAGNOSIS — F411 Generalized anxiety disorder: Secondary | ICD-10-CM | POA: Diagnosis not present

## 2018-10-06 ENCOUNTER — Ambulatory Visit: Payer: BLUE CROSS/BLUE SHIELD | Admitting: Registered Nurse

## 2018-10-06 ENCOUNTER — Encounter: Payer: Self-pay | Admitting: Registered Nurse

## 2018-10-06 ENCOUNTER — Other Ambulatory Visit: Payer: Self-pay

## 2018-10-06 VITALS — BP 102/72 | HR 78 | Temp 98.6°F | Resp 16 | Ht 63.0 in | Wt 180.0 lb

## 2018-10-06 DIAGNOSIS — F330 Major depressive disorder, recurrent, mild: Secondary | ICD-10-CM

## 2018-10-06 DIAGNOSIS — F411 Generalized anxiety disorder: Secondary | ICD-10-CM

## 2018-10-06 DIAGNOSIS — Z7689 Persons encountering health services in other specified circumstances: Secondary | ICD-10-CM

## 2018-10-06 MED ORDER — BUSPIRONE HCL 10 MG PO TABS
10.0000 mg | ORAL_TABLET | Freq: Two times a day (BID) | ORAL | 0 refills | Status: DC
Start: 2018-10-06 — End: 2018-12-02

## 2018-10-06 NOTE — Progress Notes (Signed)
Established Patient Office Visit  Subjective:  Patient ID: Briana Reed, female    DOB: 1986-01-03  Age: 33 y.o. MRN: [id number]  CC:  Chief Complaint  Patient presents with  . Transitions Of Care    need new pcp to manage Anxiety   . Medication Refill    HPI [name] presents to establish care with myself as PCP, discuss medications, and rash  Pt has hx significant for seasonal depression, treated with Wellbutrin 151m XR PO qd. This has had good effect Pt wishes to discuss cessation of this medication. We discussed a taper of two weeks with this medication if she chooses to come off of it.  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 We discussed Buspar as a PRN   She has a small, linear rash on her finger tip on her R hand. It is comprised of small, firm bumps that appear to be emerging vesicles. She has has a hx of herpetic whitlow on her other hand - states that this does not feel similar. Denies itching, purulence. States that one bump has been erythematic. No known exposure to chemicals or plants - but she does enjoy spending time outside.   Past Medical History:  Diagnosis Date  . Anemia   . Anxiety   . Depression   . GERD (gastroesophageal reflux disease)   . History of Papanicolaou smear of cervix 10/28/12; 08/02/15   NEG; LGSIL, HPV  . Immunization, viral disease    GARDASIL COMPLETED  . Vitamin D deficiency 07/2011    Past Surgical History:  Procedure Laterality Date  . ESOPHAGOGASTRODUODENOSCOPY  2008   WNL    Family History  Problem Relation Age of Onset  . Skin cancer Mother 466      MELANOMA  . Hypertension Mother   . Endometrial cancer Mother        on radiation treatments  . Uterine cancer Mother 684 . Hyperlipidemia Mother   . Lung cancer Maternal Grandmother 80  . Uterine cancer Maternal Grandmother 632 .  Hypertension Father   . Stroke Father   . Hyperlipidemia Father   . Heart attack Maternal Grandfather   . Heart attack Paternal Grandmother   . Heart attack Paternal Grandfather     Social History   Socioeconomic History  . Marital status: Single    Spouse name: Not on file  . Number of children: 0  . Years of education: 119 . Highest education level: Associate degree: occupational, tHotel manager or vocational program  Occupational History  . Occupation: bartender     Comment: [employer] . Financial resource strain: Not very hard  . Food insecurity:    Worry: Never true    Inability: Never true  . Transportation needs:    Medical: No    Non-medical: No  Tobacco Use  . Smoking status: Never Smoker  . Smokeless tobacco: Never Used  Substance and Sexual Activity  . Alcohol use: Yes    Alcohol/week: 2.0 standard drinks    Types: 2 Glasses of wine per week  . Drug use: No  . Sexual activity: Yes    Partners: Male    Birth control/protection: I.U.D.  Lifestyle  . Physical activity:    Days per week: 3 days    Minutes per session: 60 min  . Stress: Very  much  Relationships  . Social connections:    Talks on phone: More than three times a week    Gets together: More than three times a week    Attends religious service: Never    Active member of club or organization: Yes    Attends meetings of clubs or organizations: More than 4 times per year    Relationship status: Divorced  . Intimate partner violence:    Fear of current or ex partner: No    Emotionally abused: No    Physically abused: No    Forced sexual activity: No  Other Topics Concern  . 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 buPROPion (WELLBUTRIN XL) 150 MG 24 hr tablet TAKE ONE TABLET BY MOUTH DAILY 90 tablet 0  . Cholecalciferol (VITAMIN D3) 1000 units CAPS Take by mouth.    . Loratadine 10 MG CAPS 10 mg daily.    . Multiple Vitamins-Minerals (WOMENS MULTIVITAMIN) TABS     . levonorgestrel (MIRENA, 52 MG,) 20 MCG/24HR IUD 1 Intra Uterine Device (1 each total) by Intrauterine route once for 1 dose. 1 Intra Uterine Device 0  . amoxicillin (AMOXIL) 500 MG capsule Take 1 capsule (500 mg total) by mouth 2 (two) times daily. 20 capsule 0  . BIOTIN SL Place under the tongue daily.    Marland Kitchen ibuprofen (ADVIL,MOTRIN) 800 MG tablet Take 1 tablet (800 mg total) by mouth every 8 (eight) hours as needed. 30 tablet 0  . metroNIDAZOLE (FLAGYL) 500 MG tablet Take 1 tablet (500 mg total) by mouth 2 (two) times daily with a meal. DO NOT CONSUME ALCOHOL WHILE TAKING THIS MEDICATION. 14 tablet 0   No facility-administered medications prior to visit.     No Known Allergies  ROS Review of Systems  Constitutional: Negative.   HENT: Negative.   Eyes: Negative.   Respiratory: Negative.   Cardiovascular: Negative.   Gastrointestinal: Negative.   Endocrine: Negative.   Genitourinary: Negative.   Musculoskeletal: Negative.   Skin: Negative.   Allergic/Immunologic: Negative.   Neurological: Negative.   Hematological: Negative.   Psychiatric/Behavioral: Negative for suicidal ideas. The patient is nervous/anxious.       Objective:    Physical Exam  Constitutional: She is oriented to person, place, and time. She appears well-developed and well-nourished.  HENT:  Head: Normocephalic and atraumatic.  Cardiovascular: Normal rate, regular rhythm, normal heart sounds and intact distal pulses. Exam reveals no gallop and no friction rub.  No murmur heard. Pulmonary/Chest: Effort normal and breath sounds normal. No respiratory distress. She has no wheezes. She has no rales. She exhibits no tenderness.   Abdominal: Soft. Bowel sounds are normal. She exhibits no distension. There is no abdominal tenderness. There is no guarding.  Musculoskeletal: Normal range of motion.  Neurological: She is alert and oriented to person, place, and time.  Skin: Skin is warm and dry.     Psychiatric: She has a normal mood and affect. Her behavior is normal. Judgment and thought content normal.  Nursing note and vitals reviewed.   BP 102/72   Pulse 78   Temp 98.6 F (37 C)   Resp 16  Ht _0  (1.6 m)   Wt 180 lb (81.6 kg)   SpO2 99%   BMI 31.89 kg/m  Wt Readings from Last 3 Encounters:  10/06/18 180 lb (81.6 kg)  02/25/18 183 lb (83 kg)  02/11/18 185 lb 3.2 oz (84 kg)     Health Maintenance Due  Topic Date Due  . PAP SMEAR-Modifier  08/06/2018    There are no preventive care reminders to display for this patient.  Lab Results  Component Value Date   TSH 0.818 02/11/2018   Lab Results  Component Value Date   WBC 6.8 02/11/2018   HGB 13.3 02/11/2018   HCT 39.1 02/11/2018   MCV 96 02/11/2018   PLT 336 02/11/2018   Lab Results  Component Value Date   NA 141 02/11/2018   K 4.3 02/11/2018   CO2 21 02/11/2018   GLUCOSE 98 02/11/2018   BUN 8 02/11/2018   CREATININE 0.61 02/11/2018   BILITOT 0.2 02/11/2018   ALKPHOS 43 02/11/2018   AST 12 02/11/2018   ALT 13 02/11/2018   PROT 7.2 02/11/2018   ALBUMIN 4.5 02/11/2018   CALCIUM 9.7 02/11/2018   Lab Results  Component Value Date   CHOL 140 06/12/2017   Lab Results  Component Value Date   HDL 71 06/12/2017   Lab Results  Component Value Date   LDLCALC 55 06/12/2017   Lab Results  Component Value Date   TRIG 60 06/12/2017   Lab Results  Component Value Date   CHOLHDL 2.0 06/12/2017   Lab Results  Component Value Date   HGBA1C 5.0 06/12/2017      Assessment & Plan:   Problem List Items Addressed This Visit      Other   Mild recurrent major depression ([place])      No orders of the defined types were placed  in this encounter.   Follow-up: Return in about 6 months (around 04/07/2019).   PLAN:  Rash: Likely contact dermatitis - appears to be poison ivy / poison oak exposure, given patient's tendency to be outdoors, petting dogs. Will continue to monitor, but likely benign and will resolve on it's own. OTCs if itchy. Discussed reasons to return to clinic.  Anxiety/Depression: Patient states she may discontinue wellbutrin - discussed taper. She will message on MyChart or call clinic to discuss any changes.   Low Vit D - pt was marginally low at last visit - discussed she has been taking Vit D supplements daily since, has been outdoors nearly daily. At this time she is concerned about cost d/t changing work situation and deductible not being met - we agreed to delay testing today for future.  Patient encouraged to call clinic with any questions, comments, or concerns.  Thank you for your visit with Primary Care at [facility] today.  Maximiano Coss, NP [facility] Primary Care at [place], [place] [address] [phone] - 0000

## 2018-10-06 NOTE — Patient Instructions (Signed)
° ° ° °  If you have lab work done today you will be contacted with your lab results within the next 2 weeks.  If you have not heard from us then please contact us. The fastest way to get your results is to register for My Chart. ° ° °IF you received an x-ray today, you will receive an invoice from [facility]. Please contact [facility] at [phone] with questions or concerns regarding your invoice.  ° °IF you received labwork today, you will receive an invoice from [facility]. Please contact [facility] at [phone] with questions or concerns regarding your invoice.  ° °Our billing staff will not be able to assist you with questions regarding bills from these companies. ° °You will be contacted with the lab results as soon as they are available. The fastest way to get your results is to activate your My Chart account. Instructions are located on the last page of this paperwork. If you have not heard from us regarding the results in 2 weeks, please contact this office. °  ° ° ° °

## 2018-11-06 DIAGNOSIS — F411 Generalized anxiety disorder: Secondary | ICD-10-CM | POA: Diagnosis not present

## 2018-11-15 IMAGING — DX DG FINGER MIDDLE 2+V*L*
3 series · 3 of 3 positions shown · non-contrast
Comparison: None.

CLINICAL DATA: Left finger pain and swelling

EXAM:
LEFT MIDDLE FINGER 2+V

[finger ap]
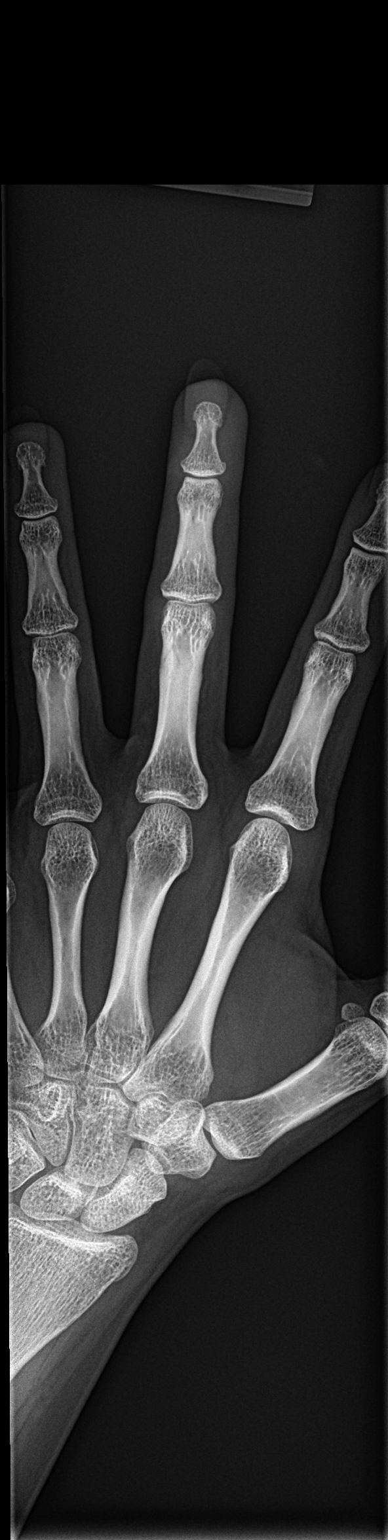

[finger obl]
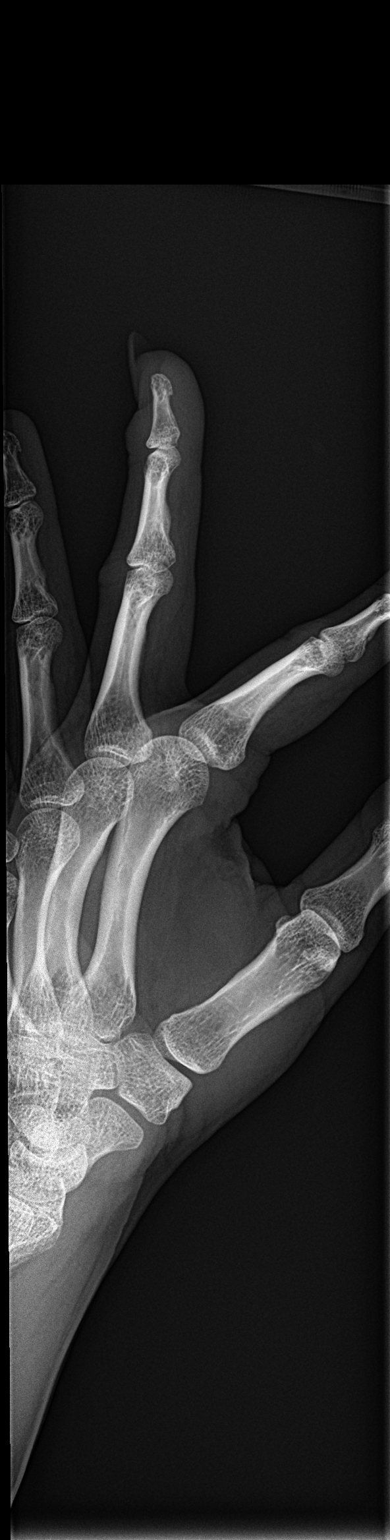

[finger lat]
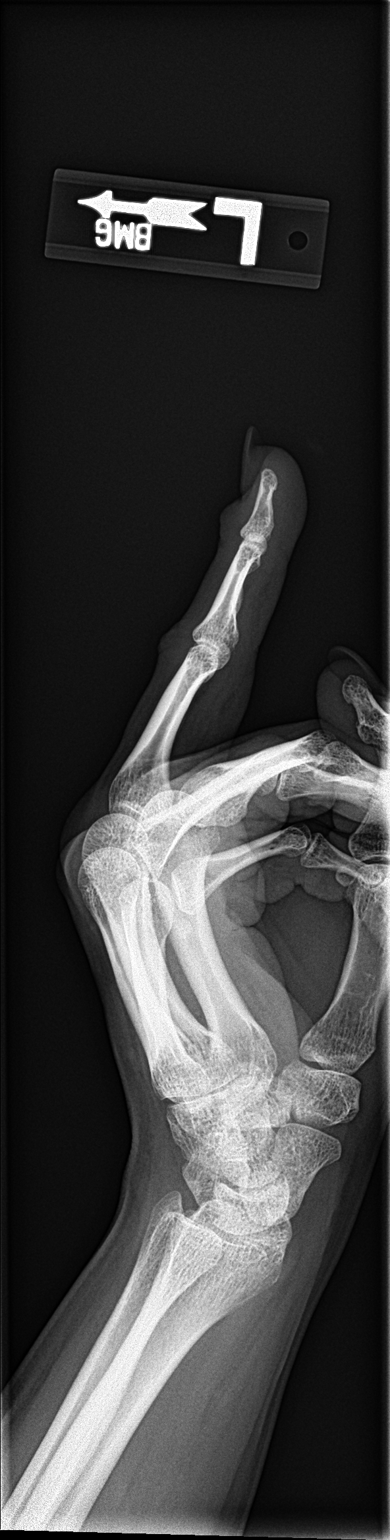

[3 of 3 positions shown; findings below may reference images not displayed]

FINDINGS: There is no evidence of fracture or dislocation. There is no
evidence of arthropathy or other focal bone abnormality. Soft
tissues are unremarkable.
IMPRESSION: Negative.

## 2018-11-20 DIAGNOSIS — F411 Generalized anxiety disorder: Secondary | ICD-10-CM | POA: Diagnosis not present

## 2018-12-02 ENCOUNTER — Other Ambulatory Visit: Payer: Self-pay

## 2018-12-02 ENCOUNTER — Encounter: Payer: Self-pay | Admitting: Registered Nurse

## 2018-12-02 ENCOUNTER — Ambulatory Visit: Payer: BC Managed Care – PPO | Admitting: Registered Nurse

## 2018-12-02 VITALS — BP 103/70 | HR 69 | Temp 99.0°F | Resp 16 | Ht 64.37 in

## 2018-12-02 DIAGNOSIS — F411 Generalized anxiety disorder: Secondary | ICD-10-CM | POA: Diagnosis not present

## 2018-12-02 NOTE — Progress Notes (Signed)
Established Patient Office Visit  Subjective:  Patient ID: Briana Reed, female    DOB: [birth year]  Age: 33 y.o. MRN: [id number]  CC:  Chief Complaint  Patient presents with  . Medication Management    HPI [name] presents for ongoing anxiety  She did pursue a taper off of her Wellbutrin after our visit in early June. 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 She has daily stress regarding COVID-19 that has forced her to reduce her workload from 5 shifts each week to 3 - she has felt unmotivated at home and has had trouble focusing, stating that she gets teary on a daily basis. Denies SI/self harm intentions  Past Medical History:  Diagnosis Date  . Anemia   . Anxiety   . Depression   . GERD (gastroesophageal reflux disease)   . History of Papanicolaou smear of cervix 10/28/12; 08/02/15   NEG; LGSIL, HPV  . Immunization, viral disease    GARDASIL COMPLETED  . Vitamin D deficiency 07/2011    Past Surgical History:  Procedure Laterality Date  . ESOPHAGOGASTRODUODENOSCOPY  2008   WNL    Family History  Problem Relation Age of Onset  . Skin cancer Mother 73       MELANOMA  . Hypertension Mother   . Endometrial cancer Mother        on radiation treatments  . Uterine cancer Mother 34  . Hyperlipidemia Mother   . Lung cancer Maternal Grandmother 80  . Uterine cancer Maternal Grandmother 48  . Hypertension Father   . Stroke Father   . Hyperlipidemia Father   . Heart attack Maternal Grandfather   . Heart attack Paternal Grandmother   . Heart attack Paternal Grandfather     Social History   Socioeconomic History  . Marital status: Single    Spouse name: Not on file  . Number of children: 0  . Years of education: 61  . Highest education level: Associate degree: occupational, Hotel manager, or vocational program  Occupational History  . Occupation: bartender     Comment: [employer]  . Financial resource strain: Not very hard  . Food insecurity    Worry: Never true    Inability: Never true  . Transportation needs    Medical: No    Non-medical: No  Tobacco Use  . Smoking status: Never Smoker  . Smokeless tobacco: Never Used  Substance and Sexual Activity  . Alcohol use: Yes    Alcohol/week: 2.0 standard drinks    Types: 2 Glasses of wine per week  . Drug use: No  . Sexual activity: Yes    Partners: Male    Birth control/protection: I.U.D.  Lifestyle  . Physical activity    Days per week: 3 days    Minutes per session: 60 min  . Stress: Very much  Relationships  . Social connections    Talks on phone: More than three times a week    Gets together: More than three times a week    Attends religious service: Never    Active member of club or organization: Yes    Attends meetings of clubs or organizations: More than 4 times per year    Relationship status: Divorced  . Intimate partner violence    Fear of current or ex partner: No    Emotionally abused: No    Physically abused:  No    Forced sexual activity: No  Other Topics Concern  . 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 Cholecalciferol (VITAMIN D3) 1000 units CAPS Take by mouth.    . Multiple Vitamin (MULTI-VITAMIN) tablet Take by mouth.    . Multiple Vitamins-Minerals (WOMENS MULTIVITAMIN) TABS     . levonorgestrel (MIRENA, 52 MG,) 20 MCG/24HR IUD 1 Intra Uterine Device (1 each total) by Intrauterine route once for 1 dose. 1 Intra Uterine Device 0  . buPROPion (WELLBUTRIN XL) 150 MG 24 hr tablet TAKE ONE TABLET BY MOUTH DAILY 90 tablet 0  . busPIRone (BUSPAR) 10 MG tablet Take  1 tablet (10 mg total) by mouth 2 (two) times daily. Take as needed, can split tabs if desired. 90 tablet 0  . Loratadine 10 MG CAPS 10 mg daily.     No facility-administered medications prior to visit.     No Known Allergies  ROS Review of Systems  Constitutional: Negative.   HENT: Negative.   Eyes: Negative.   Respiratory: Negative.   Cardiovascular: Negative.   Gastrointestinal: Negative.   Endocrine: Negative.   Genitourinary: Negative.   Musculoskeletal: Negative.   Skin: Negative.   Allergic/Immunologic: Negative.   Neurological: Negative.   Hematological: Negative.   Psychiatric/Behavioral: Negative.   All other systems reviewed and are negative.     Objective:    Physical Exam  Constitutional: She is oriented to person, place, and time. She appears well-developed and well-nourished.  Cardiovascular: Normal rate and regular rhythm.  Pulmonary/Chest: Effort normal. No respiratory distress.  Neurological: She is alert and oriented to person, place, and time.  Skin: Skin is warm and dry. No rash noted. No erythema. No pallor.  Psychiatric: Her speech is normal. Judgment and thought content normal. Her mood appears anxious. Her affect is not angry, not blunt, not labile and not inappropriate. Her speech is not rapid and/or pressured, not delayed, not tangential and not slurred. She is withdrawn. She is not agitated, not aggressive, not hyperactive, not slowed, not actively hallucinating and not combative. Thought content is not delusional. Cognition and memory are normal. Cognition and memory are not impaired. She does not express impulsivity or inappropriate judgment. She exhibits a depressed mood. She expresses no homicidal and no suicidal ideation. She expresses no suicidal plans. She is communicative. She exhibits normal recent memory and normal remote memory. She is attentive.    BP 103/70   Pulse 69   Temp 99 F (37.2 C) (Oral)   Resp 16   Ht 5' 4.37" (1.635 m)    SpO2 98%   BMI 30.54 kg/m  Wt Readings from Last 3 Encounters:  10/06/18 180 lb (81.6 kg)  02/25/18 183 lb (83 kg)  02/11/18 185 lb 3.2 oz (84 kg)     Health Maintenance Due  Topic Date Due  . PAP SMEAR-Modifier  08/06/2018    There are no preventive care reminders to display for this patient.  Lab Results  Component Value Date   TSH 0.818 02/11/2018   Lab Results  Component Value Date   WBC 6.8 02/11/2018   HGB 13.3 02/11/2018   HCT 39.1 02/11/2018  MCV 96 02/11/2018   PLT 336 02/11/2018   Lab Results  Component Value Date   NA 141 02/11/2018   K 4.3 02/11/2018   CO2 21 02/11/2018   GLUCOSE 98 02/11/2018   BUN 8 02/11/2018   CREATININE 0.61 02/11/2018   BILITOT 0.2 02/11/2018   ALKPHOS 43 02/11/2018   AST 12 02/11/2018   ALT 13 02/11/2018   PROT 7.2 02/11/2018   ALBUMIN 4.5 02/11/2018   CALCIUM 9.7 02/11/2018   Lab Results  Component Value Date   CHOL 140 06/12/2017   Lab Results  Component Value Date   HDL 71 06/12/2017   Lab Results  Component Value Date   LDLCALC 55 06/12/2017   Lab Results  Component Value Date   TRIG 60 06/12/2017   Lab Results  Component Value Date   CHOLHDL 2.0 06/12/2017   Lab Results  Component Value Date   HGBA1C 5.0 06/12/2017      Assessment & Plan:   Problem List Items Addressed This Visit    None      No orders of the defined types were placed in this encounter.   Follow-up: No follow-ups on file.   PLAN:  Restart Wellbutrin. Will follow up in around 1 month to discuss dose changes, adjunctive medications, and effect at that time. This medication has worked well for her in the past so we are hoping it will once again be effective.  She is pursuing therapy regularly and working on self-care. She was formerly very active in her social life, which is one big aspect of what is missing for her at this time.  Patient encouraged to call clinic with any questions, comments, or concerns.    Janeece Ageeichard  Breyonna Nault, NP

## 2018-12-02 NOTE — Patient Instructions (Signed)
° ° ° °  If you have lab work done today you will be contacted with your lab results within the next 2 weeks.  If you have not heard from us then please contact us. The fastest way to get your results is to register for My Chart. ° ° °IF you received an x-ray today, you will receive an invoice from [facility]. Please contact [facility] at [phone] with questions or concerns regarding your invoice.  ° °IF you received labwork today, you will receive an invoice from [facility]. Please contact [facility] at [phone] with questions or concerns regarding your invoice.  ° °Our billing staff will not be able to assist you with questions regarding bills from these companies. ° °You will be contacted with the lab results as soon as they are available. The fastest way to get your results is to activate your My Chart account. Instructions are located on the last page of this paperwork. If you have not heard from us regarding the results in 2 weeks, please contact this office. °  ° ° ° °

## 2018-12-04 DIAGNOSIS — F411 Generalized anxiety disorder: Secondary | ICD-10-CM | POA: Diagnosis not present

## 2018-12-10 ENCOUNTER — Other Ambulatory Visit: Payer: Self-pay | Admitting: Family Medicine

## 2018-12-10 DIAGNOSIS — F330 Major depressive disorder, recurrent, mild: Secondary | ICD-10-CM

## 2018-12-10 NOTE — Telephone Encounter (Signed)
Requested medications are due for refill today?  N/A  Requested medications are on the active medication list?  No  Last refill N/A  Future visit scheduled?  Yes - 01/02/2019   Notes to clinic- Medication is not on patient's active medication list.    Requested Prescriptions  Pending Prescriptions Disp Refills   buPROPion (WELLBUTRIN XL) 150 MG 24 hr tablet [Pharmacy Med Name: buPROPion HCL XL 150 MG TABLET] 90 tablet 0    Sig: TAKE ONE TABLET BY MOUTH DAILY     Psychiatry: Antidepressants - bupropion Passed - 12/10/2018 11:19 AM      Passed - Last BP in normal range    BP Readings from Last 1 Encounters:  12/02/18 103/70         Passed - Valid encounter within last 6 months    Recent Outpatient Visits          1 week ago Anxiety state   Primary Care at [place], NP   2 months ago Encounter to establish care   [facility] at [facility], Delfino Lov[place], NP   9 months ago Acute pharyngitis, unspecified etiology   Primary Care at [facility], [place] D, PA-C   9 months ago Bacterial vaginitis   Primary Care at [facility], [place], PA-C   10 months ago Mild recurrent major depression [facility])   Primary Care at [place], [place] D, PA-C             Passed - Completed PHQ-2 or PHQ-9 in the last 360 days.

## 2018-12-14 ENCOUNTER — Encounter: Payer: Self-pay | Admitting: Registered Nurse

## 2018-12-15 ENCOUNTER — Other Ambulatory Visit: Payer: Self-pay | Admitting: Registered Nurse

## 2018-12-15 DIAGNOSIS — F411 Generalized anxiety disorder: Secondary | ICD-10-CM

## 2018-12-15 MED ORDER — BUPROPION HCL ER (SR) 150 MG PO TB12: 150.0000 mg | ORAL_TABLET | Freq: Two times a day (BID) | ORAL | 1 refills | Status: DC

## 2018-12-15 NOTE — Progress Notes (Signed)
wellbutrin 150mg  12h tab refilled x 90 days Pt already has f.u scheduled with myself  Kathrin Ruddy, NP

## 2018-12-25 DIAGNOSIS — F411 Generalized anxiety disorder: Secondary | ICD-10-CM | POA: Diagnosis not present

## 2019-01-02 ENCOUNTER — Telehealth (INDEPENDENT_AMBULATORY_CARE_PROVIDER_SITE_OTHER): Payer: BC Managed Care – PPO | Admitting: Registered Nurse

## 2019-01-02 ENCOUNTER — Other Ambulatory Visit: Payer: Self-pay

## 2019-01-02 ENCOUNTER — Encounter: Payer: Self-pay | Admitting: Registered Nurse

## 2019-01-02 NOTE — Progress Notes (Signed)
Spoke with pt and she states she need follow-up on her medication. She states she feels it has been doing ok. She states she has no other concerns at this time.

## 2019-01-05 DIAGNOSIS — F411 Generalized anxiety disorder: Secondary | ICD-10-CM | POA: Diagnosis not present

## 2019-02-11 ENCOUNTER — Encounter: Payer: Self-pay | Admitting: Physician Assistant

## 2019-02-11 ENCOUNTER — Other Ambulatory Visit: Payer: Self-pay

## 2019-02-11 ENCOUNTER — Ambulatory Visit (INDEPENDENT_AMBULATORY_CARE_PROVIDER_SITE_OTHER): Payer: BC Managed Care – PPO | Admitting: Physician Assistant

## 2019-02-11 VITALS — BP 110/78 | HR 80 | Temp 98.7°F | Ht 64.0 in | Wt 168.0 lb

## 2019-02-11 DIAGNOSIS — E559 Vitamin D deficiency, unspecified: Secondary | ICD-10-CM

## 2019-02-11 DIAGNOSIS — F330 Major depressive disorder, recurrent, mild: Secondary | ICD-10-CM | POA: Diagnosis not present

## 2019-02-11 LAB — CBC WITH DIFFERENTIAL/PLATELET
Basophils Absolute: 0 10*3/uL (ref 0.0–0.1)
Basophils Relative: 0.4 % (ref 0.0–3.0)
Eosinophils Absolute: 0 10*3/uL (ref 0.0–0.7)
Eosinophils Relative: 0.3 % (ref 0.0–5.0)
HCT: 39.7 % (ref 36.0–46.0)
Hemoglobin: 13.4 g/dL (ref 12.0–15.0)
Lymphocytes Relative: 21.1 % (ref 12.0–46.0)
Lymphs Abs: 1.5 10*3/uL (ref 0.7–4.0)
MCHC: 33.7 g/dL (ref 30.0–36.0)
MCV: 96.6 fl (ref 78.0–100.0)
Monocytes Absolute: 0.5 10*3/uL (ref 0.1–1.0)
Monocytes Relative: 7.1 % (ref 3.0–12.0)
Neutro Abs: 5.1 10*3/uL (ref 1.4–7.7)
Neutrophils Relative %: 71.1 % (ref 43.0–77.0)
Platelets: 319 10*3/uL (ref 150.0–400.0)
RBC: 4.11 Mil/uL (ref 3.87–5.11)
RDW: 12.9 % (ref 11.5–15.5)
WBC: 7.1 10*3/uL (ref 4.0–10.5)

## 2019-02-11 LAB — COMPREHENSIVE METABOLIC PANEL
ALT: 8 U/L (ref 0–35)
AST: 11 U/L (ref 0–37)
Albumin: 4.6 g/dL (ref 3.5–5.2)
Alkaline Phosphatase: 52 U/L (ref 39–117)
BUN: 9 mg/dL (ref 6–23)
CO2: 24 mEq/L (ref 19–32)
Calcium: 9.8 mg/dL (ref 8.4–10.5)
Chloride: 105 mEq/L (ref 96–112)
Creatinine, Ser: 0.62 mg/dL (ref 0.40–1.20)
GFR: 111.16 mL/min (ref >60.00)
Glucose, Bld: 95 mg/dL (ref 70–99)
Potassium: 4.1 mEq/L (ref 3.5–5.1)
Sodium: 138 mEq/L (ref 135–145)
Total Bilirubin: 0.5 mg/dL (ref 0.2–1.2)
Total Protein: 7.3 g/dL (ref 6.0–8.3)

## 2019-02-11 LAB — VITAMIN D 25 HYDROXY (VIT D DEFICIENCY, FRACTURES): VITD: 22.12 ng/mL — ABNORMAL LOW (ref 30.00–100.00)

## 2019-02-11 NOTE — Progress Notes (Signed)
Briana Reed is a 33 y.o. female here to Establish Care.  I acted as a Neurosurgeon for [employer], PA-C Corky Mull, LPN  History of Present Illness:   Chief Complaint  Patient presents with  . Establish Care  . Depression  . Anxiety    Depression & Anxiety She is currently taking Wellbutrin SR 150 mg BID since July 28th. Pt feels like it is helping her.  She has been on multiple medications for depression anxiety in the past.  At age 15 she was hospitalized for suicidal ideation.  She denies any suicidal ideation since that time.  She recently ended a 4-1/2-year relationship, and also has had to increase her responsibilities in order to help pay for her ex-boyfriend's portion of rent and other bills.  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  She has a roommate moving in November to help pay for rent.  She does have a history of binging and purging, use laxatives in high school.  She has family history of depression.  She sees a therapist every other week, and is interested in seeing a new therapist.  She does feel forgetful at times.  Has had some issues with pain attacks but none recently.  No excessive alcohol intake or drug abuse.  She has tried trazodone in the past without relief of insomnia.  Overall she feels that her sleep is okay.  Past medications include Prozac, Celexa, Lexapro, Zoloft, Effexor.  She tolerated all of them, except had a very difficult time coming off of Effexor and does not want to resume this.  Vitamin D deficiency History of taking 50,000 units and is wondering if she needs to resume this high dose.  Would like her vitamin D checked today.  Health Maintenance: Immunizations -- UTD, declines Flu vaccine Colonoscopy -- N/A Mammogram -- N/A PAP -- UTD, done 08/2017, LGSIL/+HPV, had colpo and biopsies done Dx: Mild Dysplasia/CIN I, due for pap end of month. Weight -- Weight: 168 lb (76.2 kg)   Depression screen PHQ 2/9 02/11/2019  Decreased  Interest 1  Down, Depressed, Hopeless 1  PHQ - 2 Score 2  Altered sleeping 2  Tired, decreased energy 3  Change in appetite 2  Feeling bad or failure about yourself  0  Trouble concentrating 3  Moving slowly or fidgety/restless 0  Suicidal thoughts 0  PHQ-9 Score 12  Difficult doing work/chores Very difficult  Some recent data might be hidden    GAD 7 : Generalized Anxiety Score 02/11/2019 02/11/2018  Nervous, Anxious, on Edge 3 1  Control/stop worrying 3 0  Worry too much - different things 3 1  Trouble relaxing 3 2  Restless 2 0  Easily annoyed or irritable 2 0  Afraid - awful might happen 1 0  Total GAD 7 Score 17 4  Anxiety Difficulty Very difficult Not difficult at all     Other providers/specialists: Patient Care Team: Jarold Motto, [place] as PCP - General (Physician Assistant)   Past Medical History:  Diagnosis Date  . Anemia   . Anxiety   . Depression   . GERD (gastroesophageal reflux disease)   . History of Papanicolaou smear of cervix 10/28/12; 08/02/15   NEG; LGSIL, HPV  . Immunization, viral disease    GARDASIL COMPLETED  . Vitamin D deficiency 07/2011     Social History   Socioeconomic History  . Marital status: Single    Spouse name: Not on file  . Number of children: 0  .  Years of education: 2514  . Highest education level: Associate degree: occupational, Scientist, product/process developmenttechnical, or vocational program  Occupational History  . 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 Financial resource strain: Not very hard  . Food insecurity    Worry: Never true    Inability: Never true  . Transportation needs    Medical: No    Non-medical: No  Tobacco Use  . Smoking status: Never Smoker  . Smokeless tobacco: Never Used  Substance and Sexual Activity  . Alcohol use: Yes    Alcohol/week: 2.0 standard drinks    Types: 2 Glasses of wine per week  . Drug use: No  . Sexual activity: Yes    Partners: Male    Birth control/protection: I.U.D.   Lifestyle  . Physical activity    Days per week: 3 days    Minutes per session: 60 min  . Stress: Very much  Relationships  . Social connections    Talks on phone: More than three times a week    Gets together: More than three times a week    Attends religious service: Never    Active member of club or organization: Yes    Attends meetings of clubs or organizations: More than 4 times per year    Relationship status: Divorced  . Intimate partner violence    Fear of current or ex partner: No    Emotionally abused: No    Physically abused: No    Forced sexual activity: No  Other Topics Concern  . 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 ESOPHAGOGASTRODUODENOSCOPY  2008   WNL    Family History  Problem Relation Age of Onset  . Skin cancer Mother 2040       MELANOMA  . Hypertension Mother   . Endometrial cancer Mother        on radiation treatments  . Uterine cancer Mother 6265  . Hyperlipidemia Mother   . Lung cancer Maternal Grandmother 80  . Uterine cancer Maternal Grandmother 65  . Hypertension Father   . Stroke Father   . Hyperlipidemia Father   . Heart attack Maternal Grandfather   . Heart attack Paternal Grandmother   . Heart attack Paternal Grandfather     No Known Allergies   Current Medications:   Current Outpatient Medications:  .  ASHWAGANDHA PO, Take 1 tablet by mouth daily. , Disp: , Rfl:  .  buPROPion (WELLBUTRIN SR) 150 MG 12 hr tablet, Take 1 tablet (150 mg total) by mouth 2 (two) times daily., Disp: 90 tablet, Rfl: 1 .  Cholecalciferol (VITAMIN D3) 1000 units CAPS, Take by mouth., Disp: , Rfl:  .  Lactobacillus-Inulin (CULTURELLE DIGESTIVE HEALTH PO), Take 1 capsule by mouth  daily. , Disp: , Rfl:  .  levonorgestrel (MIRENA, 52 MG,) 20 MCG/24HR IUD, 1 Intra Uterine Device (1 each total) by Intrauterine route once for 1 dose. (Patient taking differently: 1 each by Intrauterine route once. Inserted 08/12/2017 by GYN, needs to be removed 08/2022.), Disp: 1 Intra Uterine Device, Rfl: 0   Review of Systems:   ROS Negative unless otherwise specified per HPI.  Vitals:  Vitals:   02/11/19 1315  BP: 110/78  Pulse: 80  Temp: 98.7 F (37.1 C)  TempSrc: Temporal  SpO2: 99%  Weight: 168 lb (76.2 kg)  Height: 5\' 4"  (1.626 m)      Body mass index is 28.84 kg/m.  Physical Exam:   Physical Exam Vitals signs and nursing note reviewed.  Constitutional:      General: She is not in acute distress.    Appearance: She is well-developed. She is not ill-appearing or toxic-appearing.  Cardiovascular:     Rate and Rhythm: Normal rate and regular rhythm.     Pulses: Normal pulses.     Heart sounds: Normal heart sounds, S1 normal and S2 normal.     Comments: No LE edema Pulmonary:     Effort: Pulmonary effort is normal.     Breath sounds: Normal breath sounds.  Skin:    General: Skin is warm and dry.  Neurological:     Mental Status: She is alert.     GCS: GCS eye subscore is 4. GCS verbal subscore is 5. GCS motor subscore is 6.  Psychiatric:        Speech: Speech normal.        Behavior: Behavior normal. Behavior is cooperative.       Assessment and Plan:   Sergio was seen today for establish care, depression and anxiety.  Diagnoses and all orders for this visit:  Mild recurrent major depression ([place]) Long discussion today regarding this.  She does have some upcoming changes including end of her semester, and her roommate moving in to help pay for expenses, hopefully ending her second job.  For now we decided to continue Wellbutrin, however we are considering switching to Trintellix.  This would include going from her Wellbutrin 150 mg twice daily, to 150 mg  daily with the addition of Trintellix 5 mg daily.  We would then wean her Wellbutrin with time.  I also recommended that she consider [place].  We will put in referral for Dr. Lulu Riding if she is agreeable to this. I discussed with patient that if they develop any SI, to tell someone immediately and seek medical attention. She is interested in changing therapist, we will have Trey Paula reach out to her. -     Comprehensive metabolic panel -     Cancel: CBC With Differential -     CBC with Differential/Platelet; Future -     CBC with Differential/Platelet  Vitamin D deficiency -     VITAMIN D 25 Hydroxy (Vit-D Deficiency, Fractures)  . Reviewed expectations re: course of current medical issues. . Discussed self-management of symptoms. . Outlined signs and symptoms indicating need for more acute intervention. . Patient verbalized understanding and all questions were answered. . See orders for this visit as documented in the electronic medical record. . Patient received an After-Visit Summary.  CMA or LPN served as scribe during this visit. History, Physical, and Plan performed by medical provider. The above documentation has been reviewed and is accurate and complete.   Inda Coke, PA-C

## 2019-02-11 NOTE — Patient Instructions (Signed)
It was great to meet you!  I will be in touch with labs!  When I send your lab results, I plan to send you the plan for switching to Trintellix, if you are interested in this.  Also, research Transcranial Magnetic Stimulation -- I like to do this via Dr. Zadie Rhine office here in [place]. Let me know your thoughts.  [email or web address]  I will have Trey Paula call you for an appointment for therapy.  Please follow-up after school has calmed down and roommate has moved and we can discuss possible medication switch.  Please tell someone you trust about how you feel and if you develop any suicidal thoughts, please go to the ER.  Take care,  Inda Coke PA-C

## 2019-02-16 ENCOUNTER — Encounter: Payer: Self-pay | Admitting: Physician Assistant

## 2019-02-17 ENCOUNTER — Other Ambulatory Visit: Payer: Self-pay | Admitting: Physician Assistant

## 2019-02-17 MED ORDER — VITAMIN D (ERGOCALCIFEROL) 1.25 MG (50000 UNIT) PO CAPS: 50000.0000 [IU] | ORAL_CAPSULE | ORAL | 0 refills | Status: DC

## 2019-02-27 ENCOUNTER — Ambulatory Visit (INDEPENDENT_AMBULATORY_CARE_PROVIDER_SITE_OTHER): Payer: BC Managed Care – PPO | Admitting: Physician Assistant

## 2019-02-27 ENCOUNTER — Encounter: Payer: Self-pay | Admitting: Physician Assistant

## 2019-02-27 DIAGNOSIS — F330 Major depressive disorder, recurrent, mild: Secondary | ICD-10-CM | POA: Diagnosis not present

## 2019-02-27 NOTE — Telephone Encounter (Signed)
Left message on voicemail to call office.  

## 2019-02-27 NOTE — Progress Notes (Signed)
TELEPHONE ENCOUNTER   Patient verbally agreed to telephone visit and is aware that copayment and coinsurance may apply. Patient was treated using telemedicine according to accepted telemedicine protocols.  Location of the patient: Home Location of provider: [place] [address] [employer] of all persons participating in the telemedicine service and role in the encounter: Jarold Motto, [place] , Elliot Cousin  Subjective:   Chief Complaint  Patient presents with  . Depression     HPI   Patient was last seen by me on October 7 for major depression.  She was on Wellbutrin immediate release 150 mg twice daily and tolerating well.  She reached out to [place] because she feels like she needs to make a change in her medication.  She feels "very apathetic and having intense mood swings".  She has withdrawn from her classes and is having difficulty concentrating.  She was able to schedule an appointment with Colen Darling towards the end of November however she feels like she needs a change before that time.  She denies suicidal or homicidal ideation at this time.  She has tried numerous medications in the past, see October 7 visit for more information.  She would like to also consider seeing a psychiatrist depending on how her symptoms go with the change in medication.  Patient Active Problem List   Diagnosis Date Noted  . Anxiety state 12/02/2018  . Mild recurrent major depression (HCC) 10/06/2018  . Herpetic whitlow 12/26/2017  . LGSIL on Pap smear of cervix 10/14/2017  . Cervical high risk human papillomavirus (HPV) DNA test positive 08/05/2017   Social History   Tobacco Use  . Smoking status: Never Smoker  . Smokeless tobacco: Never Used  Substance Use Topics  . Alcohol use: Yes    Alcohol/week: 2.0 standard drinks    Types: 2 Glasses of wine per week    Current Outpatient Medications:  .  ASHWAGANDHA PO, Take 1 tablet by mouth daily. , Disp: , Rfl:  .  buPROPion (WELLBUTRIN SR) 150 MG  12 hr tablet, Take 1 tablet (150 mg total) by mouth 2 (two) times daily., Disp: 90 tablet, Rfl: 1 .  Cholecalciferol (VITAMIN D3) 1000 units CAPS, Take by mouth., Disp: , Rfl:  .  Lactobacillus-Inulin (CULTURELLE DIGESTIVE HEALTH PO), Take 1 capsule by mouth daily. , Disp: , Rfl:  .  levonorgestrel (MIRENA, 52 MG,) 20 MCG/24HR IUD, 1 Intra Uterine Device (1 each total) by Intrauterine route once for 1 dose. (Patient taking differently: 1 each by Intrauterine route once. Inserted 08/12/2017 by GYN, needs to be removed 08/2022.), Disp: 1 Intra Uterine Device, Rfl: 0 .  Vitamin D, Ergocalciferol, (DRISDOL) 1.25 MG (50000 UT) CAPS capsule, Take 1 capsule (50,000 Units total) by mouth every 7 (seven) days., Disp: 8 capsule, Rfl: 0 No Known Allergies  Assessment & Plan:   1. Mild recurrent major depression (HCC)    Patient is currently without any suicidal or homicidal thoughts.  We are going to decrease her 150 mg Wellbutrin immediate release regimen to daily and start Trintellix 5 mg daily.  I have asked her to reach out to me in about a week or 2 into this regimen to see if it is effective for her. Psych referral started today. Follow-up virtual or in office visit in 1 month, or may defer care to psych if able to secure psychiatry appointment in the interim.  If she has any worsening thoughts in the meantime, she was instructed to go to the emergency  room.    *Patient was instructed to come to the office on Monday, October 26 to pick up 2 sample boxes Trintellix 5 mg.  Orders Placed This Encounter  Procedures  . Ambulatory referral to Psychiatry   No orders of the defined types were placed in this encounter.   Inda Coke, [place] 02/27/2019  Time spent with the patient: 6 minutes, spent in obtaining information about her symptoms, reviewing her previous labs, evaluations, and treatments, counseling her about her condition (please see the discussed topics above), and developing a plan to further  investigate it; she had a number of questions which I addressed.

## 2019-03-13 ENCOUNTER — Encounter: Payer: Self-pay | Admitting: Physician Assistant

## 2019-03-13 ENCOUNTER — Other Ambulatory Visit: Payer: Self-pay

## 2019-03-13 DIAGNOSIS — Z20822 Contact with and (suspected) exposure to covid-19: Secondary | ICD-10-CM

## 2019-03-13 MED ORDER — VORTIOXETINE HBR 5 MG PO TABS: 5.0000 mg | ORAL_TABLET | Freq: Every day | ORAL | 2 refills | Status: DC

## 2019-03-14 LAB — NOVEL CORONAVIRUS, NAA: SARS-CoV-2, NAA: NOT DETECTED

## 2019-03-20 ENCOUNTER — Telehealth: Payer: Self-pay | Admitting: *Deleted

## 2019-03-20 NOTE — Telephone Encounter (Signed)
Last entry 03/19/2019 Mallissa Desrochers Key: [id number] help? Call us at [phone] Outcome Approvedon November 12 Effective from 03/19/2019 through 03/17/2022. Drug Trintellix (formerly Brintellix) 5MG  tablets Form Blue Building control surveyor Form (CB)  PA was approved and pharmacy notified.

## 2019-03-24 ENCOUNTER — Other Ambulatory Visit: Payer: Self-pay

## 2019-03-24 DIAGNOSIS — Z20822 Contact with and (suspected) exposure to covid-19: Secondary | ICD-10-CM

## 2019-03-26 LAB — NOVEL CORONAVIRUS, NAA: SARS-CoV-2, NAA: NOT DETECTED

## 2019-03-30 ENCOUNTER — Ambulatory Visit: Payer: BC Managed Care – PPO | Admitting: Psychology

## 2019-04-01 DIAGNOSIS — Z23 Encounter for immunization: Secondary | ICD-10-CM | POA: Diagnosis not present

## 2019-04-01 DIAGNOSIS — R87611 Atypical squamous cells cannot exclude high grade squamous intraepithelial lesion on cytologic smear of cervix (ASC-H): Secondary | ICD-10-CM | POA: Diagnosis not present

## 2019-04-01 DIAGNOSIS — B373 Candidiasis of vulva and vagina: Secondary | ICD-10-CM | POA: Diagnosis not present

## 2019-04-01 DIAGNOSIS — Z1151 Encounter for screening for human papillomavirus (HPV): Secondary | ICD-10-CM | POA: Diagnosis not present

## 2019-04-01 DIAGNOSIS — Z124 Encounter for screening for malignant neoplasm of cervix: Secondary | ICD-10-CM | POA: Diagnosis not present

## 2019-04-06 ENCOUNTER — Other Ambulatory Visit: Payer: Self-pay

## 2019-04-06 DIAGNOSIS — Z20822 Contact with and (suspected) exposure to covid-19: Secondary | ICD-10-CM

## 2019-04-07 LAB — NOVEL CORONAVIRUS, NAA: SARS-CoV-2, NAA: NOT DETECTED

## 2019-04-13 ENCOUNTER — Other Ambulatory Visit: Payer: Self-pay | Admitting: Registered Nurse

## 2019-04-13 DIAGNOSIS — F411 Generalized anxiety disorder: Secondary | ICD-10-CM

## 2019-04-13 NOTE — Telephone Encounter (Signed)
Requested medication (s) are due for refill today yes  Requested medication (s) are on the active medication list yes  Future visit scheduled no  Last refill: 01/30/19  Notes to clinic: Patient has changed PCP and the medication has been adjusted since last filled- please review for current Rx from current PCP  Requested Prescriptions  Pending Prescriptions Disp Refills   buPROPion (WELLBUTRIN SR) 150 MG 12 hr tablet [Pharmacy Med Name: buPROPion HCL SR 150 MG TABLET] 90 tablet 0    Sig: TAKE ONE TABLET BY MOUTH TWICE A DAY     Psychiatry: Antidepressants - bupropion Passed - 04/13/2019 11:27 AM      Passed - Last BP in normal range    BP Readings from Last 1 Encounters:  02/11/19 110/78         Passed - Valid encounter within last 6 months    Recent Outpatient Visits          1 month ago Mild recurrent major depression ([place])   [place] [facility], [place], [place]   2 months ago Mild recurrent major depression [facility])   [place] [facility], [place], [place]   3 months ago    [facility], Delfino Lovett, NP   4 months ago Anxiety state   [facility], Delfino Lovett, NP   6 months ago Encounter to establish care   [facility], Delfino Lovett, NP             Passed - Completed PHQ-2 or PHQ-9 in the last 360 days.         Requested Prescriptions  Pending Prescriptions Disp Refills   buPROPion (WELLBUTRIN SR) 150 MG 12 hr tablet [Pharmacy Med Name: buPROPion HCL SR 150 MG TABLET] 90 tablet 0    Sig: TAKE ONE TABLET BY MOUTH TWICE A DAY     Psychiatry: Antidepressants - bupropion Passed - 04/13/2019 11:27 AM      Passed - Last BP in normal range    BP Readings from Last 1 Encounters:  02/11/19 110/78         Passed - Valid encounter within last 6 months    Recent Outpatient Visits          1 month ago Mild recurrent major depression [facility])   [place] [facility], [place],  [place]   2 months ago Mild recurrent major depression [facility])   [place] [facility], [place], [place]   3 months ago    [facility], Delfino Lovett, NP   4 months ago Anxiety state   [facility], Delfino Lovett, NP   6 months ago Encounter to establish care   [facility], Delfino Lovett, NP             Passed - Completed PHQ-2 or PHQ-9 in the last 360 days.

## 2019-04-14 ENCOUNTER — Encounter: Payer: Self-pay | Admitting: Physician Assistant

## 2019-04-14 ENCOUNTER — Ambulatory Visit (INDEPENDENT_AMBULATORY_CARE_PROVIDER_SITE_OTHER): Payer: BC Managed Care – PPO | Admitting: Psychology

## 2019-04-14 DIAGNOSIS — F411 Generalized anxiety disorder: Secondary | ICD-10-CM

## 2019-04-14 NOTE — Telephone Encounter (Signed)
Please call patient and see what dosage she is actually taking? We were transitioning her to Trintellix and I thought she was taking Wellbutrin 150 mg daily. Please confirm.

## 2019-04-14 NOTE — Telephone Encounter (Signed)
Not given by you in the past ok to fill?

## 2019-04-15 MED ORDER — BUPROPION HCL ER (SR) 150 MG PO TB12: 150.0000 mg | ORAL_TABLET | Freq: Two times a day (BID) | ORAL | 0 refills | Status: DC

## 2019-04-15 NOTE — Telephone Encounter (Signed)
Left message on voicemail to call office.  

## 2019-04-17 ENCOUNTER — Ambulatory Visit (INDEPENDENT_AMBULATORY_CARE_PROVIDER_SITE_OTHER): Payer: BC Managed Care – PPO | Admitting: Physician Assistant

## 2019-04-17 ENCOUNTER — Encounter: Payer: Self-pay | Admitting: Physician Assistant

## 2019-04-17 DIAGNOSIS — F411 Generalized anxiety disorder: Secondary | ICD-10-CM

## 2019-04-17 DIAGNOSIS — F321 Major depressive disorder, single episode, moderate: Secondary | ICD-10-CM | POA: Diagnosis not present

## 2019-04-17 MED ORDER — BUPROPION HCL ER (XL) 150 MG PO TB24: ORAL_TABLET | ORAL | 1 refills | Status: DC

## 2019-04-17 NOTE — Progress Notes (Signed)
Virtual Visit via Video   I connected with Briana Reed on 04/17/19 at  3:40 PM EST by a video enabled telemedicine application and verified that I am speaking with the correct person using two identifiers. Location patient: Home Location provider: [facility], Office Persons participating in the virtual visit: [place], [place] PA-C, Briana Mull, LPN   I discussed the limitations of evaluation and management by telemedicine and the availability of in person appointments. The patient expressed understanding and agreed to proceed.  Subjective:   HPI:   Patient last seen by me on February 27, 2019.  We changed her from 150 mg immediate release Wellbutrin twice daily to Wellbutrin 150 mg immediate release daily plus Trintellix 5 mg daily. She was doing well on this regimen but ran out and her insurance didn't approve the medication for an affordable amount.  She is now on 150 mg Wellbutrin immediate release daily. She started seeing Briana Reed this week. She did end up unenrolling from her classes 2/2 inability to concentrate and focus. Denies SI/HI. Working part time, 4 days per week.  Depression screen [facility] 2/9 04/17/2019 02/11/2019 01/02/2019 12/02/2018 10/06/2018  Decreased Interest 3 1 0 3 0  Down, Depressed, Hopeless 3 1 0 3 0  PHQ - 2 Score 6 2 0 6 0  Altered sleeping 3 2 - 0 -  Tired, decreased energy 3 3 - 1 -  Change in appetite 3 2 - 0 -  Feeling bad or failure about yourself  2 0 - 1 -  Trouble concentrating 3 3 - 3 -  Moving slowly or fidgety/restless 0 0 - 0 -  Suicidal thoughts 0 0 - 0 -  PHQ-9 Score 20 12 - 11 -  Difficult doing work/chores Very difficult Very difficult - - -  Some recent data might be hidden       Office Visit from 04/17/2019 in [facility]  PHQ-9 Total Score  20         ROS: See pertinent positives and negatives per HPI.  Patient Active Problem List   Diagnosis Date Noted  . Anxiety state  12/02/2018  . Mild recurrent major depression (HCC) 10/06/2018  . Herpetic whitlow 12/26/2017  . LGSIL on Pap smear of cervix 10/14/2017  . Cervical high risk human papillomavirus (HPV) DNA test positive 08/05/2017    Social History   Tobacco Use  . Smoking status: Never Smoker  . Smokeless tobacco: Never Used  Substance Use Topics  . Alcohol use: Yes    Alcohol/week: 2.0 standard drinks    Types: 2 Glasses of wine per week    Current Outpatient Medications:  .  ASHWAGANDHA PO, Take 1 tablet by mouth daily. , Disp: , Rfl:  .  Lactobacillus-Inulin (CULTURELLE DIGESTIVE HEALTH PO), Take 1 capsule by mouth daily. , Disp: , Rfl:  .  Vitamin D, Ergocalciferol, (DRISDOL) 1.25 MG (50000 UT) CAPS capsule, Take 1 capsule (50,000 Units total) by mouth every 7 (seven) days., Disp: 8 capsule, Rfl: 0 .  buPROPion (WELLBUTRIN XL) 150 MG 24 hr tablet, Take 1 tablet daily q am, Disp: 30 tablet, Rfl: 1 .  levonorgestrel (MIRENA, 52 MG,) 20 MCG/24HR IUD, 1 Intra Uterine Device (1 each total) by Intrauterine route once for 1 dose. (Patient taking differently: 1 each by Intrauterine route once. Inserted 08/12/2017 by GYN, needs to be removed 08/2022.), Disp: 1 Intra Uterine Device, Rfl: 0  No Known Allergies  Objective:   VITALS:  Per patient if applicable, see vitals. GENERAL: Alert, appears well and in no acute distress. HEENT: Atraumatic, conjunctiva clear, no obvious abnormalities on inspection of external nose and ears. NECK: Normal movements of the head and neck. CARDIOPULMONARY: No increased WOB. Speaking in clear sentences. I:E ratio WNL.  MS: Moves all visible extremities without noticeable abnormality. PSYCH: Pleasant and cooperative, well-groomed. Speech normal rate and rhythm. Affect is appropriate. Insight and judgement are appropriate. Attention is focused, linear, and appropriate.  NEURO: CN grossly intact. Oriented as arrived to appointment on time with no prompting. Moves both UE equally.   SKIN: No obvious lesions, wounds, erythema, or cyanosis noted on face or hands.  Assessment and Plan:   Briana Reed was seen today for depression.  Diagnoses and all orders for this visit:  Mild recurrent major depression ([place]) Uncontrolled. Will trial Wellbutrin 150 mg extended release daily, as she is currently taking immediate release daily. She would like to retrial Trintellix 5 mg daily next month when she has new insurance. I have set a reminder for [place] to do this after Jan 1. At that time, we will trial Wellbutrin immediate release 150 mg in AM and 5 mg Trintellix in PM. I discussed with patient that if they develop any SI, to tell someone immediately and seek medical attention.  Other orders -     buPROPion (WELLBUTRIN XL) 150 MG 24 hr tablet; Take 1 tablet daily q am  . Reviewed expectations re: course of current medical issues. . Discussed self-management of symptoms. . Outlined signs and symptoms indicating need for more acute intervention. . Patient verbalized understanding and all questions were answered. Marland Kitchen Health Maintenance issues including appropriate healthy diet, exercise, and smoking avoidance were discussed with patient. . See orders for this visit as documented in the electronic medical record.  I discussed the assessment and treatment plan with the patient. The patient was provided an opportunity to ask questions and all were answered. The patient agreed with the plan and demonstrated an understanding of the instructions.   The patient was advised to call back or seek an in-person evaluation if the symptoms worsen or if the condition fails to improve as anticipated.   CMA or LPN served as scribe during this visit. History, Physical, and Plan performed by medical provider. The above documentation has been reviewed and is accurate and complete.   [place], [place] 04/17/2019

## 2019-04-28 ENCOUNTER — Ambulatory Visit (INDEPENDENT_AMBULATORY_CARE_PROVIDER_SITE_OTHER): Payer: BC Managed Care – PPO | Admitting: Psychology

## 2019-04-28 DIAGNOSIS — F411 Generalized anxiety disorder: Secondary | ICD-10-CM

## 2019-05-11 ENCOUNTER — Other Ambulatory Visit: Payer: Self-pay | Admitting: *Deleted

## 2019-05-11 ENCOUNTER — Encounter: Payer: Self-pay | Admitting: *Deleted

## 2019-05-11 MED ORDER — VORTIOXETINE HBR 5 MG PO TABS: 5.0000 mg | ORAL_TABLET | Freq: Every day | ORAL | 2 refills | Status: DC

## 2019-05-13 ENCOUNTER — Encounter: Payer: Self-pay | Admitting: Physician Assistant

## 2019-05-14 ENCOUNTER — Other Ambulatory Visit: Payer: Self-pay | Admitting: Physician Assistant

## 2019-05-14 MED ORDER — VORTIOXETINE HBR 5 MG PO TABS: 5.0000 mg | ORAL_TABLET | Freq: Every day | ORAL | 2 refills | Status: DC

## 2019-05-15 ENCOUNTER — Encounter: Payer: Self-pay | Admitting: *Deleted

## 2019-05-15 ENCOUNTER — Ambulatory Visit (INDEPENDENT_AMBULATORY_CARE_PROVIDER_SITE_OTHER): Payer: 59 | Admitting: Psychology

## 2019-05-15 DIAGNOSIS — F411 Generalized anxiety disorder: Secondary | ICD-10-CM

## 2019-05-15 NOTE — Telephone Encounter (Signed)
Patient's insurance has been updated.

## 2019-05-18 ENCOUNTER — Ambulatory Visit: Payer: BC Managed Care – PPO | Admitting: Physician Assistant

## 2019-05-18 NOTE — Telephone Encounter (Signed)
Called pharmacy and spoke to Rainelle told her PA done for Trintellix 5 mg and has been approved. Khadejah verbalized understanding.

## 2019-05-18 NOTE — Telephone Encounter (Signed)
Received fax from pharmacy PA needs to be done for Trintellix 5 mg  Key: [id number] - PA Case ID: [id number] - Rx #: [id number] PA submitted. Approvedtoday PA Case: [id number], Status: Approved, Coverage Starts on: 05/18/2019 12:00:00 AM, Coverage Ends on: 05/17/2020 12:00:00 AM.

## 2019-05-26 ENCOUNTER — Ambulatory Visit: Payer: BC Managed Care – PPO | Admitting: Psychology

## 2019-06-02 ENCOUNTER — Ambulatory Visit (INDEPENDENT_AMBULATORY_CARE_PROVIDER_SITE_OTHER): Payer: 59 | Admitting: Psychology

## 2019-06-02 DIAGNOSIS — F411 Generalized anxiety disorder: Secondary | ICD-10-CM | POA: Diagnosis not present

## 2019-06-08 ENCOUNTER — Ambulatory Visit: Payer: 59 | Attending: Internal Medicine

## 2019-06-08 ENCOUNTER — Other Ambulatory Visit: Payer: 59

## 2019-06-08 DIAGNOSIS — Z20822 Contact with and (suspected) exposure to covid-19: Secondary | ICD-10-CM

## 2019-06-09 LAB — NOVEL CORONAVIRUS, NAA: SARS-CoV-2, NAA: NOT DETECTED

## 2019-06-12 ENCOUNTER — Other Ambulatory Visit: Payer: Self-pay | Admitting: Physician Assistant

## 2019-07-07 ENCOUNTER — Ambulatory Visit (INDEPENDENT_AMBULATORY_CARE_PROVIDER_SITE_OTHER): Payer: 59 | Admitting: Psychology

## 2019-07-07 DIAGNOSIS — F411 Generalized anxiety disorder: Secondary | ICD-10-CM | POA: Diagnosis not present

## 2019-07-28 ENCOUNTER — Ambulatory Visit: Payer: 59 | Admitting: Psychology

## 2019-08-13 ENCOUNTER — Other Ambulatory Visit: Payer: Self-pay | Admitting: Physician Assistant

## 2019-08-14 ENCOUNTER — Other Ambulatory Visit: Payer: Self-pay | Admitting: Physician Assistant

## 2019-08-19 ENCOUNTER — Ambulatory Visit: Payer: 59 | Admitting: Psychology

## 2019-09-16 ENCOUNTER — Ambulatory Visit: Payer: 59 | Admitting: Physician Assistant

## 2019-09-21 ENCOUNTER — Encounter: Payer: Self-pay | Admitting: Physician Assistant

## 2019-09-21 ENCOUNTER — Other Ambulatory Visit: Payer: Self-pay

## 2019-09-21 ENCOUNTER — Ambulatory Visit (INDEPENDENT_AMBULATORY_CARE_PROVIDER_SITE_OTHER): Payer: 59 | Admitting: Physician Assistant

## 2019-09-21 VITALS — BP 120/80 | HR 85 | Temp 97.9°F | Ht 64.0 in | Wt 173.5 lb

## 2019-09-21 DIAGNOSIS — F321 Major depressive disorder, single episode, moderate: Secondary | ICD-10-CM | POA: Diagnosis not present

## 2019-09-21 DIAGNOSIS — R194 Change in bowel habit: Secondary | ICD-10-CM | POA: Diagnosis not present

## 2019-09-21 DIAGNOSIS — F411 Generalized anxiety disorder: Secondary | ICD-10-CM

## 2019-09-21 NOTE — Patient Instructions (Signed)
It was great to see you!  Stop Wellbutrin. Increase Trintellix to 10 mg daily.  If you want to proceed with this dosage, let me know so we can fax in the script to the patient assistance program.  Let's follow-up in 1-2 months, sooner if you have concerns.  Take care,  Jarold Motto PA-C

## 2019-09-21 NOTE — Progress Notes (Signed)
Briana Reed is a 34 y.o. female is here to discuss: IBS  I acted as a Education administrator for [employer], PA-C Anselmo Pickler, LPN   History of Present Illness:   Chief Complaint  Patient presents with  . Irritable Bowel Syndrome  . Depression    HPI   Irritable Bowel Syndrome Pt said since September she has been having diarrhea and watery stools 4-6 times a day, with cramps and bloating, fatigue and headaches. Has started a probiotic in April and is only going two times a day now. She used to have a significant history of constipation, as well as bloating. Symptoms related to anxiety in the past. Has tried to change her diet without significant relief of symptoms. In the past thought she may have a lactose intolerance. Has not tried a gluten-free diet.  No rectal bleeding, family history of colon cancer.  Wt Readings from Last 4 Encounters:  09/21/19 173 lb 8 oz (78.7 kg)  02/11/19 168 lb (76.2 kg)  10/06/18 180 lb (81.6 kg)  02/25/18 183 lb (83 kg)    Anxiety/Depression Pt following up, currently taking Wellbutrin XR 150 mg daily and Trintellix 5 mg daily. Pt said her depression is better. She is wanting to get out of bed more days then not. Pt states still very anxious and having some nausea. Pt not having crying spells anymore. Denies current SI/HI.  GAD 7 : Generalized Anxiety Score 09/21/2019 02/11/2019 02/11/2018  Nervous, Anxious, on Edge 3 3 1   Control/stop worrying 2 3 0  Worry too much - different things 3 3 1   Trouble relaxing 3 3 2   Restless 3 2 0  Easily annoyed or irritable 3 2 0  Afraid - awful might happen 1 1 0  Total GAD 7 Score 18 17 4   Anxiety Difficulty Very difficult Very difficult Not difficult at all    Depression screen [facility] 2/9 09/21/2019 04/17/2019 02/11/2019 01/02/2019 12/02/2018  Decreased Interest 2 3 1  0 3  Down, Depressed, Hopeless 1 3 1  0 3  PHQ - 2 Score 3 6 2  0 6  Altered sleeping 2 3 2  - 0  Tired, decreased energy 3 3 3  - 1  Change in  appetite 3 3 2  - 0  Feeling bad or failure about yourself  1 2 0 - 1  Trouble concentrating 1 3 3  - 3  Moving slowly or fidgety/restless 0 0 0 - 0  Suicidal thoughts 0 0 0 - 0  PHQ-9 Score 13 20 12  - 11  Difficult doing work/chores Very difficult Very difficult Very difficult - -  Some recent data might be hidden    Health Maintenance Due  Topic Date Due  . PAP SMEAR-Modifier  02/04/2018    Past Medical History:  Diagnosis Date  . Anemia   . Anxiety   . Depression   . GERD (gastroesophageal reflux disease)   . History of Papanicolaou smear of cervix 10/28/12; 08/02/15   NEG; LGSIL, HPV  . Immunization, viral disease    GARDASIL COMPLETED  . Vitamin D deficiency 07/2011     Social History   Socioeconomic History  . Marital status: Single    Spouse name: Not on file  . Number of children: 0  . Years of education: 72  . Highest education level: Associate degree: occupational, Hotel manager, or vocational program  Occupational History  . Occupation: bartender     Comment: Clinical cytogeneticist   Tobacco Use  . Smoking status: Never Smoker  .  Smokeless tobacco: Never Used  Substance and Sexual Activity  . Alcohol use: Yes    Alcohol/week: 2.0 standard drinks    Types: 2 Glasses of wine per week  . Drug use: No  . Sexual activity: Yes    Partners: Male    Birth control/protection: I.U.D.  Other Topics Concern  . 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 Difficulty of Paying Living Expenses:   Food Insecurity:   . Worried About Programme researcher, broadcasting/film/video in the Last Year:   . Barista in the Last Year:   Transportation Needs:   . Freight forwarder (Medical):   Marland Kitchen Lack of Transportation  (Non-Medical):   Physical Activity:   . Days of Exercise per Week:   . Minutes of Exercise per Session:   Stress:   . Feeling of Stress :   Social Connections:   . Frequency of Communication with Friends and Family:   . Frequency of Social Gatherings with Friends and Family:   . Attends Religious Services:   . Active Member of Clubs or Organizations:   . Attends Banker Meetings:   Marland Kitchen Marital Status:   Intimate Partner Violence:   . Fear of Current or Ex-Partner:   . Emotionally Abused:   Marland Kitchen Physically Abused:   . Sexually Abused:     Past Surgical History:  Procedure Laterality Date  . ESOPHAGOGASTRODUODENOSCOPY  2008   WNL    Family History  Problem Relation Age of Onset  . Skin cancer Mother 48       MELANOMA  . Hypertension Mother   . Endometrial cancer Mother        on radiation treatments  . Uterine cancer Mother 73  . Hyperlipidemia Mother   . Lung cancer Maternal Grandmother 80  . Uterine cancer Maternal Grandmother 65  . Hypertension Father   . Stroke Father   . Hyperlipidemia Father   . Heart attack Maternal Grandfather   . Heart attack Paternal Grandmother   . Heart attack Paternal Grandfather     PMHx, SurgHx, SocialHx, FamHx, Medications, and Allergies were reviewed in the Visit Navigator and updated as appropriate.   Patient Active Problem List   Diagnosis Date Noted  . Anxiety state 12/02/2018  . Mild recurrent major depression (HCC) 10/06/2018  . Herpetic whitlow 12/26/2017  . LGSIL on Pap smear of cervix 10/14/2017  . Cervical high risk human papillomavirus (HPV) DNA test positive 08/05/2017    Social History   Tobacco Use  . Smoking status: Never Smoker  . Smokeless tobacco: Never Used  Substance Use Topics  . Alcohol use: Yes    Alcohol/week: 2.0 standard drinks    Types: 2 Glasses of wine per week  . Drug use: No    Current Medications and Allergies:    Current Outpatient Medications:  .  buPROPion (WELLBUTRIN XL)  150 MG 24 hr tablet, TAKE 1 TABLET BY MOUTH EVERY MORNING, Disp: 30 tablet, Rfl: 1 .  Probiotic Product (PROBIOTIC DAILY PO), Take 1 tablet by mouth daily., Disp: , Rfl:  .  TRINTELLIX 5 MG TABS tablet, TAKE 1 TABLET(5 MG) BY MOUTH DAILY,  Disp: 30 tablet, Rfl: 2 .  levonorgestrel (MIRENA, 52 MG,) 20 MCG/24HR IUD, 1 Intra Uterine Device (1 each total) by Intrauterine route once for 1 dose. (Patient taking differently: 1 each by Intrauterine route once. Inserted 08/12/2017 by GYN, needs to be removed 08/2022.), Disp: 1 Intra Uterine Device, Rfl: 0  No Known Allergies  Review of Systems   ROS Negative unless otherwise specified per HPI.  Vitals:   Vitals:   09/21/19 1443  BP: 120/80  Pulse: 85  Temp: 97.9 F (36.6 C)  TempSrc: Temporal  SpO2: 99%  Weight: 173 lb 8 oz (78.7 kg)  Height: 5\' 4"  (1.626 m)     Body mass index is 29.78 kg/m.   Physical Exam:    Physical Exam Vitals and nursing note reviewed.  Constitutional:      General: She is not in acute distress.    Appearance: She is well-developed. She is not ill-appearing or toxic-appearing.  Cardiovascular:     Rate and Rhythm: Normal rate and regular rhythm.     Pulses: Normal pulses.     Heart sounds: Normal heart sounds, S1 normal and S2 normal.     Comments: No LE edema Pulmonary:     Effort: Pulmonary effort is normal.     Breath sounds: Normal breath sounds.  Abdominal:     General: Abdomen is flat. Bowel sounds are normal.     Palpations: Abdomen is soft.     Tenderness: There is no abdominal tenderness.  Skin:    General: Skin is warm and dry.  Neurological:     Mental Status: She is alert.     GCS: GCS eye subscore is 4. GCS verbal subscore is 5. GCS motor subscore is 6.  Psychiatric:        Speech: Speech normal.        Behavior: Behavior normal. Behavior is cooperative.      Assessment and Plan:    Angelia was seen today for irritable bowel syndrome and depression.  Diagnoses and all orders for  this visit:  Bowel habit changes Uncontrolled. No red flags on discussion. I do suspect that her medication regimen is not helping her symptoms. Will trial discontinuing the Wellbutrin and increasing the Trintellix as she has had improvement of her mood with this regimen. I have also asked her to consider a gluten-free diet. Continue probiotic. Samples of IBGard provided in office. Follow-up in 1-2 months, sooner if concerns. If symptoms worsen/persist, will send to GI.  Moderate major depression (HCC); Anxiety state Improved. She states that the benefit in her mood that she has outweighs the nausea she is experiencing with the medication. Will discontinue Wellbutrin and increase Trintellix to 10 mg daily. Samples provided. Follow-up in 1-2 months. Patient assistance form completed for patient and I have asked her to let Irving Burton know if we need to fax rx for her. I discussed with patient that if they develop any SI, to tell someone immediately and seek medical attention.  . Reviewed expectations re: course of current medical issues. . Discussed self-management of symptoms. . Outlined signs and symptoms indicating need for more acute intervention. . Patient verbalized understanding and all questions were answered. . See orders for this visit as documented in the electronic medical record. . Patient received an After Visit Summary.  CMA or LPN served as scribe during this visit. History, Physical, and Plan performed by medical provider. The above documentation has been reviewed and is accurate and complete.  [place], PA-C  [place] [place] 09/21/2019  Follow-up: No follow-ups on file.

## 2019-10-11 ENCOUNTER — Other Ambulatory Visit: Payer: Self-pay | Admitting: Physician Assistant

## 2019-10-13 ENCOUNTER — Encounter: Payer: Self-pay | Admitting: Physician Assistant

## 2019-10-14 ENCOUNTER — Encounter: Payer: Self-pay | Admitting: Gastroenterology

## 2019-10-14 ENCOUNTER — Other Ambulatory Visit: Payer: Self-pay | Admitting: Physician Assistant

## 2019-10-14 DIAGNOSIS — F411 Generalized anxiety disorder: Secondary | ICD-10-CM

## 2019-10-14 DIAGNOSIS — F321 Major depressive disorder, single episode, moderate: Secondary | ICD-10-CM

## 2019-10-14 DIAGNOSIS — R194 Change in bowel habit: Secondary | ICD-10-CM

## 2019-11-17 ENCOUNTER — Ambulatory Visit: Payer: 59 | Admitting: Gastroenterology

## 2020-01-15 ENCOUNTER — Ambulatory Visit: Payer: 59 | Admitting: Gastroenterology

## 2020-02-24 ENCOUNTER — Encounter: Payer: Self-pay | Admitting: Adult Health

## 2020-02-24 ENCOUNTER — Other Ambulatory Visit: Payer: Self-pay

## 2020-02-24 ENCOUNTER — Ambulatory Visit (INDEPENDENT_AMBULATORY_CARE_PROVIDER_SITE_OTHER): Payer: 59 | Admitting: Adult Health

## 2020-02-24 VITALS — BP 118/75 | HR 80 | Ht 63.5 in | Wt 173.0 lb

## 2020-02-24 DIAGNOSIS — F331 Major depressive disorder, recurrent, moderate: Secondary | ICD-10-CM | POA: Diagnosis not present

## 2020-02-24 DIAGNOSIS — F411 Generalized anxiety disorder: Secondary | ICD-10-CM

## 2020-02-24 MED ORDER — BUPROPION HCL ER (XL) 150 MG PO TB24: ORAL_TABLET | ORAL | 2 refills | Status: DC

## 2020-02-24 NOTE — Progress Notes (Signed)
[facility] MD/PA/NP Initial Note  02/24/2020 3:54 PM Briana Reed  MRN:  [id number]  Chief Complaint:   HPI:   Describes mood today as "so-so". Tearful at times - "a few times a week". Mood symptoms - reports depression, anxiety, and irritability. Feels like she is always depressed. 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 Stating "I don't feel like myself". Doesn't have the motivation to do the things she wants. Has taken Wellbutrin and Trintellix in the past. Doesn't really care about things. Gets overwhelmed with day to day tasks. Feels like she's going to forget things - and does. Wants to take better care of herself. Doesn't want to be "spacey" and out of touch. Tired of feeling defeated. Denies overspending, a history of alcohol use, but only drinks a few glasses of wine twice a week currently. Decreased interest and motivation. Taking medications as prescribed.  Energy levels stable. Active, does not have a regular exercise routine.  Enjoys some usual interests and activities. Divorced - married 3 years. Single. Dating - has a boyfriend of 1 year. Lives alone 3 cats. Sister and mother in [place]. Father in [place]. Has friends. Appetite adequate. Weight stable - 173 pounds. Having some GI issues - has an appointment with Gastroenterology. Sleeps well most nights. Averages 6 to 8 hours. Focus and concentration difficulties. Very forgetful. Family members with ADHD. Dropped out of her classes this semester. Working on an associates in Ecologist. Completing tasks. Managing aspects of household. 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  Denies SI or HI.  Denies AH or VH. Seeing therapist - Estill Bakes.  Previous medication trials: Wellbutrin XL, Trintellix  Visit Diagnosis:    ICD-10-CM   1. Generalized anxiety disorder  F41.1   2. Major depressive disorder, recurrent episode, moderate (HCC)  F33.1       Past Psychiatric History: Admitted at age 3 for  suicidal thoughts. History of cutting.   Past Medical History:  Past Medical History:  Diagnosis Date   Anemia    Anxiety    Depression    GERD (gastroesophageal reflux disease)    History of Papanicolaou smear of cervix 10/28/12; 08/02/15   NEG; LGSIL, HPV   Immunization, viral disease    GARDASIL COMPLETED   Vitamin D deficiency 07/2011    Past Surgical History:  Procedure Laterality Date   ESOPHAGOGASTRODUODENOSCOPY  2008   WNL    Family Psychiatric History: Family with mental health issues.  Family History:  Family History  Problem Relation Age of Onset   Skin cancer Mother 38       MELANOMA   Hypertension Mother    Endometrial cancer Mother        on radiation treatments   Uterine cancer Mother 64   Hyperlipidemia Mother    Lung cancer Maternal Grandmother 35   Uterine cancer Maternal Grandmother 97   Hypertension Father    Stroke Father    Hyperlipidemia Father    Heart attack Maternal Grandfather    Heart attack Paternal Grandmother    Heart attack Paternal Grandfather     Social History:  Social History   Socioeconomic History   Marital status: Single    Spouse name: Not on file   Number of children: 0   Years of education: 14   Highest education level: Associate degree: occupational, Scientist, product/process development, or vocational program  Occupational History   Occupation: bartender     Comment: Tourist information centre manager   Tobacco Use  Smoking status: Never Smoker   Smokeless tobacco: Never Used  Vaping Use   Vaping Use: Never used  Substance and Sexual Activity   Alcohol use: Yes    Alcohol/week: 2.0 standard drinks    Types: 2 Glasses of wine per week   Drug use: No   Sexual activity: Yes    Partners: Male    Birth control/protection: I.U.D.  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, trade union    Worried about her mother, getting treatment for  endometrial cancer   She was married previously from 2013 till 2016 - divorced since 2017   Social Determinants of Health   Financial Resource Strain:    Difficulty of Paying Living Expenses: Not on file  Food Insecurity:    Worried About Programme researcher, broadcasting/film/video in the Last Year: Not on file   [employer] of Food in the Last Year: Not on file  Transportation Needs:    Lack of Transportation (Medical): Not on file   Lack of Transportation (Non-Medical): Not on file  Physical Activity:    Days of Exercise per Week: Not on file   Minutes of Exercise per Session: Not on file  Stress:    Feeling of Stress : Not on file  Social Connections:    Frequency of Communication with Friends and Family: Not on file   Frequency of Social Gatherings with Friends and Family: Not on file   Attends Religious Services: Not on file   Active Member of Clubs or Organizations: Not on file   Attends Banker Meetings: Not on file   Marital Status: Not on file    Allergies: No Known Allergies  Metabolic Disorder Labs: Lab Results  Component Value Date   HGBA1C 5.0 06/12/2017   MPG 97 06/12/2017   No results found for: PROLACTIN Lab Results  Component Value Date   CHOL 140 06/12/2017   TRIG 60 06/12/2017   HDL 71 06/12/2017   CHOLHDL 2.0 06/12/2017   LDLCALC 55 06/12/2017   Lab Results  Component Value Date   TSH 0.818 02/11/2018   TSH 2.08 06/12/2017    Therapeutic Level Labs: No results found for: LITHIUM No results found for: VALPROATE No components found for:  CBMZ  Current Medications: Current Outpatient Medications  Medication Sig Dispense Refill   buPROPion (WELLBUTRIN XL) 150 MG 24 hr tablet TAKE 1 TABLET BY MOUTH EVERY MORNING 30 tablet 1   levonorgestrel (MIRENA, 52 MG,) 20 MCG/24HR IUD 1 Intra Uterine Device (1 each total) by Intrauterine route once for 1 dose. (Patient taking differently: 1 each by Intrauterine route once. Inserted 08/12/2017 by GYN, needs  to be removed 08/2022.) 1 Intra Uterine Device 0   Probiotic Product (PROBIOTIC DAILY PO) Take 1 tablet by mouth daily.     TRINTELLIX 5 MG TABS tablet TAKE 1 TABLET(5 MG) BY MOUTH DAILY 30 tablet 2   No current facility-administered medications for this visit.    Medication Side Effects: none  Orders placed this visit:  No orders of the defined types were placed in this encounter.   Psychiatric Specialty Exam:  Review of Systems  Musculoskeletal: Negative for gait problem.  Neurological: Negative for tremors.  Psychiatric/Behavioral:       Please refer to HPI    Blood pressure 118/75, pulse 80, height 5' 3.5" (1.613 m), weight 173 lb (78.5 kg).Body  mass index is 30.16 kg/m.  General Appearance: Casual, Neat and Well Groomed  Eye Contact:  Good  Speech:  Clear and Coherent and Normal Rate  Volume:  Normal  Mood:  Anxious, Depressed and Irritable  Affect:  Appropriate and Congruent  Thought Process:  Coherent and Descriptions of Associations: Intact  Orientation:  Full (Time, Place, and Person)  Thought Content: Logical   Suicidal Thoughts:  No  Homicidal Thoughts:  No  Memory:  WNL  Judgement:  Good  Insight:  Good  Psychomotor Activity:  Normal  Concentration:  Concentration: Good  Recall:  Good  Fund of Knowledge: Good  Language: Good  Assets:  Communication Skills Desire for Improvement Financial Resources/Insurance Housing Intimacy Leisure Time Physical Health Resilience Social Support Talents/Skills Transportation Vocational/Educational  ADL's:  Intact  Cognition: WNL  Prognosis:  Good   Screenings:  AUDIT     Office Visit from 10/06/2018 in [facility] at [facility]  Alcohol Use Disorder Identification Test Final Score (AUDIT) 6    GAD-7     Office Visit from 09/21/2019 in [facility] from 02/11/2019 in [facility] Visit from 02/11/2018 in [facility] at [facility] [facility]  Total GAD-7 Score 18 17  4     PHQ2-9     Office Visit from 09/21/2019 in [facility] Visit from 04/17/2019 in [facility] Visit from 02/11/2019 in [facility] from 01/02/2019 in Primary Care at 01/04/2019 from 12/02/2018 in [facility] at [facility] Total Score 3 6 2  0 6  PHQ-9 Total Score 13 20 12  -- 11      Receiving Psychotherapy: No   Treatment Plan/Recommendations:   Plan:  PDMP reviewed  1. Add Wellbutrin XL 150mg  daily x 7 days, then two tabs daily.   Greater than 50% of face to face time with patient was spent on counseling and coordination of care. We discussed ADHD, dietary needs, and vitamin supplements.   Completed [facility] ADHD testing 47/58 - ADHD likely   Meets DSM diagnostic criteria for ADHD diagnosis  RTC 4 weeks  Patient advised to contact office with any questions, adverse effects, or acute worsening in signs and symptoms.       , NP

## 2020-03-18 ENCOUNTER — Ambulatory Visit: Payer: 59 | Admitting: Physician Assistant

## 2020-03-22 ENCOUNTER — Telehealth: Payer: Self-pay

## 2020-03-22 ENCOUNTER — Encounter: Payer: Self-pay | Admitting: Gastroenterology

## 2020-03-22 ENCOUNTER — Ambulatory Visit (INDEPENDENT_AMBULATORY_CARE_PROVIDER_SITE_OTHER): Payer: 59 | Admitting: Gastroenterology

## 2020-03-22 VITALS — BP 104/66 | HR 70 | Ht 64.0 in | Wt 174.0 lb

## 2020-03-22 DIAGNOSIS — R197 Diarrhea, unspecified: Secondary | ICD-10-CM

## 2020-03-22 NOTE — Progress Notes (Signed)
Referring Provider: Inda Coke, PA Primary Care Physician:  Inda Coke, PA  Reason for Consultation:  Diarrhea, bloating   IMPRESSION:  Altered bowel habits.  4-6 episodes of watery, urgent diarrhea daily. Associated bloating, early satiety, abdominal cramping and eructuation. Rare nausea. Some improvement on gluten free diet.   The differential diagnosis of chronic diarrhea without alarm features includes: irritable bowel syndrome, IBD, celiac disease,chronic infection (such as giardia), food intolerance, microscopic colitis, thyroid disorder, other functional GI disease. By history, this is less likely to be obstruction.  She does not feel she needs medical therapy but she would like a diagnosis and non-pharmaceutical management.    PLAN: - Fecal calprotectin, ESR, and CRP to screen for IBD - Giardia testing - TSH - IgA tissue transglutaminase, IgA level to test for celiac disease - Follow-up in 4 weeks or earlier as needed - Obtain records from Dr. Allen Norris  Please see the "Patient Instructions" section for addition details about the plan.  HPI: Briana Reed is a 34 y.o. female referred by PA Morene Rankins. History is obtained through the patient and review of her electronic health record.  She has a history of anemia, anxiety, depression, and reflux.  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  Evaluated by Dr. Allen Norris at age 79 for heartburn despite an empiric 2 week trial of OTC PPI. Had an EGD at that time. She remembers it being normal. Reports are not available in EPIC. Has since identified sugar and fried foods as her triggers and his able to manage her symptoms with diet choices.   August 2020 she developed 4-6 episodes of watery, urgent diarrhea daily. Associated bloating, early satiety, abdominal cramping and eructuation. Rare nausea.  No blood or mucus.  Not associated with recent use of antibiotics, new medications, sick contacts, travel,  well/stream water. Occurred around the time of a break-up.  Not triggered by eating.  No improvement with discontinuation of her antidepressants.  Reduced gluten in her diet in September and she finds that the diarrhea has largely improved.   Review of labs show a normal TSH 2019  Mom with precancerous polyps. No other known family history of colon cancer or polyps. No family history of uterine/endometrial cancer, pancreatic cancer or gastric/stomach cancer.   Past Medical History:  Diagnosis Date  . Anemia   . Anxiety   . Depression   . GERD (gastroesophageal reflux disease)   . History of Papanicolaou smear of cervix 10/28/12; 08/02/15   NEG; LGSIL, HPV  . Immunization, viral disease    GARDASIL COMPLETED  . Vitamin D deficiency 07/2011    Past Surgical History:  Procedure Laterality Date  . ESOPHAGOGASTRODUODENOSCOPY  2008   WNL    Current Outpatient Medications  Medication Sig Dispense Refill  . buPROPion (WELLBUTRIN XL) 150 MG 24 hr tablet TAKE 1 TABLET BY MOUTH EVERY MORNING (Patient taking differently: 300 mg. TAKE 1 TABLET BY MOUTH EVERY MORNING) 30 tablet 1  . levonorgestrel (MIRENA, 52 MG,) 20 MCG/24HR IUD 1 Intra Uterine Device (1 each total) by Intrauterine route once for 1 dose. (Patient taking differently: 1 each by Intrauterine route once. Inserted 08/12/2017 by GYN, needs to be removed 08/2022.) 1 Intra Uterine Device 0   No current facility-administered medications for this visit.    Allergies as of 03/22/2020  . (No Known Allergies)    Family History  Problem Relation Age of Onset  . Skin cancer Mother 31  MELANOMA  . Hypertension Mother   . Endometrial cancer Mother        on radiation treatments  . Uterine cancer Mother 34  . Hyperlipidemia Mother   . Lung cancer Maternal Grandmother 80  . Uterine cancer Maternal Grandmother 81  . Hypertension Father   . Stroke Father   . Hyperlipidemia Father   . Heart attack Maternal Grandfather   . Heart  attack Paternal Grandmother   . Heart attack Paternal Grandfather   . Stomach cancer Neg Hx   . Colon cancer Neg Hx   . Esophageal cancer Neg Hx   . Pancreatic cancer Neg Hx     Social History   Socioeconomic History  . Marital status: Single    Spouse name: Not on file  . Number of children: 0  . Years of education: 46  . Highest education level: Associate degree: occupational, Hotel manager, or vocational program  Occupational History  . Occupation: bartender     Comment: Clinical cytogeneticist   Tobacco Use  . Smoking status: Never Smoker  . Smokeless tobacco: Never Used  Vaping Use  . Vaping Use: Never used  Substance and Sexual Activity  . Alcohol use: Yes    Alcohol/week: 2.0 standard drinks    Types: 2 Glasses of wine per week    Comment: social   . Drug use: No  . Sexual activity: Yes    Partners: Male    Birth control/protection: I.U.D.  Other Topics Concern  . Not on file  Social History Narrative   Se works as a Chief Operating Officer and is going to school full time to become a Dietitian    Worried about her mother, getting treatment for endometrial cancer   She was married previously from 2013 till 2016 - divorced since 2017   Social Determinants of Health   Financial Resource Strain:   . Difficulty of Paying Living Expenses: Not on file  Food Insecurity:   . Worried About Charity fundraiser in the Last Year: Not on file  . Ran Out of Food in the Last Year: Not on file  Transportation Needs:   . Lack of Transportation (Medical): Not on file  . Lack of Transportation (Non-Medical): Not on file  Physical Activity:   . Days of Exercise per Week: Not on file  . Minutes of Exercise per Session: Not on file  Stress:   . Feeling of Stress : Not on file  Social Connections:   . Frequency of Communication with Friends and Family: Not on file  . Frequency of Social Gatherings with Friends and Family: Not on file  . Attends Religious Services: Not on file  . Active Member of  Clubs or Organizations: Not on file  . Attends Archivist Meetings: Not on file  . Marital Status: Not on file  Intimate Partner Violence:   . Fear of Current or Ex-Partner: Not on file  . Emotionally Abused: Not on file  . Physically Abused: Not on file  . Sexually Abused: Not on file    Review of Systems: 12 system ROS is negative except as noted above with the additions of anxiety, depression, fatigue, headaches.   Physical Exam: General:   Alert,  well-nourished, pleasant and cooperative in NAD Head:  Normocephalic and atraumatic. Eyes:  Sclera clear, no icterus.   Conjunctiva pink. Ears:  Normal auditory acuity. Nose:  No deformity, discharge,  or lesions. Mouth:  No deformity or lesions.   Neck:  Supple; no masses  or thyromegaly. Lungs:  Clear throughout to auscultation.   No wheezes. Heart:  Regular rate and rhythm; no murmurs. Abdomen:  Soft, nontender, nondistended, normal bowel sounds, no rebound or guarding. No hepatosplenomegaly.   Rectal:  Deferred  Msk:  Symmetrical. No boney deformities LAD: No inguinal or umbilical LAD Extremities:  No clubbing or edema. Neurologic:  Alert and  oriented x4;  grossly nonfocal Skin:  Intact without significant lesions or rashes. Psych:  Alert and cooperative. Normal mood and affect.     Studies/Results: No results found.    Linnie Mcglocklin L. Tarri Glenn, MD, MPH 03/22/2020, 2:33 PM

## 2020-03-22 NOTE — Telephone Encounter (Signed)
FAXED DOCUMENTS  Provider: Dr. Servando Snare  Document: Signed Medical Records Release  Above information has been faxed successfully to the Provider's office listed above. Documents and fax confirmation have been placed in the faxed file for future reference.

## 2020-03-22 NOTE — Patient Instructions (Addendum)
I have recommend some labs and stool studies, including testing for celiac.  LABS: Your provider has requested that you go to the basement level for lab work before leaving today. Press "B" on the elevator. The lab is located at the first door on the left as you exit the elevator.  HEALTHCARE LAWS AND MY CHART RESULTS: Due to recent changes in healthcare laws, you may see the results of your imaging and laboratory studies on MyChart before your provider has had a chance to review them.  We understand that in some cases there may be results that are confusing or concerning to you. Not all laboratory results come back in the same time frame and the provider may be waiting for multiple results in order to interpret others.  Please give [place] 48 hours in order for your provider to thoroughly review all the results before contacting the office for clarification of your results.   Foods other than gluten-containing products and dairy may cause diarrhea. I recommend avoiding carbonated beverage for 2 weeks followed by 2 weeks off of all artificial sweeteners. Other foods that can cause diarrhea including raw fruits and vegetables and high fat foods.   We will obtain your previous Gastroenterology records from Dr. Servando Snare.  Let's plan to follow-up in 4 weeks, or earlier if needed. Please call with any questions or concerns before that time.

## 2020-03-23 ENCOUNTER — Other Ambulatory Visit (INDEPENDENT_AMBULATORY_CARE_PROVIDER_SITE_OTHER): Payer: 59

## 2020-03-23 ENCOUNTER — Other Ambulatory Visit: Payer: Self-pay

## 2020-03-23 ENCOUNTER — Ambulatory Visit (INDEPENDENT_AMBULATORY_CARE_PROVIDER_SITE_OTHER): Payer: 59 | Admitting: Adult Health

## 2020-03-23 ENCOUNTER — Encounter: Payer: Self-pay | Admitting: Gastroenterology

## 2020-03-23 ENCOUNTER — Encounter: Payer: Self-pay | Admitting: Adult Health

## 2020-03-23 DIAGNOSIS — R197 Diarrhea, unspecified: Secondary | ICD-10-CM | POA: Diagnosis not present

## 2020-03-23 DIAGNOSIS — F411 Generalized anxiety disorder: Secondary | ICD-10-CM

## 2020-03-23 DIAGNOSIS — F331 Major depressive disorder, recurrent, moderate: Secondary | ICD-10-CM

## 2020-03-23 LAB — C-REACTIVE PROTEIN: CRP: <1 mg/dL (ref 0.5–20.0)

## 2020-03-23 LAB — SEDIMENTATION RATE: Sed Rate: 3 mm/hr (ref 0–20)

## 2020-03-23 LAB — TSH: TSH: 0.62 u[IU]/mL (ref 0.35–4.50)

## 2020-03-23 MED ORDER — BUPROPION HCL ER (XL) 300 MG PO TB24: 300.0000 mg | ORAL_TABLET | Freq: Every day | ORAL | 2 refills | Status: DC

## 2020-03-23 NOTE — Progress Notes (Signed)
Briana Reed [id number] 1985/07/15 34 y.o.  Subjective:   Patient ID:  Briana Reed is a 34 y.o. (DOB 30-Oct-1985) female.  Chief Complaint: No chief complaint on file.   HPI Briana Reed presents to the office today for follow-up of MDD of GAD.   Describes mood today as "improved". Tearful at times - "not as frequent". Mood symptoms - reports decreased depression, anxiety, and irritability. Still depressed, but it's better. Feels like addition of Wellbutrin has been helpful. Noticing small improvements. Able to get things done. Not feeling stressed. Getting things done without "stressing about it". Not getting overwhelmed - "just doing it". Still getting adjusted to the medications. Happy with the small changes Manageable. Doable.  Decreased interest and motivation. Taking medications as prescribed.  Energy levels stable. Active, does not have a regular exercise routine.  Enjoys some usual interests and activities. Single. Dating - has a boyfriend of 1 year. Lives alone 3 cats. Sister and mother in [place]. Father in [place]. Talking to friends. Recent trip to [place] to see a band.  Appetite adequate. Weight stable - 173 pounds. Having some GI issues -working with Laurette Schimke - getting labs today.  Sleeps well most nights. Averages 6 to 7 hours. Focus and concentration improved. Working on an associates in Ecologist. Completing tasks. Managing aspects of household. 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  Denies SI or HI Denies AH or VH. Seeing therapist - Estill Bakes.  Previous medication trials: Wellbutrin XL, Trintellix   AUDIT     Office Visit from 10/06/2018 in Primary Care at [facility]  Alcohol Use Disorder Identification Test Final Score (AUDIT) 6    GAD-7     Office Visit from 09/21/2019 in  [facility] from 02/11/2019 in [facility] Visit from 02/11/2018 in Primary Care at [place]   Total GAD-7 Score 18 17 4     PHQ2-9     Office Visit from 09/21/2019 in [facility] Visit from 04/17/2019 in [facility] from 02/11/2019 in [facility] from 01/02/2019 in Primary Care at 01/04/2019 from 12/02/2018 in [facility] Care at [facility] Total Score 3 6 2  0 6  PHQ-9 Total Score 13 20 12  -- 11       Review of Systems:  Review of Systems  Musculoskeletal: Negative for gait problem.  Neurological: Negative for tremors.  Psychiatric/Behavioral:       Please refer to HPI    Medications: I have reviewed the patient's current medications.  Current Outpatient Medications  Medication Sig Dispense Refill   buPROPion (WELLBUTRIN XL) 300 MG 24 hr tablet Take 1 tablet (300 mg total) by mouth daily. 30 tablet 2   levonorgestrel (MIRENA, 52 MG,) 20 MCG/24HR IUD 1 Intra Uterine Device (1 each total) by Intrauterine route once for 1 dose. (Patient taking differently: 1 each by Intrauterine route once. Inserted 08/12/2017 by GYN, needs to be removed 08/2022.) 1 Intra Uterine Device 0   No current facility-administered medications for this visit.    Medication Side Effects: None  Allergies: No Known Allergies  Past Medical History:  Diagnosis Date   Anemia    Anxiety    Depression    GERD (gastroesophageal reflux disease)    History of Papanicolaou smear of cervix 10/28/12; 08/02/15   NEG; LGSIL, HPV   Immunization, viral disease    GARDASIL COMPLETED  Vitamin D deficiency 07/2011    Family History  Problem Relation Age of Onset   Skin cancer Mother 6040       MELANOMA   Hypertension Mother    Endometrial cancer Mother        on radiation treatments   Uterine cancer Mother [id number]   Hyperlipidemia Mother    Lung cancer Maternal Grandmother [phone]   Uterine cancer Maternal Grandmother [id number]   Hypertension Father    Stroke Father    Hyperlipidemia Father     Heart attack Maternal Grandfather    Heart attack Paternal Grandmother    Heart attack Paternal Grandfather    Stomach cancer Neg Hx    Colon cancer Neg Hx    Esophageal cancer Neg Hx    Pancreatic cancer Neg Hx     Social History   Socioeconomic History   Marital status: Single    Spouse name: Not on file   Number of children: 0   Years of education: 14   Highest education level: Associate degree: occupational, Scientist, product/process developmenttechnical, or vocational program  Occupational History   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 and is going to school full time to become a Research officer, trade unionmortician    Worried about her mother, getting treatment for endometrial cancer   She was married previously from 2013 till 2016 - divorced since 2017   Social Determinants of Health   Financial Resource Strain:    Difficulty of Paying Living Expenses: Not on file  Food Insecurity:    Worried About Programme researcher, broadcasting/film/videounning Out of Food in the Last Year: Not on file   [employer] Out of Food in the Last Year: Not on file  Transportation Needs:    Lack of Transportation (Medical): Not on file   Lack of Transportation (Non-Medical): Not on file  Physical Activity:    Days of Exercise per Week: Not on file   Minutes of Exercise per Session: Not on file  Stress:    Feeling of Stress : Not on file  Social Connections:    Frequency of Communication with Friends and Family: Not on file   Frequency of Social Gatherings with Friends and Family: Not on file   Attends Religious  Services: Not on file   Active Member of Clubs or Organizations: Not on file   Attends BankerClub or Organization Meetings: Not on file   Marital Status: Not on file  Intimate Partner Violence:    Fear of Current or Ex-Partner: Not on file   Emotionally Abused: Not on file   Physically Abused: Not on file   Sexually Abused: Not on file    Past Medical History, Surgical history, Social history, and Family history were reviewed and updated as appropriate.   Please see review of systems for further details on the patient's review from today.   Objective:   Physical Exam:  There were no vitals taken for this visit.  Physical Exam Constitutional:      General: She is not in acute distress. Musculoskeletal:        General: No deformity.  Neurological:     Mental Status: She is alert and oriented to person, place, and time.     Coordination: Coordination normal.  Psychiatric:        Attention and Perception: Attention and perception normal. She does not perceive auditory or visual hallucinations.        Mood and Affect: Mood normal. Mood is not anxious or depressed. Affect is not labile, blunt, angry or inappropriate.        Speech: Speech normal.        Behavior: Behavior normal.        Thought Content: Thought content normal. Thought content is not paranoid or delusional. Thought content does not include homicidal or suicidal ideation. Thought content does not include homicidal or suicidal plan.        Cognition and Memory: Cognition and memory normal.        Judgment: Judgment normal.     Comments: Insight intact     Lab Review:     Component Value Date/Time   NA 138 02/11/2019 1418   NA 141 02/11/2018 1624   K 4.1 02/11/2019 1418   CL 105 02/11/2019 1418   CO2 24 02/11/2019 1418   GLUCOSE 95 02/11/2019 1418   BUN 9 02/11/2019 1418   BUN 8 02/11/2018 1624   CREATININE 0.62 02/11/2019 1418   CREATININE 0.65 06/12/2017 1010   CALCIUM 9.8 02/11/2019 1418   PROT 7.3  02/11/2019 1418   PROT 7.2 02/11/2018 1624   ALBUMIN 4.6 02/11/2019 1418   ALBUMIN 4.5 02/11/2018 1624   AST 11 02/11/2019 1418   ALT 8 02/11/2019 1418   ALKPHOS 52 02/11/2019 1418   BILITOT 0.5 02/11/2019 1418   BILITOT 0.2 02/11/2018 1624   GFRNONAA 121 02/11/2018 1624   GFRAA 140 02/11/2018 1624       Component Value Date/Time   WBC 7.1 02/11/2019 1433   RBC 4.11 02/11/2019 1433   HGB 13.4 02/11/2019 1433   HGB 13.3 02/11/2018 1624   HCT 39.7 02/11/2019 1433   HCT 39.1 02/11/2018 1624   PLT 319.0 02/11/2019 1433   PLT 336 02/11/2018 1624   MCV 96.6 02/11/2019 1433   MCV 96 02/11/2018 1624   MCH 32.6 02/11/2018 1624   MCH 32.1 06/12/2017 1010   MCHC 33.7 02/11/2019 1433   RDW 12.9 02/11/2019 1433   RDW 12.4 02/11/2018 1624   LYMPHSABS 1.5 02/11/2019 1433   LYMPHSABS 1.8 02/11/2018 1624   MONOABS 0.5 02/11/2019 1433   EOSABS 0.0 02/11/2019 1433   EOSABS 0.0 02/11/2018 1624   BASOSABS 0.0 02/11/2019 1433   BASOSABS 0.0 02/11/2018 1624    No results found for: POCLITH, LITHIUM   No results found for: PHENYTOIN, PHENOBARB, VALPROATE, CBMZ   .res Assessment: Plan:    Plan:  PDMP reviewed  1. Continue Wellbutrin XL 300mg  daily.  Completed [facility] ADHD testing 47/58 - ADHD likely   Meets DSM diagnostic criteria for ADHD diagnosis  RTC 4 weeks  Patient advised to contact office with any questions, adverse effects, or acute worsening in signs and symptoms.   Diagnoses and all orders for this visit:  Generalized anxiety disorder -     buPROPion (WELLBUTRIN XL) 300 MG 24 hr tablet; Take 1 tablet (300 mg total) by mouth daily.  Major depressive disorder, recurrent episode, moderate (HCC) -  buPROPion (WELLBUTRIN XL) 300 MG 24 hr tablet; Take 1 tablet (300 mg total) by mouth daily.     Please see After Visit Summary for patient specific instructions.  Future Appointments  Date Time Provider Department Center  04/27/2020  1:30 PM Tressia Danas, MD [facility]    No orders of the defined types were placed in this encounter.   -------------------------------

## 2020-03-24 ENCOUNTER — Other Ambulatory Visit: Payer: 59

## 2020-03-24 DIAGNOSIS — R197 Diarrhea, unspecified: Secondary | ICD-10-CM

## 2020-03-24 LAB — IGA: Immunoglobulin A: 371 mg/dL — ABNORMAL HIGH (ref 47–310)

## 2020-03-24 LAB — TISSUE TRANSGLUTAMINASE, IGA: (tTG) Ab, IgA: <1 U/mL

## 2020-03-28 ENCOUNTER — Telehealth: Payer: Self-pay

## 2020-03-28 NOTE — Telephone Encounter (Signed)
Received notification from Dr. Annabell Sabal office stating medical records could not be "opened" d/t being >17yrs old. Advised medical records release be forwarded to [facility] @ [phone]. Request has been forwarded as requested with confirmation received.

## 2020-03-30 LAB — GIARDIA AND CRYPTOSPORIDIUM ANTIGEN PANEL
MICRO NUMBER:: 11221106
RESULT:: NOT DETECTED
SPECIMEN QUALITY:: ADEQUATE
Specimen Quality:: ADEQUATE
micro Number:: 11221105

## 2020-03-30 LAB — CALPROTECTIN: Calprotectin: 95 mcg/g

## 2020-04-11 NOTE — Telephone Encounter (Signed)
FAXED DOCUMENTS - SECOND REQUEST  Facility: [facility]  Document: Signed ROI (FORWARDED AS INSTRUCTED PER DR. Annabell Sabal OFFICE)  Records requested: EGD and path reports  Above information has again been faxed successfully to the [facility] as listed above. Documents and fax confirmations have been placed in the faxed file for future reference.

## 2020-04-15 NOTE — Telephone Encounter (Signed)
Records received were given to Dr. Orvan Falconer for review. HIM notified that [facility] is missing from report. States they will fax. Will continue to await [facility].

## 2020-04-27 ENCOUNTER — Encounter: Payer: Self-pay | Admitting: Gastroenterology

## 2020-04-27 ENCOUNTER — Other Ambulatory Visit: Payer: 59

## 2020-04-27 ENCOUNTER — Ambulatory Visit (INDEPENDENT_AMBULATORY_CARE_PROVIDER_SITE_OTHER): Payer: 59 | Admitting: Gastroenterology

## 2020-04-27 DIAGNOSIS — R140 Abdominal distension (gaseous): Secondary | ICD-10-CM | POA: Diagnosis not present

## 2020-04-27 DIAGNOSIS — R109 Unspecified abdominal pain: Secondary | ICD-10-CM | POA: Diagnosis not present

## 2020-04-27 DIAGNOSIS — R197 Diarrhea, unspecified: Secondary | ICD-10-CM | POA: Diagnosis not present

## 2020-04-27 NOTE — Progress Notes (Signed)
Referring Provider: Inda Coke, PA Primary Care Physician:  Inda Coke, PA  Chief complaint:  Diarrhea, bloating   IMPRESSION:  Initially seen for diarrhea.  Although this is now improved on a gluten-free diet she has ongoing bloating.  Negative testing has included TTGA, IgA, TSH, CRP, ESR, Giardia, fecal calprotectin. Symptoms not improved on a low dairy, carbonation free diet.  Probiotics worsened her symptoms.  Differential includes H. pylori, SIBO, food intolerance including carbohydrates, functional dyspepsia, gastroparesis, functional bloating.  Less likely hepatobiliary disease or pancreatic insufficiency.  Must also consider ovarian etiologies.    PLAN: - H pylori stool antigen - Breath test for bacterial overgrowth - Trial of FDGard (samples provided) -She will review her symptoms at her upcoming annual gynecology appointment in January - She will call in 3-4 weeks with a symptom update, will proceed with abdominal ultrasound and trial of dicyclomine at that time if her symptoms have not improved -Obtain results from the [place] performed by Dr. Allen Norris in 2008 - Follow-up in 4-8 weeks, earlier as needed   Please see the "Patient Instructions" section for addition details about the plan.  HPI: Briana Reed is a 34 y.o. female who returns in scheduled follow-up.  Initially seen in consultation on 03/22/2020.  The interval history is obtained through the patient and review of her electronic health record.  She has a history of anemia, anxiety, depression, and reflux.  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  Evaluated by Dr. Allen Norris at age 22 for heartburn despite an empiric 2 week trial of OTC PPI. Since her last visit I reviewed records from Dr. Allen Norris. EGD with Bravo placement 03/05/2007 with Dr. Allen Norris. Zline at 37 cm.  Z-line was irregular and located 37 cm from the incisors.  Esophageal biopsies obtained. Exam otherwise negative.   Esophageal biopsies consistent with mild reflux. No intestinal metapasia.  Gastric biopsies were not obtained.  Results from the [facility] not available.  Has since identified sugar and fried foods as her triggers and his able to manage her symptoms with diet choices.   August 2020 she developed 4-6 episodes of watery, urgent diarrhea daily. Associated bloating, early satiety, abdominal cramping and eructuation. Rare nausea.  No blood or mucus.  Not associated with recent use of antibiotics, new medications, sick contacts, travel, well/stream water. Occurred around the time of a break-up.  Not triggered by eating.  No improvement with discontinuation of her antidepressants.   Reduced gluten in her diet in September and she finds that the diarrhea has largely improved.  Started Wellbutrin in October and thought this might also be helping with the diarrhea. She found a trial of probiotics worsened her bloating.   Review of labs show a normal TSH 2019 Labs recommended at the time of her consultation are normal including 03/23/2020: TTGA, IgA, TSH, CRP, ESR, Giardia, fecal calprotectin  Returns today reporting improvement in her diarrhea.  However, she has ongoing, daily generalized abdominal pain, distention, and bloating. By the end of the day the distension is so bad her clothes don't fit. Abdominal muscles are tired by the end of the day.   Some temporal association with stress. No association with eructation or stress. Not changed by eating or defecation. No change minimizing dairy or carbonated beverages. No mammalian meat triggers.   She has an upcoming appointment with GYN.   Past Medical History:  Diagnosis Date  . Anemia   . Anxiety   . Depression   .  GERD (gastroesophageal reflux disease)   . History of Papanicolaou smear of cervix 10/28/12; 08/02/15   NEG; LGSIL, HPV  . Immunization, viral disease    GARDASIL COMPLETED  . Vitamin D deficiency 07/2011    Past Surgical History:   Procedure Laterality Date  . ESOPHAGOGASTRODUODENOSCOPY  2008   WNL    Current Outpatient Medications  Medication Sig Dispense Refill  . ascorbic acid (VITAMIN C) 500 MG tablet Take 500 mg by mouth daily.    Marland Kitchen buPROPion (WELLBUTRIN XL) 300 MG 24 hr tablet Take 1 tablet (300 mg total) by mouth daily. 30 tablet 2  . Cholecalciferol (VITAMIN D-3) 5000 UNIT/ML LIQD Place 5,000 Int'l Units/L under the tongue daily.    Marland Kitchen levonorgestrel (MIRENA, 52 MG,) 20 MCG/24HR IUD 1 Intra Uterine Device (1 each total) by Intrauterine route once for 1 dose. (Patient taking differently: 1 each by Intrauterine route once. Inserted 08/12/2017 by GYN, needs to be removed 08/2022.) 1 Intra Uterine Device 0  . Zinc 30 MG CAPS Take 1 capsule by mouth daily.     No current facility-administered medications for this visit.    Allergies as of 04/27/2020  . (No Known Allergies)    Family History  Problem Relation Age of Onset  . Skin cancer Mother 50       MELANOMA  . Hypertension Mother   . Endometrial cancer Mother        on radiation treatments  . Uterine cancer Mother 38  . Hyperlipidemia Mother   . Lung cancer Maternal Grandmother 80  . Uterine cancer Maternal Grandmother 32  . Hypertension Father   . Stroke Father   . Hyperlipidemia Father   . Heart attack Maternal Grandfather   . Heart attack Paternal Grandmother   . Heart attack Paternal Grandfather   . Stomach cancer Neg Hx   . Colon cancer Neg Hx   . Esophageal cancer Neg Hx   . Pancreatic cancer Neg Hx     Social History   Socioeconomic History  . Marital status: Single    Spouse name: Not on file  . Number of children: 0  . Years of education: 19  . Highest education level: Associate degree: occupational, Hotel manager, or vocational program  Occupational History  . Occupation: bartender     Comment: Clinical cytogeneticist   Tobacco Use  . Smoking status: Never Smoker  . Smokeless tobacco: Never Used  Vaping Use  . Vaping Use: Never used   Substance and Sexual Activity  . Alcohol use: Yes    Alcohol/week: 2.0 standard drinks    Types: 2 Glasses of wine per week    Comment: social   . Drug use: No  . Sexual activity: Yes    Partners: Male    Birth control/protection: I.U.D.  Other Topics Concern  . Not on file  Social History Narrative   Se works as a Chief Operating Officer and is going to school full time to become a Dietitian    Worried about her mother, getting treatment for endometrial cancer   She was married previously from 2013 till 2016 - divorced since 2017   Social Determinants of Health   Financial Resource Strain: Not on file  Food Insecurity: Not on file  Transportation Needs: Not on file  Physical Activity: Not on file  Stress: Not on file  Social Connections: Not on file  Intimate Partner Violence: Not on file     Physical Exam: General:   Alert,  well-nourished, pleasant and cooperative in  NAD Head:  Normocephalic and atraumatic. Eyes:  Sclera clear, no icterus.   Conjunctiva pink. Ears:  Normal auditory acuity. Nose:  No deformity, discharge,  or lesions. Mouth:  No deformity or lesions.   Abdomen:  Soft, nontender, nondistended, no tympany, normal bowel sounds, no rebound or guarding. No hepatosplenomegaly.   Neurologic:  Alert and  oriented x4;  grossly nonfocal Skin:  Intact without significant lesions or rashes. Psych:  Alert and cooperative. Normal mood and affect.    Donnika Kucher L. Tarri Glenn, MD, MPH 04/27/2020, 1:42 PM

## 2020-04-27 NOTE — Patient Instructions (Addendum)
I recommended additional evaluation for your ongoing bloating.    Please stop by the lab for an H. pylori stool antigen test.  We will arrange for a breath test for bacterial overgrowth.  Antibiotics will be recommended if either of these tests is negative. Your provider has requested that you go to the basement level for lab work before leaving today. Press "B" on the elevator. The lab is located at the first door on the left as you exit the elevator.  Due to recent changes in healthcare laws, you may see the results of your imaging and laboratory studies on MyChart before your provider has had a chance to review them.  We understand that in some cases there may be results that are confusing or concerning to you. Not all laboratory results come back in the same time frame and the provider may be waiting for multiple results in order to interpret others.  Please give [place] 48 hours in order for your provider to thoroughly review all the results before contacting the office for clarification of your results.    Please discuss your bloating with your gynecologist at your upcoming appointment.   I am recommending a trial of FDGard - 2 capsules taken twice daily for 4 weeks.  Please take FDGard 30 to 60 minutes before meals with water.  There is no distinct pattern of side effects with FDGard, but, sometimes patients experience a mild tingling sensation in the gut within the first 30 minutes. This sensation should subsequently subside.   If the FD guard provides improvement but is too expensive you could try to substitute with Heather's Tummy Tamers, which are available on [email or web address].  Please call me in 1 month with an update.  If your symptoms are not improving at that time we will arrange for an abdominal ultrasound.  You are scheduled to follow up in the office on 06-15-20 at 2:30pm.  You have been given a testing kit to check for small intestine bacterial overgrowth (SIBO) which is completed by a company  named [facility]. Make sure to return your test in the mail using the return mailing label given to you along with the kit. Your demographic and insurance information have already been sent to the company and they should be in contact with you over the next week regarding this test. [facility] will collect an upfront charge of $99.74 for commercial insurance plans and $209.74 is you are paying cash. Make sure to discuss with [facility] PRIOR to having the test if they have gotten informatoin from your insurance company as to how much your testing will cost out of pocket, if any. Please keep in mind that you will be getting a call from phone number [phone] or a similar number. If you do not hear from them within this time frame, please call our office at [phone].   Thank you for entrusting me with your care and choosing [facility].  Dr Tarri Glenn

## 2020-05-04 ENCOUNTER — Ambulatory Visit: Payer: 59 | Admitting: Adult Health

## 2020-05-25 ENCOUNTER — Other Ambulatory Visit: Payer: Self-pay

## 2020-05-25 ENCOUNTER — Ambulatory Visit (INDEPENDENT_AMBULATORY_CARE_PROVIDER_SITE_OTHER): Payer: 59 | Admitting: Adult Health

## 2020-05-25 ENCOUNTER — Encounter: Payer: Self-pay | Admitting: Adult Health

## 2020-05-25 DIAGNOSIS — F909 Attention-deficit hyperactivity disorder, unspecified type: Secondary | ICD-10-CM

## 2020-05-25 DIAGNOSIS — F411 Generalized anxiety disorder: Secondary | ICD-10-CM

## 2020-05-25 DIAGNOSIS — F331 Major depressive disorder, recurrent, moderate: Secondary | ICD-10-CM

## 2020-05-25 MED ORDER — AMPHETAMINE-DEXTROAMPHETAMINE 20 MG PO TABS: 20.0000 mg | ORAL_TABLET | Freq: Every day | ORAL | 0 refills | Status: DC

## 2020-05-25 MED ORDER — BUPROPION HCL ER (XL) 300 MG PO TB24: 300.0000 mg | ORAL_TABLET | Freq: Every day | ORAL | 5 refills | Status: DC

## 2020-05-25 NOTE — Progress Notes (Signed)
Briana Reed [id number] 15-Oct-1985 35 y.o.  Subjective:   Patient ID:  Briana Reed is a 35 y.o. (DOB 1985-11-22) female.  Chief Complaint: No chief complaint on file.   HPI Briana Reed presents to the office today for follow-up of MDD of GAD.   Describes mood today as "ok". Tearful at times - "overwhelmed". Mood symptoms - reports decreased depression, anxiety, and irritability. Feels like Wellbutrin continues to work well. Would like to try ADHD medication. Improved interest and motivation. Taking medications as prescribed.  Energy levels improved. Active, does not have a regular exercise routine.  Enjoys some usual interests and activities. Single. Dating - has a boyfriend of 1 year. Lives alone 3 cats. Sister and mother in [place]. Father in [place]. Talking to friends. Appetite adequate. Weight stable - 173 pounds. Having some GI issues - working with [employer]. Sleeps well most nights. Averages 6 to 7 hours. Focus and concentration difficulties. Working on an associates in Ecologist. Completing tasks. Managing aspects of household. 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  Denies SI or HI Denies AH or VH. Seeing therapist every 2 weeks - Estill Bakes.  Previous medication trials: Wellbutrin XL, Trintellix   AUDIT   Flowsheet Row Office Visit from 10/06/2018 in Primary Care at [facility]  Alcohol Use Disorder Identification Test Final Score (AUDIT) 6    GAD-7   Flowsheet Row Office Visit from 09/21/2019 in [facility] from 02/11/2019 in [facility] from 02/11/2018 in Primary Care at [facility]  Total GAD-7 Score 18 17 4     PHQ2-9   Flowsheet Row Office Visit from 09/21/2019 in [facility] from 04/17/2019 in [facility] from 02/11/2019 in [facility] from 01/02/2019 in  Primary Care at 01/04/2019 from 12/02/2018 in Primary Care at [facility] Total Score 3 6 2  0 6  PHQ-9 Total Score 13 20 12  -- 11       Review of Systems:  Review of Systems  Musculoskeletal: Negative for gait problem.  Neurological: Negative for tremors.  Psychiatric/Behavioral:       Please refer to HPI    Medications: I have reviewed the patient's current medications.  Current Outpatient Medications  Medication Sig Dispense Refill  . amphetamine-dextroamphetamine (ADDERALL) 20 MG tablet Take 1 tablet (20 mg total) by mouth daily. 30 tablet 0  . ascorbic acid (VITAMIN C) 500 MG tablet Take 500 mg by mouth daily.    [facility] buPROPion (WELLBUTRIN XL) 300 MG 24 hr tablet Take 1 tablet (300 mg total) by mouth daily. 30 tablet 5  . Cholecalciferol (VITAMIN D-3) 5000 UNIT/ML LIQD Place 5,000 Int'l Units/L under the tongue daily.    levonorgestrel (MIRENA, 52 MG,) 20 MCG/24HR IUD 1 Intra Uterine Device (1 each total) by Intrauterine route once for 1 dose. (Patient taking differently: 1 each by Intrauterine route once. Inserted 08/12/2017 by GYN, needs to be removed 08/2022.) 1 Intra Uterine Device 0  . Zinc 30 MG CAPS Take 1 capsule by mouth daily.     No current facility-administered medications for this visit.    Medication Side Effects: None  Allergies: No Known Allergies  Past Medical History:  Diagnosis Date  . Anemia   . Anxiety   . Depression   . GERD (gastroesophageal reflux disease)   . History of Papanicolaou smear of cervix 10/28/12; 08/02/15  NEG; LGSIL, HPV  . Immunization, viral disease    GARDASIL COMPLETED  . Vitamin D deficiency 07/2011    Family History  Problem Relation Age of Onset  . Skin cancer Mother 29       MELANOMA  . Hypertension Mother   . Endometrial cancer Mother        on radiation treatments  . Uterine cancer Mother 56  . Hyperlipidemia Mother   . Lung cancer Maternal Grandmother 80  . Uterine cancer Maternal Grandmother 65  .  Hypertension Father   . Stroke Father   . Hyperlipidemia Father   . Heart attack Maternal Grandfather   . Heart attack Paternal Grandmother   . Heart attack Paternal Grandfather   . Stomach cancer Neg Hx   . Colon cancer Neg Hx   . Esophageal cancer Neg Hx   . Pancreatic cancer Neg Hx     Social History   Socioeconomic History  . Marital status: Single    Spouse name: Not on file  . Number of children: 0  . Years of education: 60  . Highest education level: Associate degree: occupational, Scientist, product/process development, or vocational program  Occupational History  . Occupation: bartender     Comment: Tourist information centre manager   Tobacco Use  . Smoking status: Never Smoker  . Smokeless tobacco: Never Used  Vaping Use  . Vaping Use: Never used  Substance and Sexual Activity  . Alcohol use: Yes    Alcohol/week: 2.0 standard drinks    Types: 2 Glasses of wine per week    Comment: social   . Drug use: No  . Sexual activity: Yes    Partners: Male    Birth control/protection: I.U.D.  Other Topics Concern  . 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   Please see review of systems for further details on the patient's review from today.   Objective:   Physical Exam:  There were no vitals taken for this visit.  Physical Exam Constitutional:      General: She is not in acute distress. Musculoskeletal:         General: No deformity.  Neurological:     Mental Status: She is alert and oriented to person, place, and time.     Coordination: Coordination normal.  Psychiatric:        Attention and Perception: Attention and perception normal. She does not perceive auditory or visual hallucinations.        Mood and Affect: Mood normal. Mood is not anxious or depressed. Affect is not labile, blunt, angry or inappropriate.        Speech: Speech normal.        Behavior: Behavior normal.        Thought Content: Thought content normal. Thought content is not paranoid or delusional. Thought content does not include homicidal or suicidal  ideation. Thought content does not include homicidal or suicidal plan.        Cognition and Memory: Cognition and memory normal.        Judgment: Judgment normal.     Comments: Insight intact     Lab Review:     Component Value Date/Time   NA 138 02/11/2019 1418   NA 141 02/11/2018 1624   K 4.1 02/11/2019 1418   CL 105 02/11/2019 1418   CO2 24 02/11/2019 1418   GLUCOSE 95 02/11/2019 1418   BUN 9 02/11/2019 1418   BUN 8 02/11/2018 1624   CREATININE 0.62 02/11/2019 1418   CREATININE 0.65 06/12/2017 1010   CALCIUM 9.8 02/11/2019 1418   PROT 7.3 02/11/2019 1418   PROT 7.2 02/11/2018 1624   ALBUMIN 4.6 02/11/2019 1418   ALBUMIN 4.5 02/11/2018 1624   AST 11 02/11/2019 1418   ALT 8 02/11/2019 1418   ALKPHOS 52 02/11/2019 1418   BILITOT 0.5 02/11/2019 1418   BILITOT 0.2 02/11/2018 1624   GFRNONAA 121 02/11/2018 1624   GFRAA 140 02/11/2018 1624       Component Value Date/Time   WBC 7.1 02/11/2019 1433   RBC 4.11 02/11/2019 1433   HGB 13.4 02/11/2019 1433   HGB 13.3 02/11/2018 1624   HCT 39.7 02/11/2019 1433   HCT 39.1 02/11/2018 1624   PLT 319.0 02/11/2019 1433   PLT 336 02/11/2018 1624   MCV 96.6 02/11/2019 1433   MCV 96 02/11/2018 1624   MCH 32.6 02/11/2018 1624   MCH 32.1 06/12/2017 1010   MCHC 33.7 02/11/2019 1433   RDW 12.9 02/11/2019 1433    RDW 12.4 02/11/2018 1624   LYMPHSABS 1.5 02/11/2019 1433   LYMPHSABS 1.8 02/11/2018 1624   MONOABS 0.5 02/11/2019 1433   EOSABS 0.0 02/11/2019 1433   EOSABS 0.0 02/11/2018 1624   BASOSABS 0.0 02/11/2019 1433   BASOSABS 0.0 02/11/2018 1624    No results found for: POCLITH, LITHIUM   No results found for: PHENYTOIN, PHENOBARB, VALPROATE, CBMZ   .res Assessment: Plan:    Plan:  PDMP reviewed  1. Continue Wellbutrin XL 300mg  daily. 2. Add Adderall 20mg  - 1/2 tab bid  116/72 - 79  Completed [facility] ADHD testing 47/58 - ADHD likely   Meets DSM diagnostic criteria for ADHD diagnosis  RTC 4 weeks  Patient advised to contact office with any questions, adverse effects, or acute worsening in signs and symptoms.  Discussed potential benefits, risks, and side effects of stimulants with patient to include increased heart rate, palpitations, insomnia, increased anxiety, increased irritability, or decreased appetite.  Instructed patient to contact office if experiencing any significant tolerability issues.   Diagnoses and all orders for this visit:  Attention deficit hyperactivity disorder (ADHD), unspecified ADHD type -     amphetamine-dextroamphetamine (ADDERALL) 20 MG tablet; Take 1 tablet (20 mg total) by mouth daily.  Generalized anxiety disorder -     buPROPion (WELLBUTRIN XL) 300 MG 24 hr tablet; Take 1 tablet (300 mg total) by mouth daily.  Major depressive disorder, recurrent episode, moderate (HCC) -     buPROPion (WELLBUTRIN XL) 300 MG 24 hr tablet; Take 1 tablet (300 mg total) by mouth daily.     Please see After Visit Summary for patient specific instructions.  Future Appointments  Date Time Provider Department Center  06/15/2020  3:40 PM 12-08-2003, MD [facility]    No orders of the defined types were placed in this encounter.   -------------------------------

## 2020-06-15 ENCOUNTER — Ambulatory Visit: Payer: 59 | Admitting: Gastroenterology

## 2020-06-23 ENCOUNTER — Other Ambulatory Visit: Payer: Self-pay

## 2020-06-23 ENCOUNTER — Ambulatory Visit (INDEPENDENT_AMBULATORY_CARE_PROVIDER_SITE_OTHER): Payer: 59 | Admitting: Adult Health

## 2020-06-23 ENCOUNTER — Encounter: Payer: Self-pay | Admitting: Adult Health

## 2020-06-23 ENCOUNTER — Telehealth: Payer: Self-pay | Admitting: Adult Health

## 2020-06-23 DIAGNOSIS — F909 Attention-deficit hyperactivity disorder, unspecified type: Secondary | ICD-10-CM | POA: Diagnosis not present

## 2020-06-23 DIAGNOSIS — F331 Major depressive disorder, recurrent, moderate: Secondary | ICD-10-CM | POA: Diagnosis not present

## 2020-06-23 DIAGNOSIS — F411 Generalized anxiety disorder: Secondary | ICD-10-CM

## 2020-06-23 MED ORDER — AMPHETAMINE-DEXTROAMPHETAMINE 20 MG PO TABS: 20.0000 mg | ORAL_TABLET | Freq: Every day | ORAL | 0 refills | Status: DC

## 2020-06-23 MED ORDER — AMPHETAMINE-DEXTROAMPHETAMINE 20 MG PO TABS: 20.0000 mg | ORAL_TABLET | Freq: Two times a day (BID) | ORAL | 0 refills | Status: DC

## 2020-06-23 NOTE — Progress Notes (Signed)
Briana Reed [id number] 09-14-1985 35 y.o.  Subjective:   Patient ID:  Briana Reed is a 35 y.o. (DOB [birth year]) female.  Chief Complaint: No chief complaint on file.   HPI Briana Reed presents to the office today for follow-up of MDD, ADHD and GAD.   Describes mood today as "ok". Pleasant. Decreased tearfulness. at times. Mood symptoms - reports decreased depression, anxiety, and irritability. Feels like Wellbutrin continues to work well. Addition of Adderall has been helpful. Not feeling as overwhelmed. Decreased anxiety. Feels like she can start tasks and get them done. Feels more focused. Short term memory is better at work. Feels more confident. Improved interest and motivation. Taking medications as prescribed.  Energy levels improved. Active, does not have a regular exercise routine.  Enjoys some usual interests and activities. Single. Dating - has a boyfriend of 1 year. Lives alone 3 cats. Sister and mother in [place]. Father in [place]. Talking to friends. Appetite adequate. Weight stable - 173 pounds. Decreased GI issues. Sleeps well most nights. Averages 7 to 8 hours. Focus and concentration difficulties. Working on an associates in Ecologist. Completing tasks. Managing aspects of household. 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  Denies SI or HI Denies AH or . Seeing therapist every 2 weeks - Estill Bakes.  Previous medication trials: Wellbutrin XL, Trintellix  AUDIT   Flowsheet Row Office Visit from 10/06/2018 in Primary Care at [facility]  Alcohol Use Disorder Identification Test Final Score (AUDIT) 6    GAD-7   Flowsheet Row Office Visit from 09/21/2019 in [facility] from 02/11/2019 in [facility] from 02/11/2018 in Primary Care at [place]  Total GAD-7 Score 18 17 4     PHQ2-9   Flowsheet Row Office Visit from 09/21/2019 in [facility] from 04/17/2019 in [facility] from 02/11/2019 in [facility] from 01/02/2019 in Primary Care at 01/04/2019 from 12/02/2018 in [facility] at [facility] Total Score 3 6 2  0 6  PHQ-9 Total Score 13 20 12  -- 11       Review of Systems:  Review of Systems  Musculoskeletal: Negative for gait problem.  Neurological: Negative for tremors.  Psychiatric/Behavioral:       Please refer to HPI    Medications: I have reviewed the patient's current medications.  Current Outpatient Medications  Medication Sig Dispense Refill  . [START ON 07/21/2020] amphetamine-dextroamphetamine (ADDERALL) 20 MG tablet Take 1 tablet (20 mg total) by mouth 2 (two) times daily. 60 tablet 0  . amphetamine-dextroamphetamine (ADDERALL) 20 MG tablet Take 1 tablet (20 mg total) by mouth daily. 30 tablet 0  . ascorbic acid (VITAMIN C) 500 MG tablet Take 500 mg by mouth daily.    buPROPion (WELLBUTRIN XL) 300 MG 24 hr tablet Take 1 tablet (300 mg total) by mouth daily. 30 tablet 5  . Cholecalciferol (VITAMIN D-3) 5000 UNIT/ML LIQD Place 5,000 Int'l Units/L under the tongue daily.    levonorgestrel (MIRENA, 52 MG,) 20 MCG/24HR IUD 1 Intra Uterine Device (1 each total) by Intrauterine route once for 1 dose. (Patient taking differently: 1 each by Intrauterine route once. Inserted 08/12/2017 by GYN, needs to be removed 08/2022.) 1 Intra Uterine Device 0  . Zinc 30 MG CAPS Take 1 capsule by mouth daily.     No current facility-administered medications for this  visit.    Medication Side Effects: None  Allergies: No Known Allergies  Past Medical History:  Diagnosis Date  . Anemia   . Anxiety   . Depression   . GERD (gastroesophageal reflux disease)   . History of Papanicolaou smear of cervix 10/28/12; 08/02/15   NEG; LGSIL, HPV  . Immunization, viral disease    GARDASIL COMPLETED  . Vitamin D deficiency 07/2011    Family  History  Problem Relation Age of Onset  . Skin cancer Mother [id number]       MELANOMA  . Hypertension Mother   . Endometrial cancer Mother        on radiation treatments  . Uterine cancer Mother [id number]  . Hyperlipidemia Mother   . Lung cancer Maternal Grandmother 80  . Uterine cancer Maternal Grandmother 65  . Hypertension Father   . Stroke Father   . Hyperlipidemia Father   . Heart attack Maternal Grandfather   . Heart attack Paternal Grandmother   . Heart attack Paternal Grandfather   . Stomach cancer Neg Hx   . Colon cancer Neg Hx   . Esophageal cancer Neg Hx   . Pancreatic cancer Neg Hx     Social History   Socioeconomic History  . Marital status: Single    Spouse name: Not on file  . Number of children: 0  . Years of education: 8814  . Highest education level: Associate degree: occupational, Scientist, product/process developmenttechnical, or vocational program  Occupational History  . Occupation: bartender     Comment: [employer]ist [employer] centre [employer] -restaurant   Tobacco Use  . Smoking status: Never Smoker  . Smokeless tobacco: Never Used  Vaping Use  . Vaping Use: Never used  Substance and Sexual Activity  . Alcohol use: Yes    Alcohol/week: 2.0 standard drinks    Types: 2 Glasses of wine per week    Comment: social   . Drug use: No  . Sexual activity: Yes    Partners: Male    Birth control/protection: I.U.D.  Other Topics Concern  . 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 and is going to school full time to become a Research officer, trade unionmortician    Worried about her mother, getting treatment for endometrial cancer   She was married previously from 2013 till 2016 - divorced since 2017   Social Determinants of Health   Financial Resource Strain: Not on file  Food Insecurity: Not on file  Transportation Needs: Not on file  Physical Activity: Not on file  Stress: Not on file  Social Connections: Not on file  Intimate Partner Violence: Not on file    Past Medical History, Surgical history, Social history, and Family  history were reviewed and updated as appropriate.   Please see review of systems for further details on the patient's review from today.   Objective:   Physical Exam:  There were no vitals taken for this visit.  Physical Exam Constitutional:      General: She is not in acute distress. Musculoskeletal:        General: No deformity.  Neurological:     Mental Status: She is alert and oriented to person, place, and time.     Coordination: Coordination normal.  Psychiatric:        Attention and Perception: Attention and perception normal. She does not perceive auditory or visual hallucinations.        Mood and Affect: Mood normal. Mood is not anxious or depressed. Affect is not labile,  blunt, angry or inappropriate.        Speech: Speech normal.        Behavior: Behavior normal.        Thought Content: Thought content normal. Thought content is not paranoid or delusional. Thought content does not include homicidal or suicidal ideation. Thought content does not include homicidal or suicidal plan.        Cognition and Memory: Cognition and memory normal.        Judgment: Judgment normal.     Comments: Insight intact     Lab Review:     Component Value Date/Time   NA 138 02/11/2019 1418   NA 141 02/11/2018 1624   K 4.1 02/11/2019 1418   CL 105 02/11/2019 1418   CO2 24 02/11/2019 1418   GLUCOSE 95 02/11/2019 1418   BUN 9 02/11/2019 1418   BUN 8 02/11/2018 1624   CREATININE 0.62 02/11/2019 1418   CREATININE 0.65 06/12/2017 1010   CALCIUM 9.8 02/11/2019 1418   PROT 7.3 02/11/2019 1418   PROT 7.2 02/11/2018 1624   ALBUMIN 4.6 02/11/2019 1418   ALBUMIN 4.5 02/11/2018 1624   AST 11 02/11/2019 1418   ALT 8 02/11/2019 1418   ALKPHOS 52 02/11/2019 1418   BILITOT 0.5 02/11/2019 1418   BILITOT 0.2 02/11/2018 1624   GFRNONAA 121 02/11/2018 1624   GFRAA 140 02/11/2018 1624       Component Value Date/Time   WBC 7.1 02/11/2019 1433   RBC 4.11 02/11/2019 1433   HGB 13.4 02/11/2019  1433   HGB 13.3 02/11/2018 1624   HCT 39.7 02/11/2019 1433   HCT 39.1 02/11/2018 1624   PLT 319.0 02/11/2019 1433   PLT 336 02/11/2018 1624   MCV 96.6 02/11/2019 1433   MCV 96 02/11/2018 1624   MCH 32.6 02/11/2018 1624   MCH 32.1 06/12/2017 1010   MCHC 33.7 02/11/2019 1433   RDW 12.9 02/11/2019 1433   RDW 12.4 02/11/2018 1624   LYMPHSABS 1.5 02/11/2019 1433   LYMPHSABS 1.8 02/11/2018 1624   MONOABS 0.5 02/11/2019 1433   EOSABS 0.0 02/11/2019 1433   EOSABS 0.0 02/11/2018 1624   BASOSABS 0.0 02/11/2019 1433   BASOSABS 0.0 02/11/2018 1624    No results found for: POCLITH, LITHIUM   No results found for: PHENYTOIN, PHENOBARB, VALPROATE, CBMZ   .res Assessment: Plan:    Plan:  PDMP reviewed  1. Continue Wellbutrin XL 300mg  daily. 2. Continue Adderall 20mg  - 1/2 tab bid  111/71 - 85   Completed [facility] testing 47/58 - ADHD likely   Meets DSM diagnostic criteria for ADHD diagnosis  RTC 4 weeks  Patient advised to contact office with any questions, adverse effects, or acute worsening in signs and symptoms.  Discussed potential benefits, risks, and side effects of stimulants with patient to include increased heart rate, palpitations, insomnia, increased anxiety, increased irritability, or decreased appetite.  Instructed patient to contact office if experiencing any significant tolerability issues.     Diagnoses and all orders for this visit:  Attention deficit hyperactivity disorder (ADHD), unspecified ADHD type -     amphetamine-dextroamphetamine (ADDERALL) 20 MG tablet; Take 1 tablet (20 mg total) by mouth daily. -     amphetamine-dextroamphetamine (ADDERALL) 20 MG tablet; Take 1 tablet (20 mg total) by mouth 2 (two) times daily.     Please see After Visit Summary for patient specific instructions.  Future Appointments  Date Time Provider Department Center  07/14/2020  3:00 PM 12-08-2003, MD  [facility]    No orders of the defined  types were placed in this encounter.   -------------------------------

## 2020-06-23 NOTE — Telephone Encounter (Signed)
Fill the one for today today - then fill the one on 07/21/2020 on 07/21/2020.

## 2020-06-23 NOTE — Telephone Encounter (Signed)
[facility] pharmacy called in about Briana Reed's prescription. Says they have two prescriptions for Adderal one for today and one for 3/17. Would like to know which to fill or get rid of. [phone]

## 2020-07-14 ENCOUNTER — Ambulatory Visit: Payer: 59 | Admitting: Gastroenterology

## 2020-07-18 ENCOUNTER — Other Ambulatory Visit: Payer: Self-pay

## 2020-07-18 ENCOUNTER — Ambulatory Visit (HOSPITAL_COMMUNITY)
Admission: RE | Admit: 2020-07-18 | Discharge: 2020-07-18 | Disposition: A | Discharge status: Home / Self Care | Payer: 59 | Source: Ambulatory Visit | Attending: Emergency Medicine | Admitting: Emergency Medicine

## 2020-07-18 ENCOUNTER — Encounter (HOSPITAL_COMMUNITY): Payer: Self-pay

## 2020-07-18 VITALS — BP 118/79 | HR 88 | Temp 99.0°F | Resp 18

## 2020-07-18 DIAGNOSIS — Z113 Encounter for screening for infections with a predominantly sexual mode of transmission: Secondary | ICD-10-CM

## 2020-07-18 NOTE — Discharge Instructions (Addendum)
Lab results in 2-3 days, you will be called if positive for treatment  Refrain from sex until labs result, if positive refrain from sex until you and your partner complete treatment

## 2020-07-18 NOTE — ED Provider Notes (Signed)
[facility]    CSN: [id number] Arrival date & time: 07/18/20  1248      History   Chief Complaint Chief Complaint  Patient presents with  . Genital Lesions    HPI Briana RIEHLE is a 35 y.o. female.   Patient presents with genital lesions starting three days ago. Area very tender, became blister like but starting to go away. Has burning sensation. Denies discharge, itching, odor, frequency, urgency, abdominal pain, flank pain. LMP- unknown, has IUD. Partner had blister rash on penis during intercourse on Friday. 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   Past Medical History:  Diagnosis Date  . Anemia   . Anxiety   . Depression   . GERD (gastroesophageal reflux disease)   . History of Papanicolaou smear of cervix 10/28/12; 08/02/15   NEG; LGSIL, HPV  . Immunization, viral disease    GARDASIL COMPLETED  . Vitamin D deficiency 07/2011    Patient Active Problem List   Diagnosis Date Noted  . Anxiety state 12/02/2018  . Mild recurrent major depression (HCC) 10/06/2018  . Herpetic whitlow 12/26/2017  . LGSIL on Pap smear of cervix 10/14/2017  . Cervical high risk human papillomavirus (HPV) DNA test positive 08/05/2017    Past Surgical History:  Procedure Laterality Date  . ESOPHAGOGASTRODUODENOSCOPY  2008   WNL    OB History    Gravida  0   Para  0   Term  0   Preterm  0   AB  0   Living  0     SAB  0   IAB  0   Ectopic  0   Multiple  0   Live Births  0            Home Medications    Prior to Admission medications   Medication Sig Start Date End Date Taking? Authorizing Provider  amphetamine-dextroamphetamine (ADDERALL) 20 MG tablet Take 1 tablet (20 mg total) by mouth daily. 06/23/20   Mozingo, Thereasa Solo, NP  amphetamine-dextroamphetamine (ADDERALL) 20 MG tablet Take 1 tablet (20 mg total) by mouth daily. 07/21/20   Mozingo, Thereasa Solo, NP  ascorbic acid (VITAMIN C) 500 MG tablet Take 500 mg by mouth daily.     [provider]  buPROPion (WELLBUTRIN XL) 300 MG 24 hr tablet Take 1 tablet (300 mg total) by mouth daily. 05/25/20   Mozingo, Thereasa Solo, NP  Cholecalciferol (VITAMIN D-3) 5000 UNIT/ML LIQD Place 5,000 Int'l Units/L under the tongue daily.    [provider]  levonorgestrel (MIRENA, 52 MG,) 20 MCG/24HR IUD 1 Intra Uterine Device (1 each total) by Intrauterine route once for 1 dose. Patient taking differently: 1 each by Intrauterine route once. Inserted 08/12/2017 by GYN, needs to be removed 08/2022. 08/12/17 02/11/19  Copland, Ilona Sorrel, PA-C  Zinc 30 MG CAPS Take 1 capsule by mouth daily.    [provider]    Family History Family History  Problem Relation Age of Onset  . Skin cancer Mother 46       MELANOMA  . Hypertension Mother   . Endometrial cancer Mother        on radiation treatments  . Uterine cancer Mother 77  . Hyperlipidemia Mother   . Lung cancer Maternal Grandmother 80  . Uterine cancer Maternal Grandmother 65  . Hypertension Father   . Stroke Father   . Hyperlipidemia Father   . Heart attack Maternal Grandfather   . Heart attack Paternal Grandmother   .  Heart attack Paternal Grandfather   . Stomach cancer Neg Hx   . Colon cancer Neg Hx   . Esophageal cancer Neg Hx   . Pancreatic cancer Neg Hx     Social History Social History   Tobacco Use  . Smoking status: Never Smoker  . Smokeless tobacco: Never Used  Vaping Use  . Vaping Use: Never used  Substance Use Topics  . Alcohol use: Yes    Alcohol/week: 2.0 standard drinks    Types: 2 Glasses of wine per week    Comment: social   . Drug use: No     Allergies   Patient has no known allergies.   Review of Systems Review of Systems  Constitutional: Negative.   Respiratory: Negative.   Cardiovascular: Negative.   Gastrointestinal: Negative.   Genitourinary: Positive for genital sores. Negative for decreased urine volume, difficulty urinating, dyspareunia, dysuria, enuresis,  flank pain, frequency, hematuria, menstrual problem, pelvic pain, urgency, vaginal bleeding, vaginal discharge and vaginal pain.  Skin: Negative.      Physical Exam Triage Vital Signs ED Triage Vitals  Enc Vitals Group     BP 07/18/20 1320 118/79     Pulse Rate 07/18/20 1320 88     Resp 07/18/20 1320 18     Temp 07/18/20 1320 99 F (37.2 C)     Temp Source 07/18/20 1320 Oral     SpO2 07/18/20 1320 98 %     Weight --      Height --      Head Circumference --      Peak Flow --      Pain Score 07/18/20 1319 5     Pain Loc --      Pain Edu? --      Excl. in GC? --    No data found.  Updated Vital Signs BP 118/79 (BP Location: Right Arm)   Pulse 88   Temp 99 F (37.2 C) (Oral)   Resp 18   SpO2 98%   Visual Acuity Right Eye Distance:   Left Eye Distance:   Bilateral Distance:    Right Eye Near:   Left Eye Near:    Bilateral Near:     Physical Exam Constitutional:      Appearance: Normal appearance. She is normal weight.  Eyes:     Extraocular Movements: Extraocular movements intact.  Pulmonary:     Effort: Pulmonary effort is normal.  Genitourinary:      Comments: Small red macular spots at base of labia and along lower left of labia, tender, no drainage  Musculoskeletal:        General: Normal range of motion.  Skin:    General: Skin is warm and dry.  Neurological:     Mental Status: She is alert and oriented to person, place, and time. Mental status is at baseline.  Psychiatric:        Mood and Affect: Mood normal.        Behavior: Behavior normal.        Thought Content: Thought content normal.        Judgment: Judgment normal.      UC Treatments / Results  Labs (all labs ordered are listed, but only abnormal results are displayed) Labs Reviewed  HSV CULTURE AND TYPING  CERVICOVAGINAL ANCILLARY ONLY    EKG   Radiology No results found.  Procedures Procedures (including critical care time)  Medications Ordered in UC Medications - No  data to display  Initial  Impression / Assessment and Plan / UC Course  I have reviewed the triage vital signs and the nursing notes.  Pertinent labs & imaging results that were available during my care of the patient were reviewed by me and considered in my medical decision making (see chart for details).  Routine screening for sti  1. HSV swab collected, if negative and blisters return, patient instructed to be assessed the first day noticed 2. sti screen- pending 2-3 days, to treat if positive per protocol 3. Advised to refrain from sex until labs result, if positive to refrain until both her and partner have completed treatment  Final Clinical Impressions(s) / UC Diagnoses   Final diagnoses:  Routine screening for STI (sexually transmitted infection)     Discharge Instructions     Lab results in 2-3 days, you will be called if positive for treatment  Refrain from sex until labs result, if positive refrain from sex until you and your partner complete treatment     ED Prescriptions    None     PDMP not reviewed this encounter.   Valinda Hoar, NP 07/18/20 1408

## 2020-07-18 NOTE — ED Triage Notes (Signed)
Pt presents with genital lesions on her vaginal area that is very painful X 3 days.

## 2020-07-19 ENCOUNTER — Telehealth (HOSPITAL_COMMUNITY): Payer: Self-pay | Admitting: Emergency Medicine

## 2020-07-19 LAB — CERVICOVAGINAL ANCILLARY ONLY
Bacterial Vaginitis (gardnerella): NEGATIVE
Candida Glabrata: NEGATIVE
Candida Vaginitis: POSITIVE — AB
Chlamydia: NEGATIVE
Comment: NEGATIVE
Comment: NEGATIVE
Comment: NEGATIVE
Comment: NEGATIVE
Comment: NEGATIVE
Comment: NORMAL
Neisseria Gonorrhea: NEGATIVE
Trichomonas: NEGATIVE

## 2020-07-19 MED ORDER — FLUCONAZOLE 150 MG PO TABS: 150.0000 mg | ORAL_TABLET | Freq: Once | ORAL | 0 refills | Status: AC

## 2020-07-21 ENCOUNTER — Telehealth (HOSPITAL_COMMUNITY): Payer: Self-pay | Admitting: Emergency Medicine

## 2020-07-21 LAB — HSV CULTURE AND TYPING

## 2020-07-21 MED ORDER — ACYCLOVIR 400 MG PO TABS: 400.0000 mg | ORAL_TABLET | Freq: Three times a day (TID) | ORAL | 0 refills | Status: AC

## 2020-07-25 ENCOUNTER — Ambulatory Visit: Payer: 59 | Admitting: Physician Assistant

## 2020-08-18 ENCOUNTER — Other Ambulatory Visit: Payer: Self-pay

## 2020-08-18 ENCOUNTER — Encounter: Payer: Self-pay | Admitting: Adult Health

## 2020-08-18 ENCOUNTER — Ambulatory Visit (INDEPENDENT_AMBULATORY_CARE_PROVIDER_SITE_OTHER): Payer: 59 | Admitting: Adult Health

## 2020-08-18 DIAGNOSIS — F909 Attention-deficit hyperactivity disorder, unspecified type: Secondary | ICD-10-CM | POA: Diagnosis not present

## 2020-08-18 DIAGNOSIS — F331 Major depressive disorder, recurrent, moderate: Secondary | ICD-10-CM

## 2020-08-18 DIAGNOSIS — F411 Generalized anxiety disorder: Secondary | ICD-10-CM

## 2020-08-18 MED ORDER — AMPHETAMINE-DEXTROAMPHETAMINE 20 MG PO TABS: 20.0000 mg | ORAL_TABLET | Freq: Every day | ORAL | 0 refills | Status: DC

## 2020-08-18 MED ORDER — BUPROPION HCL ER (XL) 300 MG PO TB24: 300.0000 mg | ORAL_TABLET | Freq: Every day | ORAL | 5 refills | Status: DC

## 2020-08-18 NOTE — Progress Notes (Signed)
Briana Reed [id number] 05/26/1985 35 y.o.  Subjective:   Patient ID:  Briana Reed is a 35 y.o. (DOB 1985-08-17) female.  Chief Complaint: No chief complaint on file.   HPI Briana Reed presents to the office today for follow-up of  MDD, ADHD and GAD.   Describes mood today as "ok". Pleasant. Decreased tearfulness. Mood symptoms - reports decreased depression, anxiety, and irritability. Stating "I'm doing better". Building "good"  habits and routines. Doing things she likes to do - hobbies. Feels like medications continue to work well. Improved interest and motivation. Taking medications as prescribed.  Energy levels improved. Active, does not have a regular exercise routine.  Enjoys some usual interests and activities. Single. Dating - has a boyfriend of 1 year. Lives alone 3 cats. Sister and mother in [place]. Father in [place]. Talking to friends. Appetite adequate. Weight stable - 173 pounds. Decreased GI issues. Sleeps well most nights. Averages 7 to 8 hours. Focus and concentration difficulties. Working on an associates in [employer] - "taking a break". Completing tasks. Managing aspects of household. 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  Denies SI or HI. Denies AH or VH. Seeing therapist every 2 weeks - Estill Bakes.  Previous medication trials: Wellbutrin XL, Trintellix   AUDIT   Flowsheet Row Office Visit from 10/06/2018 in Primary Care at [facility]  Alcohol Use Disorder Identification Test Final Score (AUDIT) 6    GAD-7   Flowsheet Row Office Visit from 09/21/2019 in [facility] Visit from 02/11/2019 in [facility] from 02/11/2018 in Primary Care at [place]  Total GAD-7 Score 18 17 4     PHQ2-9   Flowsheet Row Office Visit from 09/21/2019 in [facility] from 04/17/2019 in [facility] from 02/11/2019  in [facility] from 01/02/2019 in Primary Care at 01/04/2019 from 12/02/2018 in Primary Care at [facility] Total Score 3 6 2  0 6  PHQ-9 Total Score 13 20 12  -- 11    Flowsheet Row ED from 07/18/2020 in [facility] RISK CATEGORY No Risk       Review of Systems:  Review of Systems  Musculoskeletal: Negative for gait problem.  Neurological: Negative for tremors.  Psychiatric/Behavioral:       Please refer to HPI    Medications: I have reviewed the patient's current medications.  Current Outpatient Medications  Medication Sig Dispense Refill  . [START ON 10/13/2020] amphetamine-dextroamphetamine (ADDERALL) 20 MG tablet Take 1 tablet (20 mg total) by mouth daily. 30 tablet 0  . amphetamine-dextroamphetamine (ADDERALL) 20 MG tablet Take 1 tablet (20 mg total) by mouth daily. 30 tablet 0  . [START ON 09/15/2020] amphetamine-dextroamphetamine (ADDERALL) 20 MG tablet Take 1 tablet (20 mg total) by mouth daily. 30 tablet 0  . ascorbic acid (VITAMIN C) 500 MG tablet Take 500 mg by mouth daily.    [facility] buPROPion (WELLBUTRIN XL) 300 MG 24 hr tablet Take 1 tablet (300 mg total) by mouth daily. 30 tablet 5  . Cholecalciferol (VITAMIN D-3) 5000 UNIT/ML LIQD Place 5,000 Int'l Units/L under the tongue daily.    12/13/2020 levonorgestrel (MIRENA, 52 MG,) 20 MCG/24HR IUD 1 Intra Uterine Device (1 each total) by Intrauterine route once for 1 dose. (Patient taking differently: 1 each by Intrauterine route once. Inserted 08/12/2017 by GYN, needs to be removed 08/2022.)  1 Intra Uterine Device 0  . Zinc 30 MG CAPS Take 1 capsule by mouth daily.     No current facility-administered medications for this visit.    Medication Side Effects: None  Allergies: No Known Allergies  Past Medical History:  Diagnosis Date  . Anemia   . Anxiety   . Depression   . GERD (gastroesophageal reflux disease)   . History of Papanicolaou smear of cervix  10/28/12; 08/02/15   NEG; LGSIL, HPV  . Immunization, viral disease    GARDASIL COMPLETED  . Vitamin D deficiency 07/2011    Family History  Problem Relation Age of Onset  . Skin cancer Mother 2       MELANOMA  . Hypertension Mother   . Endometrial cancer Mother        on radiation treatments  . Uterine cancer Mother 7  . Hyperlipidemia Mother   . Lung cancer Maternal Grandmother 80  . Uterine cancer Maternal Grandmother 65  . Hypertension Father   . Stroke Father   . Hyperlipidemia Father   . Heart attack Maternal Grandfather   . Heart attack Paternal Grandmother   . Heart attack Paternal Grandfather   . Stomach cancer Neg Hx   . Colon cancer Neg Hx   . Esophageal cancer Neg Hx   . Pancreatic cancer Neg Hx     Social History   Socioeconomic History  . Marital status: Single    Spouse name: Not on file  . Number of children: 0  . Years of education: 25  . Highest education level: Associate degree: occupational, Scientist, product/process development, or vocational program  Occupational History  . Occupation: bartender     Comment: Tourist information centre manager   Tobacco Use  . Smoking status: Never Smoker  . Smokeless tobacco: Never Used  Vaping Use  . Vaping Use: Never used  Substance and Sexual Activity  . Alcohol use: Yes    Alcohol/week: 2.0 standard drinks    Types: 2 Glasses of wine per week    Comment: social   . Drug use: No  . Sexual activity: Yes    Partners: Male    Birth control/protection: I.U.D.  Other Topics Concern  . 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 and is going to school full time to become a Research officer, trade union    Worried about her mother, getting treatment for endometrial cancer   She was married previously from 2013 till 2016 - divorced since 2017   Social Determinants of Health   Financial Resource Strain: Not on file  Food Insecurity: Not on file  Transportation Needs: Not on file  Physical Activity: Not on file  Stress: Not on file  Social  Connections: Not on file  Intimate Partner Violence: Not on file    Past Medical History, Surgical history, Social history, and Family history were reviewed and updated as appropriate.   Please see review of systems for further details on the patient's review from today.   Objective:   Physical Exam:  There were no vitals taken for this visit.  Physical Exam Constitutional:      General: She is not in acute distress. Musculoskeletal:        General: No deformity.  Neurological:     Mental Status: She is alert and oriented to person, place, and time.     Coordination: Coordination normal.  Psychiatric:        Attention and Perception: Attention and perception normal. She does not  perceive auditory or visual hallucinations.        Mood and Affect: Mood normal. Mood is not anxious or depressed. Affect is not labile, blunt, angry or inappropriate.        Speech: Speech normal.        Behavior: Behavior normal.        Thought Content: Thought content normal. Thought content is not paranoid or delusional. Thought content does not include homicidal or suicidal ideation. Thought content does not include homicidal or suicidal plan.        Cognition and Memory: Cognition and memory normal.        Judgment: Judgment normal.     Comments: Insight intact     Lab Review:     Component Value Date/Time   NA 138 02/11/2019 1418   NA 141 02/11/2018 1624   K 4.1 02/11/2019 1418   CL 105 02/11/2019 1418   CO2 24 02/11/2019 1418   GLUCOSE 95 02/11/2019 1418   BUN 9 02/11/2019 1418   BUN 8 02/11/2018 1624   CREATININE 0.62 02/11/2019 1418   CREATININE 0.65 06/12/2017 1010   CALCIUM 9.8 02/11/2019 1418   PROT 7.3 02/11/2019 1418   PROT 7.2 02/11/2018 1624   ALBUMIN 4.6 02/11/2019 1418   ALBUMIN 4.5 02/11/2018 1624   AST 11 02/11/2019 1418   ALT 8 02/11/2019 1418   ALKPHOS 52 02/11/2019 1418   BILITOT 0.5 02/11/2019 1418   BILITOT 0.2 02/11/2018 1624   GFRNONAA 121 02/11/2018 1624    GFRAA 140 02/11/2018 1624       Component Value Date/Time   WBC 7.1 02/11/2019 1433   RBC 4.11 02/11/2019 1433   HGB 13.4 02/11/2019 1433   HGB 13.3 02/11/2018 1624   HCT 39.7 02/11/2019 1433   HCT 39.1 02/11/2018 1624   PLT 319.0 02/11/2019 1433   PLT 336 02/11/2018 1624   MCV 96.6 02/11/2019 1433   MCV 96 02/11/2018 1624   MCH 32.6 02/11/2018 1624   MCH 32.1 06/12/2017 1010   MCHC 33.7 02/11/2019 1433   RDW 12.9 02/11/2019 1433   RDW 12.4 02/11/2018 1624   LYMPHSABS 1.5 02/11/2019 1433   LYMPHSABS 1.8 02/11/2018 1624   MONOABS 0.5 02/11/2019 1433   EOSABS 0.0 02/11/2019 1433   EOSABS 0.0 02/11/2018 1624   BASOSABS 0.0 02/11/2019 1433   BASOSABS 0.0 02/11/2018 1624    No results found for: POCLITH, LITHIUM   No results found for: PHENYTOIN, PHENOBARB, VALPROATE, CBMZ   .res Assessment: Plan:     Plan:  PDMP reviewed  1. Continue Wellbutrin XL 300mg  daily. 2. Continue Adderall 20mg  - 1/2 tab bid  111/71 - 85   Completed [facility] ADHD testing 47/58 - ADHD likely   Meets DSM diagnostic criteria for ADHD diagnosis  RTC 4 weeks  Patient advised to contact office with any questions, adverse effects, or acute worsening in signs and symptoms.  Discussed potential benefits, risks, and side effects of stimulants with patient to include increased heart rate, palpitations, insomnia, increased anxiety, increased irritability, or decreased appetite.  Instructed patient to contact office if experiencing any significant tolerability issues.    Diagnoses and all orders for this visit:  Generalized anxiety disorder -     buPROPion (WELLBUTRIN XL) 300 MG 24 hr tablet; Take 1 tablet (300 mg total) by mouth daily.  Major depressive disorder, recurrent episode, moderate (HCC) -     buPROPion (WELLBUTRIN XL) 300 MG 24 hr tablet; Take 1 tablet (300 mg total)  by mouth daily.  Attention deficit hyperactivity disorder (ADHD), unspecified ADHD type -      amphetamine-dextroamphetamine (ADDERALL) 20 MG tablet; Take 1 tablet (20 mg total) by mouth daily. -     amphetamine-dextroamphetamine (ADDERALL) 20 MG tablet; Take 1 tablet (20 mg total) by mouth daily. -     amphetamine-dextroamphetamine (ADDERALL) 20 MG tablet; Take 1 tablet (20 mg total) by mouth daily.     Please see After Visit Summary for patient specific instructions.  Future Appointments  Date Time Provider Department Center  10/27/2020  4:40 PM Quilla Freeze, Thereasa Solo, NP [facility] None    No orders of the defined types were placed in this encounter.   -------------------------------

## 2020-09-14 ENCOUNTER — Ambulatory Visit: Payer: 59 | Admitting: Physician Assistant

## 2020-09-15 ENCOUNTER — Ambulatory Visit: Payer: 59 | Admitting: Family

## 2020-09-26 ENCOUNTER — Ambulatory Visit (INDEPENDENT_AMBULATORY_CARE_PROVIDER_SITE_OTHER): Payer: 59 | Admitting: Family

## 2020-09-26 ENCOUNTER — Encounter: Payer: Self-pay | Admitting: Family

## 2020-09-26 ENCOUNTER — Other Ambulatory Visit: Payer: Self-pay

## 2020-09-26 VITALS — BP 110/70 | HR 87 | Temp 98.0°F | Ht 64.0 in | Wt 179.0 lb

## 2020-09-26 DIAGNOSIS — B351 Tinea unguium: Secondary | ICD-10-CM | POA: Diagnosis not present

## 2020-09-26 MED ORDER — CICLOPIROX 8 % EX SOLN: Freq: Every day | CUTANEOUS | 2 refills | Status: DC

## 2020-09-26 NOTE — Patient Instructions (Signed)
Fungal Nail Infection A fungal nail infection is a common infection of the toenails or fingernails. This condition affects toenails more often than fingernails. It often affects the great, or big, toes. More than one nail may be infected. The condition can be passed from person to person (is contagious). What are the causes? This condition is caused by a fungus. Several types of fungi can cause the infection. These fungi are common in moist and warm areas. If your hands or feet come into contact with the fungus, it may get into a crack in your fingernail or toenail and cause the infection. What increases the risk? The following factors may make you more likely to develop this condition:  Being female.  Being of older age.  Living with someone who has the fungus.  Walking barefoot in areas where the fungus thrives, such as showers or locker rooms.  Wearing shoes and socks that cause your feet to sweat.  Having a nail injury or a recent nail surgery.  Having certain medical conditions, such as: ? Athlete's foot. ? Diabetes. ? Psoriasis. ? Poor circulation. ? A weak body defense system (immune system). What are the signs or symptoms? Symptoms of this condition include:  A pale spot on the nail.  Thickening of the nail.  A nail that becomes yellow or brown.  A brittle or ragged nail edge.  A crumbling nail.  A nail that has lifted away from the nail bed.   How is this diagnosed? This condition is diagnosed with a physical exam. Your health care provider may take a scraping or clipping from your nail to test for the fungus. How is this treated? Treatment is not needed for mild infections. If you have significant nail changes, treatment may include:  Antifungal medicines taken by mouth (orally). You may need to take the medicine for several weeks or several months, and you may not see the results for a long time. These medicines can cause side effects. Ask your health care  provider what problems to watch for.  Antifungal nail polish or nail cream. These may be used along with oral antifungal medicines.  Laser treatment of the nail.  Surgery to remove the nail. This may be needed for the most severe infections. It can take a long time, usually up to a year, for the infection to go away. The infection may also come back.   Follow these instructions at home: Medicines  Take or apply over-the-counter and prescription medicines only as told by your health care provider.  Ask your health care provider about using over-the-counter mentholated ointment on your nails. Nail care  Trim your nails often.  Wash and dry your hands and feet every day.  Keep your feet dry: ? Wear absorbent socks, and change your socks frequently. ? Wear shoes that allow air to circulate, such as sandals or canvas tennis shoes. Throw out old shoes.  Do not use artificial nails.  If you go to a nail salon, make sure you choose one that uses clean instruments.  Use antifungal foot powder on your feet and in your shoes. General instructions  Do not share personal items, such as towels or nail clippers.  Do not walk barefoot in shower rooms or locker rooms.  Wear rubber gloves if you are working with your hands in wet areas.  Keep all follow-up visits as told by your health care provider. This is important. Contact a health care provider if: Your infection is not getting better or   it is getting worse after several months. Summary  A fungal nail infection is a common infection of the toenails or fingernails.  Treatment is not needed for mild infections. If you have significant nail changes, treatment may include taking medicine orally and applying medicine to your nails.  It can take a long time, usually up to a year, for the infection to go away. The infection may also come back.  Take or apply over-the-counter and prescription medicines only as told by your health care  provider.  Follow instructions for taking care of your nails to help prevent infection from coming back or spreading. This information is not intended to replace advice given to you by your health care provider. Make sure you discuss any questions you have with your health care provider. Document Revised: 08/14/2018 Document Reviewed: 09/27/2017 Elsevier Patient Education  2021 Elsevier Inc.  

## 2020-09-26 NOTE — Progress Notes (Signed)
Acute Office Visit  Subjective:    Patient ID: Briana Reed, female    DOB: 11-Aug-1985, 35 y.o.   MRN: [id number]  Chief Complaint  Patient presents with  . fingernail problem    Pt says both thumb nails and right index finger nails are seperating from the skin. Nails are discolored.    HPI Patient is in today with c/o right thumb nails coming up from the nailbed and nails turning yellow. She reports getting SNS put on her nails. No pain but reports irritation around the nailbed. Has been applying vitamin E oil without relief.   Past Medical History:  Diagnosis Date  . Anemia   . Anxiety   . Depression   . GERD (gastroesophageal reflux disease)   . History of Papanicolaou smear of cervix 10/28/12; 08/02/15   NEG; LGSIL, HPV  . Immunization, viral disease    GARDASIL COMPLETED  . Vitamin D deficiency 07/2011    Past Surgical History:  Procedure Laterality Date  . ESOPHAGOGASTRODUODENOSCOPY  2008   WNL    Family History  Problem Relation Age of Onset  . Skin cancer Mother 47       MELANOMA  . Hypertension Mother   . Endometrial cancer Mother        on radiation treatments  . Uterine cancer Mother 3  . Hyperlipidemia Mother   . Lung cancer Maternal Grandmother 80  . Uterine cancer Maternal Grandmother 65  . Hypertension Father   . Stroke Father   . Hyperlipidemia Father   . Heart attack Maternal Grandfather   . Heart attack Paternal Grandmother   . Heart attack Paternal Grandfather   . Stomach cancer Neg Hx   . Colon cancer Neg Hx   . Esophageal cancer Neg Hx   . Pancreatic cancer Neg Hx     Social History   Socioeconomic History  . Marital status: Single    Spouse name: Not on file  . Number of children: 0  . Years of education: 7  . Highest education level: Associate degree: occupational, Scientist, product/process development, or vocational program  Occupational History  . Occupation: bartender     Comment: Tourist information centre manager   Tobacco Use  . Smoking status: Never Smoker  .  Smokeless tobacco: Never Used  Vaping Use  . Vaping Use: Never used  Substance and Sexual Activity  . Alcohol use: Yes    Alcohol/week: 2.0 standard drinks    Types: 2 Glasses of wine per week    Comment: social   . Drug use: No  . Sexual activity: Yes    Partners: Male    Birth control/protection: I.U.D.  Other Topics Concern  . 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 amphetamine-dextroamphetamine (ADDERALL) 20 MG tablet Take 1 tablet (20 mg total) by mouth daily. 30 tablet 0  . amphetamine-dextroamphetamine (ADDERALL) 20 MG tablet Take  1 tablet (20 mg total) by mouth daily. 30 tablet 0  . [START ON 10/13/2020] amphetamine-dextroamphetamine (ADDERALL) 20 MG tablet Take 1 tablet (20 mg total) by mouth daily. 30 tablet 0  . ascorbic acid (VITAMIN C) 500 MG tablet Take 500 mg by mouth daily.    [facility] buPROPion (WELLBUTRIN XL) 300 MG 24 hr tablet Take 1 tablet (300 mg total) by mouth daily. 30 tablet 5  . Cholecalciferol (VITAMIN D-3) 5000 UNIT/ML LIQD Place 5,000 Int'l Units/L under the tongue daily.    . Zinc 30 MG CAPS Take 1 capsule by mouth daily.    [facility] levonorgestrel (MIRENA, 52 MG,) 20 MCG/24HR IUD 1 Intra Uterine Device (1 each total) by Intrauterine route once for 1 dose. (Patient taking differently: 1 each by Intrauterine route once. Inserted 08/12/2017 by GYN, needs to be removed 08/2022.) 1 Intra Uterine Device 0   No  facility-administered medications prior to visit.    No Known Allergies  Review of Systems  Constitutional: Negative.   Respiratory: Negative.   Cardiovascular: Negative.   Skin: Positive for color change.       Thumb nails on both hands lifting and turning yellow  Psychiatric/Behavioral: Negative.   All other systems reviewed and are negative.      Objective:    Physical Exam Constitutional:      Appearance: Normal appearance.  Cardiovascular:     Rate and Rhythm: Normal rate and regular rhythm.  Pulmonary:     Effort: Pulmonary effort is normal.     Breath sounds: Normal breath sounds.  Musculoskeletal:     Cervical back: Normal range of motion and neck supple.  Skin:    General: Skin is warm and dry.     Comments: Thumb nails bilaterally are lifting from the nailbeds bilaterally. Yellow tent to the nails. Mild dermatitis noted to the fingertips.   Neurological:     General: No focal deficit present.     Mental Status: She is alert and oriented to person, place, and time.     BP 110/70 (BP Location: Left Arm, Patient Position: Sitting, Cuff Size: Normal)   Pulse 87   Temp 98 F (36.7 C) (Temporal)   Ht 5\' 4"  (1.626 m)   Wt 179 lb (81.2 kg)   SpO2 99%   BMI 30.73 kg/m  Wt Readings from Last 3 Encounters:  09/26/20 179 lb (81.2 kg)  04/27/20 181 lb 6 oz (82.3 kg)  03/22/20 174 lb (78.9 kg)    Health Maintenance Due  Topic Date Due  . PAP SMEAR-Modifier  02/04/2018    There are no preventive care reminders to display for this patient.   Lab Results  Component Value Date   TSH 0.62 03/23/2020   Lab Results  Component Value Date   WBC 7.1 02/11/2019   HGB 13.4 02/11/2019   HCT 39.7 02/11/2019   MCV 96.6 02/11/2019   PLT 319.0 02/11/2019   Lab Results  Component Value Date   NA 138 02/11/2019   K 4.1 02/11/2019   CO2 24 02/11/2019   GLUCOSE 95 02/11/2019   BUN 9 02/11/2019   CREATININE 0.62 02/11/2019   BILITOT 0.5 02/11/2019   ALKPHOS 52  02/11/2019   AST 11 02/11/2019   ALT 8 02/11/2019   PROT 7.3 02/11/2019   ALBUMIN 4.6 02/11/2019   CALCIUM 9.8 02/11/2019   GFR 111.16 02/11/2019   Lab Results  Component Value Date   CHOL 140 06/12/2017   Lab Results  Component Value  Date   HDL 71 06/12/2017   Lab Results  Component Value Date   LDLCALC 55 06/12/2017   Lab Results  Component Value Date   TRIG 60 06/12/2017   Lab Results  Component Value Date   CHOLHDL 2.0 06/12/2017   Lab Results  Component Value Date   HGBA1C 5.0 06/12/2017       Assessment & Plan:   Problem List Items Addressed This Visit   None   Visit Diagnoses    Onychomycosis    -  Primary   Relevant Medications   ciclopirox (PENLAC) 8 % solution       Meds ordered this encounter  Medications  . ciclopirox (PENLAC) 8 % solution    Sig: Apply topically at bedtime. Apply over nail and surrounding skin. Apply daily over previous coat. After seven (7) days, may remove with alcohol and continue cycle.    Dispense:  6.6 mL    Refill:  2   Advised patient that treatment will take several months, do not get frustrated. Call with questions or concerns. Will consider oral medication if necessary  Eulis Foster, FNP

## 2020-10-13 ENCOUNTER — Encounter: Payer: Self-pay | Admitting: Physician Assistant

## 2020-10-13 NOTE — Telephone Encounter (Signed)
Please see message and advise 

## 2020-10-25 ENCOUNTER — Encounter: Payer: 59 | Admitting: Family

## 2020-10-27 ENCOUNTER — Ambulatory Visit: Payer: 59 | Admitting: Adult Health

## 2020-11-24 ENCOUNTER — Ambulatory Visit: Payer: 59 | Admitting: Adult Health

## 2020-11-29 ENCOUNTER — Other Ambulatory Visit: Payer: Self-pay

## 2020-11-29 ENCOUNTER — Telehealth: Payer: Self-pay | Admitting: Adult Health

## 2020-11-29 DIAGNOSIS — F909 Attention-deficit hyperactivity disorder, unspecified type: Secondary | ICD-10-CM

## 2020-11-29 MED ORDER — AMPHETAMINE-DEXTROAMPHETAMINE 20 MG PO TABS: 20.0000 mg | ORAL_TABLET | Freq: Every day | ORAL | 0 refills | Status: DC

## 2020-11-29 NOTE — Telephone Encounter (Signed)
Next visit is 12/05/20. Requesting refill for Generic Adderall 20 mg called to:  [facility] [id number] [place], [place] - [address]  Phone:  [phone]  Fax:  [phone]

## 2020-11-29 NOTE — Telephone Encounter (Signed)
Pended.

## 2020-11-29 NOTE — Telephone Encounter (Signed)
Next visit is 12/05/20. Requesting refill on Generic Adderall 20 mg called to:

## 2020-12-05 ENCOUNTER — Ambulatory Visit: Payer: 59 | Admitting: Adult Health

## 2020-12-08 ENCOUNTER — Ambulatory Visit: Payer: 59 | Admitting: Adult Health

## 2020-12-08 ENCOUNTER — Other Ambulatory Visit: Payer: Self-pay | Admitting: Adult Health

## 2020-12-08 DIAGNOSIS — F909 Attention-deficit hyperactivity disorder, unspecified type: Secondary | ICD-10-CM

## 2020-12-08 MED ORDER — AMPHETAMINE-DEXTROAMPHETAMINE 20 MG PO TABS
20.0000 mg | ORAL_TABLET | Freq: Every day | ORAL | 0 refills | Status: DC
Start: 2020-12-08 — End: 2020-12-28

## 2020-12-08 NOTE — Progress Notes (Deleted)
Briana Reed [id number] [birth year] 35 y.o.  Subjective:   Patient ID:  Briana Reed is a 35 y.o. (DOB 1985/08/18) female.  Chief Complaint: No chief complaint on file.   HPI [place] Reed presents to the office today for follow-up of MDD, ADHD and GAD.   Describes mood today as "ok". Pleasant. Decreased tearfulness. Mood symptoms - reports decreased depression, anxiety, and irritability. Stating "I'm doing better". Building "good"  habits and routines. Doing things she likes to do - hobbies. Feels like medications continue to work well. Improved interest and motivation. Taking medications as prescribed.  Energy levels improved. Active, does not have a regular exercise routine.  Enjoys some usual interests and activities. Single. Dating - has a boyfriend of 1 year. Lives alone 3 cats. Sister and mother in [place]. Father in [place]. Talking to friends. Appetite adequate. Weight stable - 173 pounds. Decreased GI issues. Sleeps well most nights. Averages 7 to 8 hours. Focus and concentration difficulties. Working on an associates in [employer] - "taking a break". Completing tasks. Managing aspects of household. 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  Denies SI or HI. Denies AH or VH. Seeing therapist every 2 weeks - Estill Bakes.  Previous medication trials: Wellbutrin XL, Trintellix     AUDIT    Flowsheet Row Office Visit from 10/06/2018 in Primary Care at [facility]  Alcohol Use Disorder Identification Test Final Score (AUDIT) 6      GAD-7    Flowsheet Row Office Visit from 09/21/2019 in [place] [facility] [facility] Visit from 02/11/2019 in [place] [facility] from 02/11/2018 in Primary Care at [place]  Total GAD-7 Score 18 17 4       PHQ2-9    Flowsheet Row Office Visit from 09/21/2019 in [facility] [facility] from 04/17/2019 in [place] [facility] from 02/11/2019 in [place] [facility] from 01/02/2019 in Primary Care at 01/04/2019 from 12/02/2018 in Primary Care at [facility] Total Score 3 6 2  0 6  PHQ-9 Total Score 13 20 12  -- 11      Flowsheet Row ED from 07/18/2020 in [facility] RISK CATEGORY No Risk        Review of Systems:  Review of Systems  Musculoskeletal:  Negative for gait problem.  Neurological:  Negative for tremors.  Psychiatric/Behavioral:         Please refer to HPI   Medications: I have reviewed the patient's current medications.  Current Outpatient Medications  Medication Sig Dispense Refill   amphetamine-dextroamphetamine (ADDERALL) 20 MG tablet Take 1 tablet (20 mg total) by mouth daily. 30 tablet 0   amphetamine-dextroamphetamine (ADDERALL) 20 MG tablet Take 1 tablet (20 mg total) by mouth daily. 30 tablet 0   amphetamine-dextroamphetamine (ADDERALL) 20 MG tablet Take 1 tablet (20 mg total) by mouth daily. 30 tablet 0   ascorbic acid (VITAMIN C) 500 MG tablet Take 500 mg by mouth daily.     buPROPion (WELLBUTRIN XL) 300 MG 24 hr tablet Take 1 tablet (300 mg total) by mouth daily. 30 tablet 5   Cholecalciferol (VITAMIN D-3) 5000 UNIT/ML LIQD Place 5,000 Int'l Units/L under the tongue daily.     ciclopirox (PENLAC) 8 % solution Apply topically at bedtime. Apply over nail and surrounding skin. Apply daily over previous coat. After seven (7) days, may remove with alcohol and continue cycle.  6.6 mL 2   levonorgestrel (MIRENA, 52 MG,) 20 MCG/24HR IUD 1 Intra Uterine Device (1 each total) by Intrauterine route once for 1 dose. (Patient taking differently: 1 each by Intrauterine route once. Inserted 08/12/2017 by GYN, needs to be removed 08/2022.) 1 Intra Uterine Device 0   Zinc 30 MG CAPS Take 1 capsule by mouth daily.     No current facility-administered medications for this visit.    Medication Side Effects: None  Allergies: No  Known Allergies  Past Medical History:  Diagnosis Date   Anemia    Anxiety    Depression    GERD (gastroesophageal reflux disease)    History of Papanicolaou smear of cervix 10/28/12; 08/02/15   NEG; LGSIL, HPV   Immunization, viral disease    GARDASIL COMPLETED   Vitamin D deficiency 07/2011    Past Medical History, Surgical history, Social history, and Family history were reviewed and updated as appropriate.   Please see review of systems for further details on the patient's review from today.   Objective:   Physical Exam:  There were no vitals taken for this visit.  Physical Exam Constitutional:      General: She is not in acute distress. Musculoskeletal:        General: No deformity.  Neurological:     Mental Status: She is alert and oriented to person, place, and time.     Coordination: Coordination normal.  Psychiatric:        Attention and Perception: Attention and perception normal. She does not perceive auditory or visual hallucinations.        Mood and Affect: Mood normal. Mood is not anxious or depressed. Affect is not labile, blunt, angry or inappropriate.        Speech: Speech normal.        Behavior: Behavior normal.        Thought Content: Thought content normal. Thought content is not paranoid or delusional. Thought content does not include homicidal or suicidal ideation. Thought content does not include homicidal or suicidal plan.        Cognition and Memory: Cognition and memory normal.        Judgment: Judgment normal.     Comments: Insight intact    Lab Review:     Component Value Date/Time   NA 138 02/11/2019 1418   NA 141 02/11/2018 1624   K 4.1 02/11/2019 1418   CL 105 02/11/2019 1418   CO2 24 02/11/2019 1418   GLUCOSE 95 02/11/2019 1418   BUN 9 02/11/2019 1418   BUN 8 02/11/2018 1624   CREATININE 0.62 02/11/2019 1418   CREATININE 0.65 06/12/2017 1010   CALCIUM 9.8 02/11/2019 1418   PROT 7.3 02/11/2019 1418   PROT 7.2 02/11/2018 1624    ALBUMIN 4.6 02/11/2019 1418   ALBUMIN 4.5 02/11/2018 1624   AST 11 02/11/2019 1418   ALT 8 02/11/2019 1418   ALKPHOS 52 02/11/2019 1418   BILITOT 0.5 02/11/2019 1418   BILITOT 0.2 02/11/2018 1624   GFRNONAA 121 02/11/2018 1624   GFRAA 140 02/11/2018 1624       Component Value Date/Time   WBC 7.1 02/11/2019 1433   RBC 4.11 02/11/2019 1433   HGB 13.4 02/11/2019 1433   HGB 13.3 02/11/2018 1624   HCT 39.7 02/11/2019 1433   HCT 39.1 02/11/2018 1624   PLT 319.0 02/11/2019 1433   PLT 336 02/11/2018 1624   MCV 96.6 02/11/2019 1433   MCV 96 02/11/2018 1624   MCH  32.6 02/11/2018 1624   MCH 32.1 06/12/2017 1010   MCHC 33.7 02/11/2019 1433   RDW 12.9 02/11/2019 1433   RDW 12.4 02/11/2018 1624   LYMPHSABS 1.5 02/11/2019 1433   LYMPHSABS 1.8 02/11/2018 1624   MONOABS 0.5 02/11/2019 1433   EOSABS 0.0 02/11/2019 1433   EOSABS 0.0 02/11/2018 1624   BASOSABS 0.0 02/11/2019 1433   BASOSABS 0.0 02/11/2018 1624    No results found for: POCLITH, LITHIUM   No results found for: PHENYTOIN, PHENOBARB, VALPROATE, CBMZ   .res Assessment: Plan:    Plan:  PDMP reviewed  1. Continue Wellbutrin XL 300mg  daily. 2. Continue Adderall 20mg  - 1/2 tab bid  111/71 - 85   Completed [facility] ADHD testing 47/58 - ADHD likely   Meets DSM diagnostic criteria for ADHD diagnosis  RTC 4 weeks  Patient advised to contact office with any questions, adverse effects, or acute worsening in signs and symptoms.  Discussed potential benefits, risks, and side effects of stimulants with patient to include increased heart rate, palpitations, insomnia, increased anxiety, increased irritability, or decreased appetite.  Instructed patient to contact office if experiencing any significant tolerability issues. There are no diagnoses linked to this encounter.   Please see After Visit Summary for patient specific instructions.  Future Appointments  Date Time Provider Department Center  12/08/2020  4:20 PM  Raileigh Sabater, 12-08-2003, NP [facility] None    No orders of the defined types were placed in this encounter.   -------------------------------

## 2020-12-22 ENCOUNTER — Ambulatory Visit: Payer: 59 | Admitting: Adult Health

## 2020-12-28 ENCOUNTER — Other Ambulatory Visit: Payer: Self-pay

## 2020-12-28 ENCOUNTER — Encounter: Payer: Self-pay | Admitting: Adult Health

## 2020-12-28 ENCOUNTER — Ambulatory Visit (INDEPENDENT_AMBULATORY_CARE_PROVIDER_SITE_OTHER): Payer: 59 | Admitting: Adult Health

## 2020-12-28 DIAGNOSIS — F909 Attention-deficit hyperactivity disorder, unspecified type: Secondary | ICD-10-CM | POA: Diagnosis not present

## 2020-12-28 DIAGNOSIS — F331 Major depressive disorder, recurrent, moderate: Secondary | ICD-10-CM

## 2020-12-28 DIAGNOSIS — F411 Generalized anxiety disorder: Secondary | ICD-10-CM

## 2020-12-28 MED ORDER — AMPHETAMINE-DEXTROAMPHETAMINE 20 MG PO TABS: 20.0000 mg | ORAL_TABLET | Freq: Every day | ORAL | 0 refills | Status: DC

## 2020-12-28 MED ORDER — BUPROPION HCL ER (XL) 300 MG PO TB24: 300.0000 mg | ORAL_TABLET | Freq: Every day | ORAL | 5 refills | Status: DC

## 2020-12-28 NOTE — Progress Notes (Signed)
Briana Reed [id number] 1985-09-06 35 y.o.  Subjective:   Patient ID:  Briana Reed is a 35 y.o. (DOB Mar 06, 1986) female.  Chief Complaint: No chief complaint on file.   HPI Briana Reed presents to the office today for follow-up of MDD, ADHD and GAD.   Describes mood today as "ok". Pleasant. Decreased tearfulness. Mood symptoms - reports some depression, anxiety, and irritability. More irritable overall. Stating "I'm not doing as well as I was". Feels "spacey" most of the time. Days are a "blur" and all run together. Can be forgetful. Trouble remembering what she did during the day is. Questions what she has and hasn't done. Stating "I feel like my head is in the clouds". Has stopped therapy, but is planning to restart.Feels like medications are helpful. Improved interest and motivation. Taking medications as prescribed.  Energy levels improved. Active, does not have a regular exercise routine.  Enjoys some usual interests and activities. Single. Dating - has a boyfriend of 1 year. Lives alone 3 cats. Sister and mother in [place]. Father in [place]. Talking to friends. Appetite adequate. Weight stable - 173 pounds.  Sleeps well most nights. Averages 7 to 8 hours. Focus and concentration difficulties. Working on an associates in [employer]. Completing tasks. Managing aspects of household. 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  Denies SI or HI. Denies AH or VH.   Previous medication trials: Wellbutrin XL, Trintellix    AUDIT    Flowsheet Row Office Visit from 10/06/2018 in Primary Care at [facility]  Alcohol Use Disorder Identification Test Final Score (AUDIT) 6      GAD-7    Flowsheet Row Office Visit from 09/21/2019 in [place] [facility] Visit from 02/11/2019 in [place] [facility] from 02/11/2018 in Primary Care at [place]  Total GAD-7 Score 18 17 4       PHQ2-9    Flowsheet Row Office  Visit from 09/21/2019 in [place] [facility] from 04/17/2019 in [place] [facility] from 02/11/2019 in [place] [facility] from 01/02/2019 in Primary Care at 01/04/2019 from 12/02/2018 in Primary Care at [facility] Total Score 3 6 2  0 6  PHQ-9 Total Score 13 20 12  -- 11      Flowsheet Row ED from 07/18/2020 in [facility] RISK CATEGORY No Risk        Review of Systems:  Review of Systems  Musculoskeletal:  Negative for gait problem.  Neurological:  Negative for tremors.  Psychiatric/Behavioral:         Please refer to HPI   Medications: I have reviewed the patient's current medications.  Current Outpatient Medications  Medication Sig Dispense Refill   amphetamine-dextroamphetamine (ADDERALL) 20 MG tablet Take 1 tablet (20 mg total) by mouth daily. 30 tablet 0   [START ON 01/25/2021] amphetamine-dextroamphetamine (ADDERALL) 20 MG tablet Take 1 tablet (20 mg total) by mouth daily. 30 tablet 0   [START ON 02/22/2021] amphetamine-dextroamphetamine (ADDERALL) 20 MG tablet Take 1 tablet (20 mg total) by mouth daily. 30 tablet 0   ascorbic acid (VITAMIN C) 500 MG tablet Take 500 mg by mouth daily.     buPROPion (WELLBUTRIN XL) 300 MG 24 hr tablet Take 1 tablet (300 mg total) by mouth daily. 30 tablet 5   Cholecalciferol (VITAMIN D-3) 5000 UNIT/ML LIQD Place 5,000 Int'l Units/L under the tongue daily.  ciclopirox (PENLAC) 8 % solution Apply topically at bedtime. Apply over nail and surrounding skin. Apply daily over previous coat. After seven (7) days, may remove with alcohol and continue cycle. 6.6 mL 2   levonorgestrel (MIRENA, 52 MG,) 20 MCG/24HR IUD 1 Intra Uterine Device (1 each total) by Intrauterine route once for 1 dose. (Patient taking differently: 1 each by Intrauterine route once. Inserted 08/12/2017 by GYN, needs to be removed 08/2022.) 1 Intra Uterine  Device 0   Zinc 30 MG CAPS Take 1 capsule by mouth daily.     No current facility-administered medications for this visit.    Medication Side Effects: None  Allergies: No Active Allergies  Past Medical History:  Diagnosis Date   Anemia    Anxiety    Depression    GERD (gastroesophageal reflux disease)    History of Papanicolaou smear of cervix 10/28/12; 08/02/15   NEG; LGSIL, HPV   Immunization, viral disease    GARDASIL COMPLETED   Vitamin D deficiency 07/2011    Past Medical History, Surgical history, Social history, and Family history were reviewed and updated as appropriate.   Please see review of systems for further details on the patient's review from today.   Objective:   Physical Exam:  There were no vitals taken for this visit.  Physical Exam Constitutional:      General: She is not in acute distress. Musculoskeletal:        General: No deformity.  Neurological:     Mental Status: She is alert and oriented to person, place, and time.     Coordination: Coordination normal.  Psychiatric:        Attention and Perception: Attention and perception normal. She does not perceive auditory or visual hallucinations.        Mood and Affect: Mood normal. Mood is not anxious or depressed. Affect is not labile, blunt, angry or inappropriate.        Speech: Speech normal.        Behavior: Behavior normal.        Thought Content: Thought content normal. Thought content is not paranoid or delusional. Thought content does not include homicidal or suicidal ideation. Thought content does not include homicidal or suicidal plan.        Cognition and Memory: Cognition and memory normal.        Judgment: Judgment normal.     Comments: Insight intact    Lab Review:     Component Value Date/Time   NA 138 02/11/2019 1418   NA 141 02/11/2018 1624   K 4.1 02/11/2019 1418   CL 105 02/11/2019 1418   CO2 24 02/11/2019 1418   GLUCOSE 95 02/11/2019 1418   BUN 9 02/11/2019 1418   BUN  8 02/11/2018 1624   CREATININE 0.62 02/11/2019 1418   CREATININE 0.65 06/12/2017 1010   CALCIUM 9.8 02/11/2019 1418   PROT 7.3 02/11/2019 1418   PROT 7.2 02/11/2018 1624   ALBUMIN 4.6 02/11/2019 1418   ALBUMIN 4.5 02/11/2018 1624   AST 11 02/11/2019 1418   ALT 8 02/11/2019 1418   ALKPHOS 52 02/11/2019 1418   BILITOT 0.5 02/11/2019 1418   BILITOT 0.2 02/11/2018 1624   GFRNONAA 121 02/11/2018 1624   GFRAA 140 02/11/2018 1624       Component Value Date/Time   WBC 7.1 02/11/2019 1433   RBC 4.11 02/11/2019 1433   HGB 13.4 02/11/2019 1433   HGB 13.3 02/11/2018 1624   HCT 39.7 02/11/2019 1433  HCT 39.1 02/11/2018 1624   PLT 319.0 02/11/2019 1433   PLT 336 02/11/2018 1624   MCV 96.6 02/11/2019 1433   MCV 96 02/11/2018 1624   MCH 32.6 02/11/2018 1624   MCH 32.1 06/12/2017 1010   MCHC 33.7 02/11/2019 1433   RDW 12.9 02/11/2019 1433   RDW 12.4 02/11/2018 1624   LYMPHSABS 1.5 02/11/2019 1433   LYMPHSABS 1.8 02/11/2018 1624   MONOABS 0.5 02/11/2019 1433   EOSABS 0.0 02/11/2019 1433   EOSABS 0.0 02/11/2018 1624   BASOSABS 0.0 02/11/2019 1433   BASOSABS 0.0 02/11/2018 1624    No results found for: POCLITH, LITHIUM   No results found for: PHENYTOIN, PHENOBARB, VALPROATE, CBMZ   .res Assessment: Plan:    Plan:  PDMP reviewed  1. Continue Wellbutrin XL 300mg  daily. 2. Continue Adderall 20mg  - 1/2 tab bid  111/79/75  Completed [facility] ADHD testing 47/58 - ADHD likely   Meets DSM diagnostic criteria for ADHD diagnosis  RTC 4 weeks  Patient advised to contact office with any questions, adverse effects, or acute worsening in signs and symptoms.  Discussed potential benefits, risks, and side effects of stimulants with patient to include increased heart rate, palpitations, insomnia, increased anxiety, increased irritability, or decreased appetite.  Instructed patient to contact office if experiencing any significant tolerability issues. There are no diagnoses linked  to this encounter.   Diagnoses and all orders for this visit:  Attention deficit hyperactivity disorder (ADHD), unspecified ADHD type -     amphetamine-dextroamphetamine (ADDERALL) 20 MG tablet; Take 1 tablet (20 mg total) by mouth daily. -     amphetamine-dextroamphetamine (ADDERALL) 20 MG tablet; Take 1 tablet (20 mg total) by mouth daily. -     amphetamine-dextroamphetamine (ADDERALL) 20 MG tablet; Take 1 tablet (20 mg total) by mouth daily.  Generalized anxiety disorder -     buPROPion (WELLBUTRIN XL) 300 MG 24 hr tablet; Take 1 tablet (300 mg total) by mouth daily.  Major depressive disorder, recurrent episode, moderate (HCC) -     buPROPion (WELLBUTRIN XL) 300 MG 24 hr tablet; Take 1 tablet (300 mg total) by mouth daily.    Please see After Visit Summary for patient specific instructions.  Future Appointments  Date Time Provider Department Center  04/10/2021  3:40 PM Jayliani Wanner, 12-08-2003, NP CP-CP None    No orders of the defined types were placed in this encounter.   -------------------------------

## 2021-04-10 ENCOUNTER — Encounter: Payer: Self-pay | Admitting: Adult Health

## 2021-04-10 ENCOUNTER — Other Ambulatory Visit: Payer: Self-pay

## 2021-04-10 ENCOUNTER — Ambulatory Visit (INDEPENDENT_AMBULATORY_CARE_PROVIDER_SITE_OTHER): Payer: 59 | Admitting: Adult Health

## 2021-04-10 DIAGNOSIS — F411 Generalized anxiety disorder: Secondary | ICD-10-CM | POA: Diagnosis not present

## 2021-04-10 DIAGNOSIS — F909 Attention-deficit hyperactivity disorder, unspecified type: Secondary | ICD-10-CM | POA: Diagnosis not present

## 2021-04-10 DIAGNOSIS — F331 Major depressive disorder, recurrent, moderate: Secondary | ICD-10-CM

## 2021-04-10 MED ORDER — AMPHETAMINE-DEXTROAMPHETAMINE 20 MG PO TABS
20.0000 mg | ORAL_TABLET | Freq: Every day | ORAL | 0 refills | Status: DC
Start: 2021-04-10 — End: 2021-06-22

## 2021-04-10 MED ORDER — AMPHETAMINE-DEXTROAMPHETAMINE 20 MG PO TABS: 20.0000 mg | ORAL_TABLET | Freq: Every day | ORAL | 0 refills | Status: DC

## 2021-04-10 MED ORDER — AMPHETAMINE-DEXTROAMPHETAMINE 20 MG PO TABS
20.0000 mg | ORAL_TABLET | Freq: Every day | ORAL | 0 refills | Status: DC
Start: 2021-05-08 — End: 2021-06-21

## 2021-04-10 MED ORDER — BUPROPION HCL ER (XL) 300 MG PO TB24: 300.0000 mg | ORAL_TABLET | Freq: Every day | ORAL | 5 refills | Status: DC

## 2021-04-10 NOTE — Progress Notes (Signed)
Briana Reed [id number] 1986/01/31 35 y.o.  Subjective:   Patient ID:  Briana Reed is a 35 y.o. (DOB 04/24/1986) female.  Chief Complaint: No chief complaint on file.   HPI Briana Reed presents to the office today for follow-up of MDD, ADHD and GAD.   Describes mood today as "ok". Pleasant. Decreased tearfulness. Mood symptoms - reports some depression, anxiety, and irritability. Stating "I'm doing ok". Feels like medications are helpful. Improved interest and motivation. Taking medications as prescribed.  Energy levels improved. Active, does not have a regular exercise routine.  Enjoys some usual interests and activities. Dating - has a boyfriend 2 years. Lives alone 3 cats and a bearded dragon. Sister and mother in [place]. Father in [place]. Talking to friends. Appetite adequate. Weight stable - 175 pounds.  Sleeps well most nights. Averages 7 to 8 hours. Focus and concentration difficulties - feels "spacey" at times. Completing tasks. Managing aspects of household. 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  Denies SI or HI. Denies AH or VH.   Previous medication trials: Wellbutrin XL, Trintellix   AUDIT    Flowsheet Row Office Visit from 10/06/2018 in Primary Care at [facility]  Alcohol Use Disorder Identification Test Final Score (AUDIT) 6      GAD-7    Flowsheet Row Office Visit from 09/21/2019 in [facility] [facility] Visit from 02/11/2019 in [facility] [employer] [facility] from 02/11/2018 in Primary Care at [facility]  Total GAD-7 Score 18 17 4       PHQ2-9    Flowsheet Row Office Visit from 09/21/2019 in [facility] from 04/17/2019 in [facility] from 02/11/2019 in [facility] from 01/02/2019 in Primary Care at 01/04/2019 from 12/02/2018 in  Primary Care at [facility] Total Score 3 6 2  0 6  PHQ-9 Total Score 13 20 12  -- 11      Flowsheet Row ED from 07/18/2020 in [facility] RISK CATEGORY No Risk        Review of Systems:  Review of Systems  Musculoskeletal:  Negative for gait problem.  Neurological:  Negative for tremors.  Psychiatric/Behavioral:         Please refer to HPI   Medications: I have reviewed the patient's current medications.  Current Outpatient Medications  Medication Sig Dispense Refill   amphetamine-dextroamphetamine (ADDERALL) 20 MG tablet Take 1 tablet (20 mg total) by mouth daily. 30 tablet 0   [START ON 05/08/2021] amphetamine-dextroamphetamine (ADDERALL) 20 MG tablet Take 1 tablet (20 mg total) by mouth daily. 30 tablet 0   [START ON 06/05/2021] amphetamine-dextroamphetamine (ADDERALL) 20 MG tablet Take 1 tablet (20 mg total) by mouth daily. 30 tablet 0   ascorbic acid (VITAMIN C) 500 MG tablet Take 500 mg by mouth daily.     buPROPion (WELLBUTRIN XL) 300 MG 24 hr tablet Take 1 tablet (300 mg total) by mouth daily. 30 tablet 5   Cholecalciferol (VITAMIN D-3) 5000 UNIT/ML LIQD Place 5,000 Int'l Units/L under the tongue daily.     ciclopirox (PENLAC) 8 % solution Apply topically at bedtime. Apply over nail and surrounding skin. Apply daily over previous coat. After seven (7) days, may remove with alcohol and continue cycle. 6.6 mL 2   levonorgestrel (MIRENA, 52 MG,) 20 MCG/24HR IUD 1 Intra Uterine Device (  1 each total) by Intrauterine route once for 1 dose. (Patient taking differently: 1 each by Intrauterine route once. Inserted 08/12/2017 by GYN, needs to be removed 08/2022.) 1 Intra Uterine Device 0   Zinc 30 MG CAPS Take 1 capsule by mouth daily.     No current facility-administered medications for this visit.    Medication Side Effects: None  Allergies: No Active Allergies  Past Medical History:  Diagnosis Date   Anemia    Anxiety    Depression    GERD  (gastroesophageal reflux disease)    History of Papanicolaou smear of cervix 10/28/12; 08/02/15   NEG; LGSIL, HPV   Immunization, viral disease    GARDASIL COMPLETED   Vitamin D deficiency 07/2011    Past Medical History, Surgical history, Social history, and Family history were reviewed and updated as appropriate.   Please see review of systems for further details on the patient's review from today.   Objective:   Physical Exam:  There were no vitals taken for this visit.  Physical Exam Constitutional:      General: She is not in acute distress. Musculoskeletal:        General: No deformity.  Neurological:     Mental Status: She is alert and oriented to person, place, and time.     Coordination: Coordination normal.  Psychiatric:        Attention and Perception: Attention and perception normal. She does not perceive auditory or visual hallucinations.        Mood and Affect: Mood normal. Mood is not anxious or depressed. Affect is not labile, blunt, angry or inappropriate.        Speech: Speech normal.        Behavior: Behavior normal.        Thought Content: Thought content normal. Thought content is not paranoid or delusional. Thought content does not include homicidal or suicidal ideation. Thought content does not include homicidal or suicidal plan.        Cognition and Memory: Cognition and memory normal.        Judgment: Judgment normal.     Comments: Insight intact    Lab Review:     Component Value Date/Time   NA 138 02/11/2019 1418   NA 141 02/11/2018 1624   K 4.1 02/11/2019 1418   CL 105 02/11/2019 1418   CO2 24 02/11/2019 1418   GLUCOSE 95 02/11/2019 1418   BUN 9 02/11/2019 1418   BUN 8 02/11/2018 1624   CREATININE 0.62 02/11/2019 1418   CREATININE 0.65 06/12/2017 1010   CALCIUM 9.8 02/11/2019 1418   PROT 7.3 02/11/2019 1418   PROT 7.2 02/11/2018 1624   ALBUMIN 4.6 02/11/2019 1418   ALBUMIN 4.5 02/11/2018 1624   AST 11 02/11/2019 1418   ALT 8 02/11/2019  1418   ALKPHOS 52 02/11/2019 1418   BILITOT 0.5 02/11/2019 1418   BILITOT 0.2 02/11/2018 1624   GFRNONAA 121 02/11/2018 1624   GFRAA 140 02/11/2018 1624       Component Value Date/Time   WBC 7.1 02/11/2019 1433   RBC 4.11 02/11/2019 1433   HGB 13.4 02/11/2019 1433   HGB 13.3 02/11/2018 1624   HCT 39.7 02/11/2019 1433   HCT 39.1 02/11/2018 1624   PLT 319.0 02/11/2019 1433   PLT 336 02/11/2018 1624   MCV 96.6 02/11/2019 1433   MCV 96 02/11/2018 1624   MCH 32.6 02/11/2018 1624   MCH 32.1 06/12/2017 1010   MCHC 33.7 02/11/2019 1433  RDW 12.9 02/11/2019 1433   RDW 12.4 02/11/2018 1624   LYMPHSABS 1.5 02/11/2019 1433   LYMPHSABS 1.8 02/11/2018 1624   MONOABS 0.5 02/11/2019 1433   EOSABS 0.0 02/11/2019 1433   EOSABS 0.0 02/11/2018 1624   BASOSABS 0.0 02/11/2019 1433   BASOSABS 0.0 02/11/2018 1624    No results found for: POCLITH, LITHIUM   No results found for: PHENYTOIN, PHENOBARB, VALPROATE, CBMZ   .res Assessment: Plan:    Plan:  PDMP reviewed  1. Continue Wellbutrin XL 300mg  daily. 2. Continue Adderall 20mg  daily  111/79/75  Completed [facility] ADHD testing 47/58 - ADHD likely   Meets DSM diagnostic criteria for ADHD diagnosis  RTC 4 weeks  Patient advised to contact office with any questions, adverse effects, or acute worsening in signs and symptoms.  Discussed potential benefits, risks, and side effects of stimulants with patient to include increased heart rate, palpitations, insomnia, increased anxiety, increased irritability, or decreased appetite.  Instructed patient to contact office if experiencing any significant tolerability issues.  Diagnoses and all orders for this visit:  Attention deficit hyperactivity disorder (ADHD), unspecified ADHD type -     amphetamine-dextroamphetamine (ADDERALL) 20 MG tablet; Take 1 tablet (20 mg total) by mouth daily. -     amphetamine-dextroamphetamine (ADDERALL) 20 MG tablet; Take 1 tablet (20 mg total) by  mouth daily. -     amphetamine-dextroamphetamine (ADDERALL) 20 MG tablet; Take 1 tablet (20 mg total) by mouth daily.  Generalized anxiety disorder -     buPROPion (WELLBUTRIN XL) 300 MG 24 hr tablet; Take 1 tablet (300 mg total) by mouth daily.  Major depressive disorder, recurrent episode, moderate (HCC) -     buPROPion (WELLBUTRIN XL) 300 MG 24 hr tablet; Take 1 tablet (300 mg total) by mouth daily.    Please see After Visit Summary for patient specific instructions.  No future appointments.  No orders of the defined types were placed in this encounter.   -------------------------------

## 2021-05-11 ENCOUNTER — Other Ambulatory Visit: Payer: Self-pay | Admitting: Adult Health

## 2021-05-11 MED ORDER — AMPHETAMINE-DEXTROAMPHETAMINE 10 MG PO TABS: 20.0000 mg | ORAL_TABLET | Freq: Every day | ORAL | 0 refills | Status: DC

## 2021-05-11 NOTE — Telephone Encounter (Signed)
Pt called and said that her pharmacy doesn't have her adderall 20 mg in stock. So she would like you to cancel that script and send a script of 10 mg instead since they have that strength in stock. Please send to the [facility] on [address]

## 2021-06-21 ENCOUNTER — Encounter: Payer: Self-pay | Admitting: Physician Assistant

## 2021-06-21 ENCOUNTER — Ambulatory Visit: Payer: 59 | Admitting: Physician Assistant

## 2021-06-21 ENCOUNTER — Other Ambulatory Visit: Payer: Self-pay

## 2021-06-21 VITALS — BP 110/80 | HR 91 | Temp 98.7°F | Ht 64.0 in | Wt 172.0 lb

## 2021-06-21 DIAGNOSIS — R002 Palpitations: Secondary | ICD-10-CM

## 2021-06-21 DIAGNOSIS — F411 Generalized anxiety disorder: Secondary | ICD-10-CM | POA: Diagnosis not present

## 2021-06-21 DIAGNOSIS — R5383 Other fatigue: Secondary | ICD-10-CM | POA: Diagnosis not present

## 2021-06-21 DIAGNOSIS — R69 Illness, unspecified: Secondary | ICD-10-CM | POA: Diagnosis not present

## 2021-06-21 NOTE — Progress Notes (Signed)
Briana Reed is a 36 y.o. female here for fatigue.  History of Present Illness:   Chief Complaint  Patient presents with   Fatigue    Pt c/o fatigue for the past month.   Dizziness   Anxiety    Pt has been feeling more anxious than usual    HPI  Fatigue [name] presents with concerns due to feelings of increased fatigue for the past month. States that she has been having increased anxiety but doesn't believe this is fully related to what she has been experiencing. In an effort to manage this she has tried not taking her adderall 10 mg too late, decreased caffeine intake, completing yoga nightly, and eating enough throughout the day. She does admit to using gummy supplements, but this is nothing different from her daily routine. At this time she is interested in re-checking her labs to make sure everything is normal.   Denies snoring, concerns for pregnancy, increased alcohol intake, or weight fluctuation.   Heart Palpitations In addition to her fatigue, pt has also been experiencing heart palpitations nightly for the past month. States that she has noticed her heart rate is normal throughout the day, but when she gets ready for bed, she begins to feels her heart race until she falls asleep. Initially she thought this to be related to her BP which led her to monitoring this regularly for a couple of weeks but all readings were WNL per patient report. Pt has a fhx of MI, HLD, HTN, Stroke, and mitral valve prolapse.   Denies CP, SOB, or LE swelling/pain.   Anxiety At this time she is compliant with taking Wellbutrin XL 300 mg daily and Adderall 10 mg twice daily. States that she finds she takes the adderall 10 mg twice daily x 4 days and then either 10 mg or none on the other 3 days of the week. She is regularly following up with Dr. Kallie Locks, behavioral health and has found her medication regimen to be extremely beneficial.   Despite this she has been experiencing increased feelings of  anxiety, similar to the anxiety she experiences when she is having family issues. During this time she is unable to pin point what could be the main cause for her increased anxiety. Denies SI/HI.   Past Medical History:  Diagnosis Date   Anemia    Anxiety    Depression    GERD (gastroesophageal reflux disease)    History of Papanicolaou smear of cervix 10/28/12; 08/02/15   NEG; LGSIL, HPV   Immunization, viral disease    GARDASIL COMPLETED   Vitamin D deficiency 07/2011     Social History   Tobacco Use   Smoking status: Never   Smokeless tobacco: Never  Vaping Use   Vaping Use: Never used  Substance Use Topics   Alcohol use: Yes    Alcohol/week: 2.0 standard drinks    Types: 2 Glasses of wine per week    Comment: social    Drug use: No    Past Surgical History:  Procedure Laterality Date   ESOPHAGOGASTRODUODENOSCOPY  2008   WNL    Family History  Problem Relation Age of Onset   Skin cancer Mother 65       MELANOMA   Hypertension Mother    Endometrial cancer Mother        on radiation treatments   Uterine cancer Mother 106   Hyperlipidemia Mother    Lung cancer Maternal Grandmother 77   Uterine cancer Maternal Grandmother  65   Hypertension Father    Stroke Father    Hyperlipidemia Father    Heart attack Maternal Grandfather    Heart attack Paternal Grandmother    Heart attack Paternal Grandfather    Stomach cancer Neg Hx    Colon cancer Neg Hx    Esophageal cancer Neg Hx    Pancreatic cancer Neg Hx     No Known Allergies  Current Medications:   Current Outpatient Medications:    amphetamine-dextroamphetamine (ADDERALL) 10 MG tablet, Take 2 tablets (20 mg total) by mouth daily with breakfast., Disp: 60 tablet, Rfl: 0   amphetamine-dextroamphetamine (ADDERALL) 20 MG tablet, Take 1 tablet (20 mg total) by mouth daily., Disp: 30 tablet, Rfl: 0   buPROPion (WELLBUTRIN XL) 300 MG 24 hr tablet, Take 1 tablet (300 mg total) by mouth daily., Disp: 30 tablet, Rfl:  5   amphetamine-dextroamphetamine (ADDERALL) 20 MG tablet, Take 1 tablet (20 mg total) by mouth daily., Disp: 30 tablet, Rfl: 0   amphetamine-dextroamphetamine (ADDERALL) 20 MG tablet, Take 1 tablet (20 mg total) by mouth daily., Disp: 30 tablet, Rfl: 0   Cholecalciferol (VITAMIN D-3) 5000 UNIT/ML LIQD, Place 5,000 Int'l Units/L under the tongue daily., Disp: , Rfl:    levonorgestrel (MIRENA, 52 MG,) 20 MCG/24HR IUD, 1 Intra Uterine Device (1 each total) by Intrauterine route once for 1 dose. (Patient taking differently: 1 each by Intrauterine route once. Inserted 08/12/2017 by GYN, needs to be removed 08/2022.), Disp: 1 Intra Uterine Device, Rfl: 0   Review of Systems:   ROS Negative unless otherwise specified per HPI. Vitals:   Vitals:   06/21/21 1541  BP: 110/80  Pulse: 91  Temp: 98.7 F (37.1 C)  TempSrc: Temporal  SpO2: 98%  Weight: 172 lb (78 kg)  Height: 5\' 4"  (1.626 m)     Body mass index is 29.52 kg/m.  Physical Exam:   Physical Exam Vitals and nursing note reviewed.  Constitutional:      General: She is not in acute distress.    Appearance: She is well-developed. She is not ill-appearing or toxic-appearing.  HENT:     Head: Normocephalic and atraumatic.     Right Ear: Tympanic membrane, ear canal and external ear normal. Tympanic membrane is not erythematous, retracted or bulging.     Left Ear: Tympanic membrane, ear canal and external ear normal. Tympanic membrane is not erythematous, retracted or bulging.     Nose: Nose normal.     Right Sinus: No maxillary sinus tenderness or frontal sinus tenderness.     Left Sinus: No maxillary sinus tenderness or frontal sinus tenderness.     Mouth/Throat:     Pharynx: Uvula midline. No posterior oropharyngeal erythema.  Eyes:     General: Lids are normal.     Conjunctiva/sclera: Conjunctivae normal.  Neck:     Trachea: Trachea normal.  Cardiovascular:     Rate and Rhythm: Normal rate and regular rhythm.     Pulses:  Normal pulses.     Heart sounds: Normal heart sounds, S1 normal and S2 normal.  Pulmonary:     Effort: Pulmonary effort is normal.     Breath sounds: Normal breath sounds. No decreased breath sounds, wheezing, rhonchi or rales.  Lymphadenopathy:     Cervical: No cervical adenopathy.  Skin:    General: Skin is warm and dry.  Neurological:     Mental Status: She is alert.     GCS: GCS eye subscore is 4. GCS verbal subscore  is 5. GCS motor subscore is 6.  Psychiatric:        Speech: Speech normal.        Behavior: Behavior normal. Behavior is cooperative.    Assessment and Plan:   Other fatigue Uncontrolled; however no red flags We will order blood work to rule out organic cause of her symptoms I did recommend that she follow-up with her psychiatrist to discuss potential medication side effects and uncontrolled symptoms If lack of improvement or any other concerns, recommend follow-up for further testing  Heart palpitations EKG tracing is personally reviewed.  EKG notes NSR.  No acute changes.  Recommend Zio patch for 3 days to monitor her symptoms and analyze her rhythm Consider referral to cardiology based on results  Anxiety state Uncontrolled Denies SI/HI Follow-up with psychiatry to discuss further management   I,Havlyn C Ratchford,acting as a scribe for Jarold Motto, PA.,have documented all relevant documentation on the behalf of Jarold Motto, PA,as directed by  Jarold Motto, PA while in the presence of Jarold Motto, [place].  I, Jarold Motto, [place], have reviewed all documentation for this visit. The documentation on 06/21/21 for the exam, diagnosis, procedures, and orders are all accurate and complete.   Jarold Motto, PA-C

## 2021-06-21 NOTE — Patient Instructions (Signed)
It was great to see you!  EKG reassuring  Blood work today  You will be contacted by cardiology regarding your heart monitor to wear for 3 days.  Keep me posted if new/worrisome symptoms  Please follow-up with Rene Kocher regarding your symptoms   Take care,  Jarold Motto PA-C

## 2021-06-22 ENCOUNTER — Encounter: Payer: Self-pay | Admitting: Adult Health

## 2021-06-22 ENCOUNTER — Ambulatory Visit (INDEPENDENT_AMBULATORY_CARE_PROVIDER_SITE_OTHER): Payer: 59 | Admitting: Adult Health

## 2021-06-22 ENCOUNTER — Ambulatory Visit (INDEPENDENT_AMBULATORY_CARE_PROVIDER_SITE_OTHER): Payer: 59

## 2021-06-22 DIAGNOSIS — F411 Generalized anxiety disorder: Secondary | ICD-10-CM

## 2021-06-22 DIAGNOSIS — R69 Illness, unspecified: Secondary | ICD-10-CM | POA: Diagnosis not present

## 2021-06-22 DIAGNOSIS — F909 Attention-deficit hyperactivity disorder, unspecified type: Secondary | ICD-10-CM

## 2021-06-22 DIAGNOSIS — R002 Palpitations: Secondary | ICD-10-CM

## 2021-06-22 DIAGNOSIS — R5383 Other fatigue: Secondary | ICD-10-CM

## 2021-06-22 DIAGNOSIS — F331 Major depressive disorder, recurrent, moderate: Secondary | ICD-10-CM

## 2021-06-22 LAB — CBC WITH DIFFERENTIAL/PLATELET
Basophils Absolute: 0.1 10*3/uL (ref 0.0–0.1)
Basophils Relative: 1.2 % (ref 0.0–3.0)
Eosinophils Absolute: 0.1 10*3/uL (ref 0.0–0.7)
Eosinophils Relative: 0.8 % (ref 0.0–5.0)
HCT: 39.1 % (ref 36.0–46.0)
Hemoglobin: 13.2 g/dL (ref 12.0–15.0)
Lymphocytes Relative: 19.6 % (ref 12.0–46.0)
Lymphs Abs: 1.5 10*3/uL (ref 0.7–4.0)
MCHC: 33.7 g/dL (ref 30.0–36.0)
MCV: 97.8 fl (ref 78.0–100.0)
Monocytes Absolute: 0.5 10*3/uL (ref 0.1–1.0)
Monocytes Relative: 7.1 % (ref 3.0–12.0)
Neutro Abs: 5.3 10*3/uL (ref 1.4–7.7)
Neutrophils Relative %: 71.3 % (ref 43.0–77.0)
Platelets: 298 10*3/uL (ref 150.0–400.0)
RBC: 4 Mil/uL (ref 3.87–5.11)
RDW: 12.9 % (ref 11.5–15.5)
WBC: 7.4 10*3/uL (ref 4.0–10.5)

## 2021-06-22 LAB — VITAMIN B12: Vitamin B-12: 213 pg/mL (ref 211–911)

## 2021-06-22 LAB — COMPREHENSIVE METABOLIC PANEL
ALT: 11 U/L (ref 0–35)
AST: 15 U/L (ref 0–37)
Albumin: 4.7 g/dL (ref 3.5–5.2)
Alkaline Phosphatase: 44 U/L (ref 39–117)
BUN: 13 mg/dL (ref 6–23)
CO2: 30 mEq/L (ref 19–32)
Calcium: 9.5 mg/dL (ref 8.4–10.5)
Chloride: 102 mEq/L (ref 96–112)
Creatinine, Ser: 0.71 mg/dL (ref 0.40–1.20)
GFR: 110.55 mL/min (ref >60.00)
Glucose, Bld: 84 mg/dL (ref 70–99)
Potassium: 4.2 mEq/L (ref 3.5–5.1)
Sodium: 136 mEq/L (ref 135–145)
Total Bilirubin: 0.6 mg/dL (ref 0.2–1.2)
Total Protein: 7.4 g/dL (ref 6.0–8.3)

## 2021-06-22 LAB — TSH: TSH: 0.66 u[IU]/mL (ref 0.35–5.50)

## 2021-06-22 LAB — VITAMIN D 25 HYDROXY (VIT D DEFICIENCY, FRACTURES): VITD: 17.57 ng/mL — ABNORMAL LOW (ref 30.00–100.00)

## 2021-06-22 MED ORDER — AMPHETAMINE-DEXTROAMPHETAMINE 20 MG PO TABS: 20.0000 mg | ORAL_TABLET | Freq: Every day | ORAL | 0 refills | Status: DC

## 2021-06-22 MED ORDER — BUPROPION HCL ER (XL) 150 MG PO TB24: 300.0000 mg | ORAL_TABLET | Freq: Every day | ORAL | 2 refills | Status: DC

## 2021-06-22 MED ORDER — PROPRANOLOL HCL 10 MG PO TABS: 10.0000 mg | ORAL_TABLET | Freq: Two times a day (BID) | ORAL | 2 refills | Status: DC

## 2021-06-22 NOTE — Addendum Note (Signed)
Addended by: Dorothyann Gibbs on: 06/22/2021 04:12 PM   Modules accepted: Orders

## 2021-06-22 NOTE — Progress Notes (Unsigned)
Enrolled for [employer] to mail a ZIO XT long term holter monitor to the patients address on file.  

## 2021-06-22 NOTE — Progress Notes (Signed)
Briana Reed [id number] 08/11/1985 36 y.o.  Subjective:   Patient ID:  Briana Reed is a 36 y.o. (DOB 03/18/1986) female.  Chief Complaint: No chief complaint on file.   HPI  Briana Reed presents to the office today for follow-up of MDD, ADHD and GAD.   Describes mood today as "ok". Pleasant. Decreased tearfulness. Mood symptoms - reports some depression - "some, but not as bad as before", anxiety - "more so lately", and irritability - "on the edge". Stating "I'm doing as well as I was". Feels like medications may be making her anxious. Stating "I'm not sure what it is". Has met with PCP and had labs drawn. Would like to decrease Wellbutrin from 339m to 1540mfor now and is willing to do a trial of Propranolol to help manage anxiety. Varying interest and motivation. Taking medications as prescribed.  Energy levels lower - has felt very fatigued. Active, does not have a regular exercise routine.  Enjoys some usual interests and activities. Dating - has a boyfriend. Lives alone with 3 cats and a bearded dragon. [place]. Appetite adequate. Weight stable - 172 pounds.  Sleeps well most nights. Averages 7 to 8 hours. Focus and concentration difficulties - Adderall is helpful. Completing tasks. Managing aspects of household. 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 Working on an associates in [employer] not in school currently. Denies SI or HI. Denies AH or VH.   Previous medication trials: Wellbutrin XL, Trintellix   AUDIT    Flow[facility]      GAD-7    Flowsheet Row Office Visit from 06/21/2021 in [facility] from 09/21/2019 in [facility] from 02/11/2019 in [facility] from 02/11/2018 [place] [facility] [place]7 Score _0 PHQ2-9    [place] from 06/21/2021 in [facility] from 09/21/2019 [place] [facility] from 04/17/2019 in [facility] from [facility]11/2019 [facility] 0  PHQ-9 Total Score _2 --      Flowsheet Row ED [facility]        Review of Systems:  Review of Systems  Musculoskeletal:  Negative for gait problem.  Neurological:  Negative for tremors.  Psychiatric/Behavioral:         Please refer to HPI   Medications: I have reviewed the patient's current medications.  Current Outpatient Medications  Medication Sig Dispense Refill   propranolol (INDERAL) 10 MG tablet Take 1 tablet (10 mg total) by mouth 2 (two) times daily. 60 tablet 2   amphetamine-dextroamphetamine (ADDERALL) 10 MG tablet Take 2 tablets (20 mg total) by mouth daily with breakfast. 60 tablet 0   amphetamine-dextroamphetamine (ADDERALL) 20 MG tablet Take 1 tablet (20 mg total) by mouth daily. 30 tablet 0   buPROPion (WELLBUTRIN XL) 150 MG 24 hr tablet Take 2 tablets (300 mg total) by mouth daily. 30 tablet 2   levonorgestrel (MIRENA, 52  MG,) 20 MCG/24HR IUD 1 Intra Uterine Device (1 each total) by Intrauterine route once for 1 dose. (Patient taking differently: 1 each by Intrauterine route once. Inserted 08/12/2017 by GYN, needs to be removed 08/2022.) 1 Intra Uterine Device 0   No current facility-administered medications for this visit.    Medication Side Effects: None  Allergies: No Known Allergies  Past Medical History:  Diagnosis Date   Anemia    Anxiety    Depression    GERD (gastroesophageal reflux disease)    History of  Papanicolaou smear of cervix 10/28/12; 08/02/15   NEG; LGSIL, HPV   Immunization, viral disease    GARDASIL COMPLETED   Vitamin D deficiency 07/2011    Past Medical History, Surgical history, Social history, and Family history were reviewed and updated as appropriate.   Please see review of systems for further details on the patient's review from today.   Objective:   Physical Exam:  There were no vitals taken for this visit.  Physical Exam Constitutional:      General: She is not in acute distress. Musculoskeletal:        General: No deformity.  Neurological:     Mental Status: She is alert and oriented to person, place, and time.     Coordination: Coordination normal.  Psychiatric:        Attention and Perception: Attention and perception normal. She does not perceive auditory or visual hallucinations.        Mood and Affect: Mood normal. Mood is not anxious or depressed. Affect is not labile, blunt, angry or inappropriate.        Speech: Speech normal.        Behavior: Behavior normal.        Thought Content: Thought content normal. Thought content is not paranoid or delusional. Thought content does not include homicidal or suicidal ideation. Thought content does not include homicidal or suicidal plan.        Cognition and Memory: Cognition and memory normal.        Judgment: Judgment normal.     Comments: Insight intact    Lab Review:     Component Value Date/Time   NA 136 06/21/2021 1631   NA 141 02/11/2018 1624   K 4.2 06/21/2021 1631   CL 102 06/21/2021 1631   CO2 30 06/21/2021 1631   GLUCOSE 84 06/21/2021 1631   BUN 13 06/21/2021 1631   BUN 8 02/11/2018 1624   CREATININE 0.71 06/21/2021 1631   CREATININE 0.65 06/12/2017 1010   CALCIUM 9.5 06/21/2021 1631   PROT 7.4 06/21/2021 1631   PROT 7.2 02/11/2018 1624   ALBUMIN 4.7 06/21/2021 1631   ALBUMIN 4.5 02/11/2018 1624   AST 15 06/21/2021 1631   ALT 11 06/21/2021 1631   ALKPHOS 44 06/21/2021 1631   BILITOT  0.6 06/21/2021 1631   BILITOT 0.2 02/11/2018 1624   GFRNONAA 121 02/11/2018 1624   GFRAA 140 02/11/2018 1624       Component Value Date/Time   WBC 7.4 06/21/2021 1631   RBC 4.00 06/21/2021 1631   HGB 13.2 06/21/2021 1631   HGB 13.3 02/11/2018 1624   HCT 39.1 06/21/2021 1631   HCT 39.1 02/11/2018 1624   PLT 298.0 06/21/2021 1631   PLT 336 02/11/2018 1624   MCV 97.8 06/21/2021 1631   MCV 96 02/11/2018 1624   MCH 32.6 02/11/2018 1624   MCH 32.1 06/12/2017 1010   MCHC 33.7 06/21/2021 1631   RDW 12.9 06/21/2021 1631  RDW 12.4 02/11/2018 1624   LYMPHSABS 1.5 06/21/2021 1631   LYMPHSABS 1.8 02/11/2018 1624   MONOABS 0.5 06/21/2021 1631   EOSABS 0.1 06/21/2021 1631   EOSABS 0.0 02/11/2018 1624   BASOSABS 0.1 06/21/2021 1631   BASOSABS 0.0 02/11/2018 1624    No results found for: POCLITH, LITHIUM   No results found for: PHENYTOIN, PHENOBARB, VALPROATE, CBMZ   .res Assessment: Plan:    Plan:  PDMP reviewed  1. Continue Wellbutrin XL 364m daily. 2. Continue Adderall 259mdaily 3. Add Propranolol 1077mID  115/79/76   Time spent with patient was 25 minutes. Greater than 50% of face to face time with patient was spent on counseling and coordination of care.    Completed [facility] ADHD testing 47/58 - ADHD likely   Meets DSM diagnostic criteria for ADHD diagnosis  RTC 4 weeks  Patient advised to contact office with any questions, adverse effects, or acute worsening in signs and symptoms.  Discussed potential benefits, risks, and side effects of stimulants with patient to include increased heart rate, palpitations, insomnia, increased anxiety, increased irritability, or decreased appetite.  Instructed patient to contact office if experiencing any significant tolerability issues. Diagnoses and all orders for this visit:  Generalized anxiety disorder -     buPROPion (WELLBUTRIN XL) 150 MG 24 hr tablet; Take 2 tablets (300 mg total) by mouth daily. -      propranolol (INDERAL) 10 MG tablet; Take 1 tablet (10 mg total) by mouth 2 (two) times daily.  Major depressive disorder, recurrent episode, moderate (HCC) -     buPROPion (WELLBUTRIN XL) 150 MG 24 hr tablet; Take 2 tablets (300 mg total) by mouth daily.  Attention deficit hyperactivity disorder (ADHD), unspecified ADHD type     Please see After Visit Summary for patient specific instructions.  Future Appointments  Date Time Provider DepGarvin/30/2023  4:20 PM Gerome Kokesh, RegBerdie OgrenP CP-CP None    No orders of the defined types were placed in this encounter.   -------------------------------

## 2021-06-28 ENCOUNTER — Telehealth: Payer: Self-pay

## 2021-06-28 NOTE — Telephone Encounter (Signed)
States she was informed Briana Reed would send Vit D to pharmacy.  States [facility] on [address] has not received yet.  Also states Briana Reed ordered a heart monitor.  States Briana Reed informed her that she would wear it for 3 days but instructions states 10 days.   She would like clarification.

## 2021-06-29 ENCOUNTER — Ambulatory Visit: Payer: 59

## 2021-06-29 MED ORDER — VITAMIN D (ERGOCALCIFEROL) 1.25 MG (50000 UNIT) PO CAPS: 50000.0000 [IU] | ORAL_CAPSULE | ORAL | 0 refills | Status: DC

## 2021-06-29 NOTE — Telephone Encounter (Signed)
Pt called back told her I sent Rx for VIt D to the pharmacy. In regards to the heart monitor Aldona Bar said wear for 3 days if have symptoms can stop, if not wear for up to 10 days to catch symptoms. Pt verbalized understanding.

## 2021-06-29 NOTE — Telephone Encounter (Signed)
Left message on voicemail to call office.  

## 2021-06-30 DIAGNOSIS — R002 Palpitations: Secondary | ICD-10-CM

## 2021-06-30 DIAGNOSIS — R5383 Other fatigue: Secondary | ICD-10-CM | POA: Diagnosis not present

## 2021-07-06 ENCOUNTER — Other Ambulatory Visit: Payer: Self-pay

## 2021-07-06 ENCOUNTER — Ambulatory Visit (INDEPENDENT_AMBULATORY_CARE_PROVIDER_SITE_OTHER): Payer: 59 | Admitting: *Deleted

## 2021-07-06 DIAGNOSIS — E538 Deficiency of other specified B group vitamins: Secondary | ICD-10-CM

## 2021-07-06 MED ORDER — CYANOCOBALAMIN 1000 MCG/ML IJ SOLN
1000.0000 ug | Freq: Once | INTRAMUSCULAR | Status: AC
  Administered 2021-07-06: 1000 ug via INTRAMUSCULAR

## 2021-07-06 NOTE — Progress Notes (Signed)
Per orders of Samantha Worley, PA, injection of B 12  given in left deltoid per patient preference by Amara Manalang S Porcha Deblanc, CMA. Patient tolerated injection well.  

## 2021-07-06 NOTE — Progress Notes (Signed)
I have reviewed the patient's encounter and agree with the documentation. ? ?Katina Degree. Jimmey Ralph, MD ?07/06/2021 3:28 PM  ? ?

## 2021-07-12 ENCOUNTER — Ambulatory Visit: Payer: 59 | Admitting: Adult Health

## 2021-07-13 DIAGNOSIS — R002 Palpitations: Secondary | ICD-10-CM | POA: Diagnosis not present

## 2021-07-13 DIAGNOSIS — R5383 Other fatigue: Secondary | ICD-10-CM | POA: Diagnosis not present

## 2021-07-17 ENCOUNTER — Encounter: Payer: Self-pay | Admitting: Physician Assistant

## 2021-07-17 NOTE — Telephone Encounter (Signed)
Please advise 

## 2021-07-18 ENCOUNTER — Other Ambulatory Visit: Payer: Self-pay

## 2021-07-18 MED ORDER — CYANOCOBALAMIN 1000 MCG/ML IJ SOLN: 1000.0000 ug | Freq: Once | INTRAMUSCULAR | 0 refills | Status: AC

## 2021-07-18 MED ORDER — "SYRINGE 25G X 1"" 3 ML MISC": 1.0000 | 0 refills | Status: DC | PRN

## 2021-07-21 ENCOUNTER — Other Ambulatory Visit: Payer: Self-pay

## 2021-07-21 MED ORDER — CYANOCOBALAMIN 1000 MCG/ML IJ SOLN: 1000.0000 ug | Freq: Once | INTRAMUSCULAR | 0 refills | Status: AC

## 2021-07-21 MED ORDER — SYRINGE 21G X 1" 12 ML MISC
1.0000 | 0 refills | Status: DC | PRN
Start: 2021-07-21 — End: 2023-01-02

## 2021-08-03 ENCOUNTER — Encounter: Payer: Self-pay | Admitting: Adult Health

## 2021-08-03 ENCOUNTER — Ambulatory Visit (INDEPENDENT_AMBULATORY_CARE_PROVIDER_SITE_OTHER): Payer: 59 | Admitting: Adult Health

## 2021-08-03 DIAGNOSIS — F909 Attention-deficit hyperactivity disorder, unspecified type: Secondary | ICD-10-CM

## 2021-08-03 DIAGNOSIS — G4700 Insomnia, unspecified: Secondary | ICD-10-CM

## 2021-08-03 DIAGNOSIS — F411 Generalized anxiety disorder: Secondary | ICD-10-CM | POA: Diagnosis not present

## 2021-08-03 DIAGNOSIS — F331 Major depressive disorder, recurrent, moderate: Secondary | ICD-10-CM | POA: Diagnosis not present

## 2021-08-03 DIAGNOSIS — R69 Illness, unspecified: Secondary | ICD-10-CM | POA: Diagnosis not present

## 2021-08-03 MED ORDER — BUPROPION HCL ER (XL) 150 MG PO TB24: ORAL_TABLET | ORAL | 5 refills | Status: DC

## 2021-08-03 MED ORDER — PROPRANOLOL HCL 10 MG PO TABS: 10.0000 mg | ORAL_TABLET | Freq: Two times a day (BID) | ORAL | 5 refills | Status: DC

## 2021-08-03 MED ORDER — CLONAZEPAM 0.5 MG PO TABS: 0.5000 mg | ORAL_TABLET | Freq: Every day | ORAL | 2 refills | Status: DC

## 2021-08-03 MED ORDER — AMPHETAMINE-DEXTROAMPHETAMINE 20 MG PO TABS: 20.0000 mg | ORAL_TABLET | Freq: Every day | ORAL | 0 refills | Status: DC

## 2021-08-03 NOTE — Progress Notes (Signed)
Briana Reed ?[id number] ?1986/04/19 ?36 y.o. ? ?Subjective:  ? ?Patient ID:  Briana Reed is a 36 y.o. (DOB Aug 17, 1985) female. ? ?Chief Complaint: No chief complaint on file. ? ? ?HPI ?Briana Reed presents to the office today for follow-up of MDD, ADHD, insomnia and GAD.  ? ?Describes mood today as "ok". Pleasant. Denies tearfulness. Mood symptoms - reports some depression - "a lot better", anxiety - "still pretty high", and irritability - "no more than usual". Stating "I'm doing better". Feels like the medication changes have been helpful. Improved interest and motivation. Taking medications as prescribed.  ?Energy levels improved. Active, does not have a regular exercise routine.  ?Enjoys some usual interests and activities. Dating - has a boyfriend. Lives alone with 3 cats and a bearded dragon. Sister and mother in [place]. Father in [place]. Talking to friends. ?Appetite adequate. Weight stable - 172 pounds.  ?Sleeps well most nights. Averages 7 to 8 hours. ?Focus and concentration stable with Adderall. Completing tasks. Managing aspects of household. 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 Working on an associates in [employer] - not in school currently. ?Denies SI or HI. ?Denies AH or VH. ? ? ?Previous medication trials: Wellbutrin XL, Trintellix ? ? ?AUDIT   ? ?[place] Office Visit from 10/06/2018 in [facility] Care at [facility]  ?Alcohol Use Disorder Identification Test Final Score (AUDIT) 6  ? ?  ? ?GAD-7   ? ?[place] Office Visit from 06/21/2021 in [place] Visit from 09/21/2019 in [place] Visit from 02/11/2019 in [place] Visit from 02/11/2018 in [place] at [place]  ?Total GAD-7 Score 14 18 17 4   ? ?  ? ?PHQ2-9   ? ?[place] Office Visit from 06/21/2021 in [place] Visit from 09/21/2019 in [place] Visit from 04/17/2019 in [place] Visit from 02/11/2019 in [place] from 01/02/2019 in [place] at [place]  ?PHQ-2 Total Score 0 3 6 2  0  ?PHQ-9 Total Score 10 13 20 12  --  ? ?  ? ?[place] ED from 07/18/2020 in [facility]  ?C-SSRS RISK CATEGORY No Risk  ? ?  ?  ? ?Review of Systems:  ?Review of Systems  ?Musculoskeletal:  Negative for gait problem.  ?Neurological:  Negative for tremors.  ?Psychiatric/Behavioral:    ?     Please refer to HPI  ? ?Medications: I have reviewed the patient's current medications. ? ?Current Outpatient Medications  ?Medication Sig Dispense Refill  ? clonazePAM (KLONOPIN) 0.5 MG tablet Take 1 tablet (0.5 mg total) by mouth at bedtime. 30 tablet 2  ? Syringe/Needle, Disp, (SYRINGE 3CC/25GX1") 25G X 1" 3 ML MISC 1 each by Does not apply route as needed. 50 each 0  ? amphetamine-dextroamphetamine (ADDERALL) 20 MG tablet Take 1 tablet (20 mg total) by mouth daily. 30 tablet 0  ? buPROPion (WELLBUTRIN XL) 150 MG 24 hr tablet Take one tablet every morning. 30 tablet 5  ? levonorgestrel (MIRENA, 52 MG,) 20 MCG/24HR IUD 1 Intra Uterine Device (1 each total) by Intrauterine route once for 1 dose. (Patient taking differently: 1 each by Intrauterine route once. Inserted 08/12/2017 by GYN, needs to be removed 08/2022.) 1 Intra Uterine Device 0  ? propranolol (INDERAL) 10 MG tablet Take 1 tablet (10 mg total) by mouth  2 (two) times daily. 60 tablet 5  ? Syringe/Needle, Disp, (SYRINGE 12CC/21GX1") 21G X 1" 12 ML MISC 1 each by Does not apply route as needed. Use one syringe per B12 injections  ?Once weekly for 3 weeks then once monthly for 3 months 10 each 0  ? Vitamin D, Ergocalciferol, (DRISDOL) 1.25 MG (50000 UNIT) CAPS capsule Take 1 capsule (50,000 Units total) by mouth every 7 (seven) days. 12 capsule 0  ? ?No current facility-administered medications for this visit.  ? ? ?Medication  Side Effects: None ? ?Allergies: No Known Allergies ? ?Past Medical History:  ?Diagnosis Date  ? Anemia   ? Anxiety   ? Depression   ? GERD (gastroesophageal reflux disease)   ? History of Papanicolaou smear of cervix 10/28/12; 08/02/15  ? NEG; LGSIL, HPV  ? Immunization, viral disease   ? GARDASIL COMPLETED  ? Vitamin D deficiency 07/2011  ? ? ?Past Medical History, Surgical history, Social history, and Family history were reviewed and updated as appropriate.  ? ?Please see review of systems for further details on the patient's review from today.  ? ?Objective:  ? ?Physical Exam:  ?There were no vitals taken for this visit. ? ?Physical Exam ?Constitutional:   ?   General: She is not in acute distress. ?Musculoskeletal:     ?   General: No deformity.  ?Neurological:  ?   Mental Status: She is alert and oriented to person, place, and time.  ?   Coordination: Coordination normal.  ?Psychiatric:     ?   Attention and Perception: Attention and perception normal. She does not perceive auditory or visual hallucinations.     ?   Mood and Affect: Mood normal. Mood is not anxious or depressed. Affect is not labile, blunt, angry or inappropriate.     ?   Speech: Speech normal.     ?   Behavior: Behavior normal.     ?   Thought Content: Thought content normal. Thought content is not paranoid or delusional. Thought content does not include homicidal or suicidal ideation. Thought content does not include homicidal or suicidal plan.     ?   Cognition and Memory: Cognition and memory normal.     ?   Judgment: Judgment normal.  ?   Comments: Insight intact  ? ? ?Lab Review:  ?   ?Component Value Date/Time  ? NA 136 06/21/2021 1631  ? NA 141 02/11/2018 1624  ? K 4.2 06/21/2021 1631  ? CL 102 06/21/2021 1631  ? CO2 30 06/21/2021 1631  ? GLUCOSE 84 06/21/2021 1631  ? BUN 13 06/21/2021 1631  ? BUN 8 02/11/2018 1624  ? CREATININE 0.71 06/21/2021 1631  ? CREATININE 0.65 06/12/2017 1010  ? CALCIUM 9.5 06/21/2021 1631  ? PROT 7.4 06/21/2021  1631  ? PROT 7.2 02/11/2018 1624  ? ALBUMIN 4.7 06/21/2021 1631  ? ALBUMIN 4.5 02/11/2018 1624  ? AST 15 06/21/2021 1631  ? ALT 11 06/21/2021 1631  ? ALKPHOS 44 06/21/2021 1631  ? BILITOT 0.6 06/21/2021 1631  ? BILITOT 0.2 02/11/2018 1624  ? GFRNONAA 121 02/11/2018 1624  ? GFRAA 140 02/11/2018 1624  ? ? ?   ?Component Value Date/Time  ? WBC 7.4 06/21/2021 1631  ? RBC 4.00 06/21/2021 1631  ? HGB 13.2 06/21/2021 1631  ? HGB 13.3 02/11/2018 1624  ? HCT 39.1 06/21/2021 1631  ? HCT 39.1 02/11/2018 1624  ? PLT 298.0 06/21/2021 1631  ? PLT 336 02/11/2018 1624  ?  MCV 97.8 06/21/2021 1631  ? MCV 96 02/11/2018 1624  ? MCH 32.6 02/11/2018 1624  ? MCH 32.1 06/12/2017 1010  ? MCHC 33.7 06/21/2021 1631  ? RDW 12.9 06/21/2021 1631  ? RDW 12.4 02/11/2018 1624  ? LYMPHSABS 1.5 06/21/2021 1631  ? LYMPHSABS 1.8 02/11/2018 1624  ? MONOABS 0.5 06/21/2021 1631  ? EOSABS 0.1 06/21/2021 1631  ? EOSABS 0.0 02/11/2018 1624  ? BASOSABS 0.1 06/21/2021 1631  ? BASOSABS 0.0 02/11/2018 1624  ? ? ?No results found for: POCLITH, LITHIUM  ? ?No results found for: PHENYTOIN, PHENOBARB, VALPROATE, CBMZ  ? ?.res ?Assessment: Plan:   ? ?Plan: ? ?PDMP reviewed ? ?Wellbutrin XL150mg  daily. ?Adderall 20mg  daily ?Propranolol 10mg  BID ?Add Clonazepam 0.5mg  at hs ? ?Time spent with patient was 25 minutes. Greater than 50% of face to face time with patient was spent on counseling and coordination of care.   ? ?Completed [facility] ADHD testing 47/58 - ADHD likely  ? ?Meets DSM diagnostic criteria for ADHD diagnosis ? ?RTC 3 months ? ?Patient advised to contact office with any questions, adverse effects, or acute worsening in signs and symptoms. ? ?Discussed potential benefits, risks, and side effects of stimulants with patient to include increased heart rate, palpitations, insomnia, increased anxiety, increased irritability, or decreased appetite.  Instructed patient to contact office if experiencing any significant tolerability issues. ? ?Diagnoses and all  orders for this visit: ? ?Insomnia, unspecified type ?-     clonazePAM (KLONOPIN) 0.5 MG tablet; Take 1 tablet (0.5 mg total) by mouth at bedtime. ? ?Attention deficit hyperactivity disorder (ADHD)

## 2021-08-23 DIAGNOSIS — Z30431 Encounter for routine checking of intrauterine contraceptive device: Secondary | ICD-10-CM | POA: Diagnosis not present

## 2021-08-23 DIAGNOSIS — Z113 Encounter for screening for infections with a predominantly sexual mode of transmission: Secondary | ICD-10-CM | POA: Diagnosis not present

## 2021-08-23 DIAGNOSIS — Z8742 Personal history of other diseases of the female genital tract: Secondary | ICD-10-CM | POA: Diagnosis not present

## 2021-08-23 DIAGNOSIS — Z01419 Encounter for gynecological examination (general) (routine) without abnormal findings: Secondary | ICD-10-CM | POA: Diagnosis not present

## 2021-08-23 DIAGNOSIS — R69 Illness, unspecified: Secondary | ICD-10-CM | POA: Diagnosis not present

## 2021-10-14 ENCOUNTER — Other Ambulatory Visit: Payer: Self-pay | Admitting: Physician Assistant

## 2021-10-20 ENCOUNTER — Other Ambulatory Visit: Payer: Self-pay | Admitting: Physician Assistant

## 2021-11-02 ENCOUNTER — Ambulatory Visit (INDEPENDENT_AMBULATORY_CARE_PROVIDER_SITE_OTHER): Payer: Self-pay | Admitting: Adult Health

## 2021-11-02 DIAGNOSIS — Z91199 Patient's noncompliance with other medical treatment and regimen due to unspecified reason: Secondary | ICD-10-CM

## 2021-11-02 NOTE — Progress Notes (Signed)
Patient no show appointment. ? ?

## 2021-11-13 DIAGNOSIS — D1801 Hemangioma of skin and subcutaneous tissue: Secondary | ICD-10-CM | POA: Diagnosis not present

## 2021-11-13 DIAGNOSIS — L03011 Cellulitis of right finger: Secondary | ICD-10-CM | POA: Diagnosis not present

## 2021-11-13 DIAGNOSIS — D2271 Melanocytic nevi of right lower limb, including hip: Secondary | ICD-10-CM | POA: Diagnosis not present

## 2021-11-13 DIAGNOSIS — D225 Melanocytic nevi of trunk: Secondary | ICD-10-CM | POA: Diagnosis not present

## 2021-11-13 DIAGNOSIS — D2239 Melanocytic nevi of other parts of face: Secondary | ICD-10-CM | POA: Diagnosis not present

## 2021-11-13 DIAGNOSIS — L918 Other hypertrophic disorders of the skin: Secondary | ICD-10-CM | POA: Diagnosis not present

## 2021-11-13 DIAGNOSIS — L821 Other seborrheic keratosis: Secondary | ICD-10-CM | POA: Diagnosis not present

## 2021-12-07 ENCOUNTER — Other Ambulatory Visit: Payer: Self-pay

## 2021-12-07 ENCOUNTER — Telehealth: Payer: Self-pay | Admitting: Adult Health

## 2021-12-07 DIAGNOSIS — F909 Attention-deficit hyperactivity disorder, unspecified type: Secondary | ICD-10-CM

## 2021-12-07 MED ORDER — AMPHETAMINE-DEXTROAMPHETAMINE 20 MG PO TABS: 20.0000 mg | ORAL_TABLET | Freq: Every day | ORAL | 0 refills | Status: DC

## 2021-12-07 NOTE — Telephone Encounter (Signed)
Next visit is 12/20/21. Requesting refill on Adderall 20 mg called to:  [facility] [id number] [place], [place] - [address]  Phone:  [phone]  Fax:  [phone]    Adderall is in stock at this location.

## 2021-12-07 NOTE — Telephone Encounter (Signed)
Pended.

## 2021-12-18 ENCOUNTER — Ambulatory Visit: Payer: 59 | Admitting: Adult Health

## 2021-12-20 ENCOUNTER — Ambulatory Visit: Payer: 59 | Admitting: Adult Health

## 2021-12-20 ENCOUNTER — Encounter: Payer: Self-pay | Admitting: Adult Health

## 2021-12-20 DIAGNOSIS — F411 Generalized anxiety disorder: Secondary | ICD-10-CM

## 2021-12-20 DIAGNOSIS — F909 Attention-deficit hyperactivity disorder, unspecified type: Secondary | ICD-10-CM

## 2021-12-20 DIAGNOSIS — F331 Major depressive disorder, recurrent, moderate: Secondary | ICD-10-CM

## 2021-12-20 DIAGNOSIS — G4700 Insomnia, unspecified: Secondary | ICD-10-CM | POA: Diagnosis not present

## 2021-12-20 DIAGNOSIS — R69 Illness, unspecified: Secondary | ICD-10-CM | POA: Diagnosis not present

## 2021-12-20 MED ORDER — AMPHETAMINE-DEXTROAMPHET ER 10 MG PO CP24: 10.0000 mg | ORAL_CAPSULE | Freq: Every day | ORAL | 0 refills | Status: DC

## 2021-12-20 MED ORDER — CLONAZEPAM 0.5 MG PO TABS: 0.5000 mg | ORAL_TABLET | Freq: Every day | ORAL | 2 refills | Status: DC

## 2021-12-20 MED ORDER — BUPROPION HCL ER (XL) 150 MG PO TB24: ORAL_TABLET | ORAL | 5 refills | Status: DC

## 2021-12-20 MED ORDER — PROPRANOLOL HCL 10 MG PO TABS: 10.0000 mg | ORAL_TABLET | Freq: Two times a day (BID) | ORAL | 5 refills | Status: DC

## 2021-12-20 NOTE — Progress Notes (Signed)
Briana Reed [id number] 1985-07-05 36 y.o.  Subjective:   Patient ID:  Briana Reed is a 36 y.o. (DOB May 15, 1985) female.  Chief Complaint: No chief complaint on file.   HPI Briana Reed presents to the office today for follow-up of MDD, ADHD, insomnia and GAD.   Describes mood today as "ok". Pleasant. Denies tearfulness. Mood symptoms - reports some depression - "a little bit, but nothing unmanageable". Reports anxiety - "work related". Reports irritability - "more than usual". Stating "I'm doing alright". Feels like the Adderall wears off quickly and she has a "crash". Would like to try an XR formula to help with the duration. Improved interest and motivation. Taking medications as prescribed. Energy levels improved. Active, does not have a regular exercise routine.  Enjoys some usual interests and activities. Dating - has a boyfriend. Lives alone with 3 cats and a bearded dragon. Sister and mother in [place]. Father in [place]. Talking to friends. Appetite adequate. Weight stable - 172 pounds.  Sleeps well most nights. Averages 7 to 8 hours. Focus and concentration stable with Adderall. Completing tasks. Managing aspects of household. 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 Working on an associates in [employer] - not in school currently. Denies SI or HI. Denies AH or VH. Denies self harm. Denies substance use.  Previous medication trials: Wellbutrin XL, Trintellix  AUDIT    Flowsheet Row Office Visit from 10/06/2018 in [facility] Care at [facility]  Alcohol Use Disorder Identification Test Final Score (AUDIT) 6      GAD-7    Flowsheet Row Office Visit from 06/21/2021 in [facility] from 09/21/2019 in [facility] from 02/11/2019 in [facility] from 02/11/2018 in Primary Care at [place]  Total GAD-7 Score 14 18 17 4       PHQ2-9     Flowsheet Row Office Visit from 06/21/2021 in [facility] from 09/21/2019 in [facility] from 04/17/2019 in [facility] from 02/11/2019 in [facility] from 01/02/2019 in Primary Care at [facility] Total Score 0 3 6 2  0  PHQ-9 Total Score 10 13 20 12  --      Flowsheet Row ED from 07/18/2020 in [facility] RISK CATEGORY No Risk        Review of Systems:  Review of Systems  Musculoskeletal:  Negative for gait problem.  Neurological:  Negative for tremors.  Psychiatric/Behavioral:         Please refer to HPI    Medications: I have reviewed the patient's current medications.  Current Outpatient Medications  Medication Sig Dispense Refill   amphetamine-dextroamphetamine (ADDERALL XR) 10 MG 24 hr capsule Take 1 capsule (10 mg total) by mouth daily. 30 capsule 0   Syringe/Needle, Disp, (SYRINGE 3CC/25GX1") 25G X 1" 3 ML MISC 1 each by Does not apply route as needed. 50 each 0   buPROPion (WELLBUTRIN XL) 150 MG 24 hr tablet Take one tablet every morning. 30 tablet 5   clonazePAM (KLONOPIN) 0.5 MG tablet Take 1 tablet (0.5 mg total) by mouth at bedtime. 30 tablet 2   levonorgestrel (MIRENA, 52 MG,) 20 MCG/24HR IUD 1 Intra Uterine Device (1 each total) by Intrauterine route once for 1 dose. (Patient taking differently: 1 each by Intrauterine route once. Inserted 08/12/2017 by GYN,  needs to be removed 08/2022.) 1 Intra Uterine Device 0   propranolol (INDERAL) 10 MG tablet Take 1 tablet (10 mg total) by mouth 2 (two) times daily. 60 tablet 5   Syringe/Needle, Disp, (SYRINGE 12CC/21GX1") 21G X 1" 12 ML MISC 1 each by Does not apply route as needed. Use one syringe per B12 injections  Once weekly for 3 weeks then once monthly for 3 months 10 each 0   Vitamin D, Ergocalciferol, (DRISDOL) 1.25 MG (50000 UNIT) CAPS capsule  Take 1 capsule (50,000 Units total) by mouth every 7 (seven) days. 12 capsule 0   No current facility-administered medications for this visit.    Medication Side Effects: None  Allergies: No Known Allergies  Past Medical History:  Diagnosis Date   Anemia    Anxiety    Depression    GERD (gastroesophageal reflux disease)    History of Papanicolaou smear of cervix 10/28/12; 08/02/15   NEG; LGSIL, HPV   Immunization, viral disease    GARDASIL COMPLETED   Vitamin D deficiency 07/2011    Past Medical History, Surgical history, Social history, and Family history were reviewed and updated as appropriate.   Please see review of systems for further details on the patient's review from today.   Objective:   Physical Exam:  There were no vitals taken for this visit.  Physical Exam Constitutional:      General: She is not in acute distress. Musculoskeletal:        General: No deformity.  Neurological:     Mental Status: She is alert and oriented to person, place, and time.     Coordination: Coordination normal.  Psychiatric:        Attention and Perception: Attention and perception normal. She does not perceive auditory or visual hallucinations.        Mood and Affect: Mood normal. Mood is not anxious or depressed. Affect is not labile, blunt, angry or inappropriate.        Speech: Speech normal.        Behavior: Behavior normal.        Thought Content: Thought content normal. Thought content is not paranoid or delusional. Thought content does not include homicidal or suicidal ideation. Thought content does not include homicidal or suicidal plan.        Cognition and Memory: Cognition and memory normal.        Judgment: Judgment normal.     Comments: Insight intact     Lab Review:     Component Value Date/Time   NA 136 06/21/2021 1631   NA 141 02/11/2018 1624   K 4.2 06/21/2021 1631   CL 102 06/21/2021 1631   CO2 30 06/21/2021 1631   GLUCOSE 84 06/21/2021 1631   BUN 13  06/21/2021 1631   BUN 8 02/11/2018 1624   CREATININE 0.71 06/21/2021 1631   CREATININE 0.65 06/12/2017 1010   CALCIUM 9.5 06/21/2021 1631   PROT 7.4 06/21/2021 1631   PROT 7.2 02/11/2018 1624   ALBUMIN 4.7 06/21/2021 1631   ALBUMIN 4.5 02/11/2018 1624   AST 15 06/21/2021 1631   ALT 11 06/21/2021 1631   ALKPHOS 44 06/21/2021 1631   BILITOT 0.6 06/21/2021 1631   BILITOT 0.2 02/11/2018 1624   GFRNONAA 121 02/11/2018 1624   GFRAA 140 02/11/2018 1624       Component Value Date/Time   WBC 7.4 06/21/2021 1631   RBC 4.00 06/21/2021 1631   HGB 13.2 06/21/2021 1631   HGB 13.3 02/11/2018 1624  HCT 39.1 06/21/2021 1631   HCT 39.1 02/11/2018 1624   PLT 298.0 06/21/2021 1631   PLT 336 02/11/2018 1624   MCV 97.8 06/21/2021 1631   MCV 96 02/11/2018 1624   MCH 32.6 02/11/2018 1624   MCH 32.1 06/12/2017 1010   MCHC 33.7 06/21/2021 1631   RDW 12.9 06/21/2021 1631   RDW 12.4 02/11/2018 1624   LYMPHSABS 1.5 06/21/2021 1631   LYMPHSABS 1.8 02/11/2018 1624   MONOABS 0.5 06/21/2021 1631   EOSABS 0.1 06/21/2021 1631   EOSABS 0.0 02/11/2018 1624   BASOSABS 0.1 06/21/2021 1631   BASOSABS 0.0 02/11/2018 1624    No results found for: "POCLITH", "LITHIUM"   No results found for: "PHENYTOIN", "PHENOBARB", "VALPROATE", "CBMZ"   .res Assessment: Plan:    Plan:  PDMP reviewed  Wellbutrin XL150mg  daily. Propranolol 10mg  BID Add Clonazepam 0.5mg  at hs D/C Adderall 10mg  daily Add Adderall XR 10mg  daily  Time spent with patient was 25 minutes. Greater than 50% of face to face time with patient was spent on counseling and coordination of care.    BP - 116/70 - 68  Completed [facility] ADHD testing 47/58 - ADHD likely   Meets DSM diagnostic criteria for ADHD diagnosis  RTC 3 months  Patient advised to contact office with any questions, adverse effects, or acute worsening in signs and symptoms.  Discussed potential benefits, risks, and side effects of stimulants with patient to  include increased heart rate, palpitations, insomnia, increased anxiety, increased irritability, or decreased appetite.  Instructed patient to contact office if experiencing any significant tolerability issues.  Diagnoses and all orders for this visit:  Attention deficit hyperactivity disorder (ADHD), unspecified ADHD type -     amphetamine-dextroamphetamine (ADDERALL XR) 10 MG 24 hr capsule; Take 1 capsule (10 mg total) by mouth daily.  Generalized anxiety disorder -     buPROPion (WELLBUTRIN XL) 150 MG 24 hr tablet; Take one tablet every morning. -     propranolol (INDERAL) 10 MG tablet; Take 1 tablet (10 mg total) by mouth 2 (two) times daily.  Insomnia, unspecified type -     clonazePAM (KLONOPIN) 0.5 MG tablet; Take 1 tablet (0.5 mg total) by mouth at bedtime.  Major depressive disorder, recurrent episode, moderate (HCC) -     buPROPion (WELLBUTRIN XL) 150 MG 24 hr tablet; Take one tablet every morning.     Please see After Visit Summary for patient specific instructions.  No future appointments.  No orders of the defined types were placed in this encounter.   -------------------------------

## 2022-03-21 ENCOUNTER — Ambulatory Visit (INDEPENDENT_AMBULATORY_CARE_PROVIDER_SITE_OTHER): Payer: Self-pay | Admitting: Adult Health

## 2022-03-21 DIAGNOSIS — F489 Nonpsychotic mental disorder, unspecified: Secondary | ICD-10-CM

## 2022-03-21 NOTE — Progress Notes (Deleted)
[facility] MD/PA/NP Initial Note  03/21/2022 3:17 PM Briana Reed  MRN:  [id number]  Chief Complaint:   HPI: ***  Visit Diagnosis: No diagnosis found.  Past Psychiatric History:   Past Medical History:  Past Medical History:  Diagnosis Date   Anemia    Anxiety    Depression    GERD (gastroesophageal reflux disease)    History of Papanicolaou smear of cervix 10/28/12; 08/02/15   NEG; LGSIL, HPV   Immunization, viral disease    GARDASIL COMPLETED   Vitamin D deficiency 07/2011    Past Surgical History:  Procedure Laterality Date   ESOPHAGOGASTRODUODENOSCOPY  2008   WNL    Family Psychiatric History: ***  Family History:  Family History  Problem Relation Age of Onset   Skin cancer Mother 60       MELANOMA   Hypertension Mother    Endometrial cancer Mother        on radiation treatments   Uterine cancer Mother 72   Hyperlipidemia Mother    Mitral valve prolapse Mother    Hypertension Father    Stroke Father    Hyperlipidemia Father    Lung cancer Maternal Grandmother 64   Uterine cancer Maternal Grandmother 34   Heart attack Maternal Grandfather    Heart attack Paternal Grandmother    Heart attack Paternal Grandfather    Stomach cancer Neg Hx    Colon cancer Neg Hx    Esophageal cancer Neg Hx    Pancreatic cancer Neg Hx     Social History:  Social History   Socioeconomic History   Marital status: Single    Spouse name: Not on file   Number of children: 0   Years of education: 14   Highest education level: Associate degree: occupational, Scientist, product/process development, or vocational program  Occupational History   Occupation: bartender     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  Other Topics Concern   Not on file  Social History Narrative   She works as a Leisure centre manager    Worried about her mother, getting treatment for endometrial cancer   She was married previously from 2013 till 2016 - divorced since 2017   Social Determinants of Health   Financial Resource Strain: Low Risk  (06/12/2017)   Overall Financial Resource Strain (CARDIA)    Difficulty of Paying Living Expenses: Not very hard  Food Insecurity: No Food Insecurity (06/12/2017)   Hunger Vital Sign    Worried About Running Out of Food in the Last Year: Never true    Ran Out of Food in the Last Year: Never true  Transportation Needs: No Transportation Needs (06/12/2017)   PRAPARE - Administrator, Civil Service (Medical): No    Lack of Transportation (Non-Medical): No  Physical Activity: Sufficiently Active (06/12/2017)   Exercise Vital Sign    Days of Exercise per Week: 3 days    Minutes of Exercise per Session: 60 min  Stress: Stress Concern Present (06/12/2017)   [employer] of Occupational Health - Occupational Stress Questionnaire  Feeling of Stress : Very much  Social Connections: Somewhat Isolated (06/12/2017)   Social Connection and Isolation Panel [NHANES]    Frequency of Communication with Friends and Family: More than three times a week    Frequency of Social Gatherings with Friends and Family: More than three times a week    Attends Religious Services: Never    Database administrator or Organizations: Yes    Attends Engineer, structural: More than 4 times per year    Marital Status: Divorced    Allergies: No Known Allergies  Metabolic Disorder Labs: Lab Results  Component Value Date   HGBA1C 5.0 06/12/2017   MPG 97 06/12/2017   No results found for: "PROLACTIN" Lab Results  Component Value Date   CHOL 140 06/12/2017   TRIG 60 06/12/2017   HDL 71 06/12/2017   CHOLHDL 2.0 06/12/2017   LDLCALC 55 06/12/2017    Lab Results  Component Value Date   TSH 0.66 06/21/2021   TSH 0.62 03/23/2020    Therapeutic Level Labs: No results found for: "LITHIUM" No results found for: "VALPROATE" No results found for: "CBMZ"  Current Medications: Current Outpatient Medications  Medication Sig Dispense Refill   Syringe/Needle, Disp, (SYRINGE 3CC/25GX1") 25G X 1" 3 ML MISC 1 each by Does not apply route as needed. 50 each 0   amphetamine-dextroamphetamine (ADDERALL XR) 10 MG 24 hr capsule Take 1 capsule (10 mg total) by mouth daily. 30 capsule 0   buPROPion (WELLBUTRIN XL) 150 MG 24 hr tablet Take one tablet every morning. 30 tablet 5   clonazePAM (KLONOPIN) 0.5 MG tablet Take 1 tablet (0.5 mg total) by mouth at bedtime. 30 tablet 2   levonorgestrel (MIRENA, 52 MG,) 20 MCG/24HR IUD 1 Intra Uterine Device (1 each total) by Intrauterine route once for 1 dose. (Patient taking differently: 1 each by Intrauterine route once. Inserted 08/12/2017 by GYN, needs to be removed 08/2022.) 1 Intra Uterine Device 0   propranolol (INDERAL) 10 MG tablet Take 1 tablet (10 mg total) by mouth 2 (two) times daily. 60 tablet 5   Syringe/Needle, Disp, (SYRINGE 12CC/21GX1") 21G X 1" 12 ML MISC 1 each by Does not apply route as needed. Use one syringe per B12 injections  Once weekly for 3 weeks then once monthly for 3 months 10 each 0   Vitamin D, Ergocalciferol, (DRISDOL) 1.25 MG (50000 UNIT) CAPS capsule Take 1 capsule (50,000 Units total) by mouth every 7 (seven) days. 12 capsule 0   No current facility-administered medications for this visit.    Medication Side Effects: {Medication Side Effects (Optional):12147}  Orders placed this visit:  No orders of the defined types were placed in this encounter.   Psychiatric Specialty Exam:  Review of Systems  There were no vitals taken for this visit.There is no height or weight on file to calculate BMI.  General Appearance: {PSY:[id number]}  Eye Contact:  {PSY:22684}  Speech:   {PSY:[phone]}  Volume:  {PSY:22686}  Mood:  {PSY:22306}  Affect:  {[phone]}  Thought Process:  {PSY:22688}  Orientation:  {PSY:22689}  Thought Content: {PSY:22690}   Suicidal Thoughts:  {PSY:22692}  Homicidal Thoughts:  {PSY:22692}  Memory:  {PSY:415-556-6178}  Judgement:  {PSY:22694}  Insight:  {PSY:22695}  Psychomotor Activity:  {PSY:22696}  Concentration:  {PSY:21399}  Recall:  {JME:26834}  Fund of Knowledge: {PSY:22877}  Language: {HDQ:22297}  Assets:  {LGX:21194}  ADL's:  {PSY:22290}  Cognition: {RDE:[id number]}  Prognosis:  {PSY:22877}   Screenings:  AUDIT    Garment/textile technologist  Visit from 10/06/2018 in [facility] Care at [facility]  Alcohol Use Disorder Identification Test Final Score (AUDIT) 6      GAD-7    Flowsheet Row Office Visit from 06/21/2021 in [facility] Visit from 09/21/2019 in [facility] from 02/11/2019 in [facility] from 02[facility]11/2018 in [facility] at [place]  Total GAD-7 Score 14 18 17 4       PHQ2-9    Flowsheet Row Office Visit from 06/21/2021 in  [facility] Visit from 09/21/2019 in [facility] from 04/17/2019 in [facility] from 02/11/2019 in [facility] from 01/02/2019 in [facility] at [facility] Total Score 0 3 6 2  0  PHQ-9 Total Score 10 13 20 12  --      Flowsheet Row ED from 07/18/2020 in [facility] RISK CATEGORY No Risk       Receiving Psychotherapy: 07/20/2020  Treatment Plan/Recommendations: ***    [facility], NP

## 2022-03-21 NOTE — Progress Notes (Signed)
Patient no show appointment. ? ?

## 2022-03-28 ENCOUNTER — Ambulatory Visit: Payer: 59 | Admitting: Adult Health

## 2022-03-28 ENCOUNTER — Encounter: Payer: Self-pay | Admitting: Adult Health

## 2022-03-28 DIAGNOSIS — F909 Attention-deficit hyperactivity disorder, unspecified type: Secondary | ICD-10-CM

## 2022-03-28 DIAGNOSIS — F411 Generalized anxiety disorder: Secondary | ICD-10-CM

## 2022-03-28 DIAGNOSIS — F331 Major depressive disorder, recurrent, moderate: Secondary | ICD-10-CM

## 2022-03-28 DIAGNOSIS — G4700 Insomnia, unspecified: Secondary | ICD-10-CM | POA: Diagnosis not present

## 2022-03-28 DIAGNOSIS — R69 Illness, unspecified: Secondary | ICD-10-CM | POA: Diagnosis not present

## 2022-03-28 MED ORDER — PROPRANOLOL HCL 10 MG PO TABS: 10.0000 mg | ORAL_TABLET | Freq: Two times a day (BID) | ORAL | 5 refills | Status: DC

## 2022-03-28 MED ORDER — CLONAZEPAM 0.5 MG PO TABS: 0.5000 mg | ORAL_TABLET | Freq: Every day | ORAL | 2 refills | Status: DC

## 2022-03-28 MED ORDER — BUPROPION HCL ER (XL) 300 MG PO TB24: ORAL_TABLET | ORAL | 5 refills | Status: DC

## 2022-03-28 MED ORDER — AMPHETAMINE-DEXTROAMPHET ER 10 MG PO CP24: 10.0000 mg | ORAL_CAPSULE | Freq: Every day | ORAL | 0 refills | Status: DC

## 2022-03-28 NOTE — Progress Notes (Signed)
Briana Reed [id number] 23-Dec-1985 36 y.o.  Subjective:   Patient ID:  Briana Reed is a 36 y.o. (DOB 08/08/1985) female.  Chief Complaint: No chief complaint on file.   HPI Briana Reed presents to the office today for follow-up of MDD, ADHD, insomnia and GAD.   Describes mood today as "ok". Pleasant. Reports increased tearfulness. Mood symptoms - reports increased depression - "feeling burnt out". Mother with recent health issues. Reports anxiety - "all the time". Reports decreased irritability - "more emotional". Stating "I'm not doing too good". Feels like the addition of Adderall XR is working better for her. Would like to increase Wellbutrin from 150 to 300mg  daily to help manage mood symptoms. Decreased interest and motivation. Taking medications as prescribed.  Energy levels lower. Active, does not have a regular exercise routine.  Enjoys some usual interests and activities. Dating - has a boyfriend. Lives alone with 3 cats and a bearded dragon. Sister and mother in [place]. Father in [place]. Talking to friends. Appetite adequate. Weight stable - 167 pounds.  Sleeps well most nights. Averages 6 to 8 hours. Focus and concentration stable - doing better with the Adderall XR. Completing tasks. Managing aspects of household. 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 Working on an associates in [employer] - not in school currently. Denies SI or HI. Denies AH or VH. Denies self harm. Denies substance use.  Previous medication trials: Wellbutrin XL, Trintellix   AUDIT    Flowsheet Row Office Visit from 10/06/2018 in Primary Care at [facility]  Alcohol Use Disorder Identification Test Final Score (AUDIT) 6      GAD-7    Flowsheet Row Office Visit from 06/21/2021 in [facility] from 09/21/2019 in [facility] from 02/11/2019 in [facility]  from 02/11/2018 in Primary Care at [place]  Total GAD-7 Score 14 18 17 4       PHQ2-9    Flowsheet Row Office Visit from 06/21/2021 in [facility] 06/23/2021 from 09/21/2019 in [facility] 09/23/2019 from 04/17/2019 in [facility] 14/03/2019 from 02/11/2019 in [facility] 04/13/2019 from 01/02/2019 in Primary Care at [facility] Total Score 0 3 6 2  0  PHQ-9 Total Score 10 13 20 12  --      Flowsheet Row ED from 07/18/2020 in [facility] RISK CATEGORY No Risk        Review of Systems:  Review of Systems  Musculoskeletal:  Negative for gait problem.  Neurological:  Negative for tremors.  Psychiatric/Behavioral:         Please refer to HPI    Medications: I have reviewed the patient's current medications.  Current Outpatient Medications  Medication Sig Dispense Refill   Syringe/Needle, Disp, (SYRINGE 3CC/25GX1") 25G X 1" 3 ML MISC 1 each by Does not apply route as needed. 50 each 0   amphetamine-dextroamphetamine (ADDERALL XR) 10 MG 24 hr capsule Take 1 capsule (10 mg total) by mouth daily. 30 capsule 0   buPROPion (WELLBUTRIN XL) 300 MG 24 hr tablet Take one tablet every morning. 30 tablet 5   clonazePAM (KLONOPIN) 0.5 MG tablet Take 1 tablet (0.5 mg total) by mouth at bedtime. 30 tablet 2   levonorgestrel (MIRENA, 52 MG,) 20 MCG/24HR IUD 1 Intra Uterine Device (1 each total) by Intrauterine route once  for 1 dose. (Patient taking differently: 1 each by Intrauterine route once. Inserted 08/12/2017 by GYN, needs to be removed 08/2022.) 1 Intra Uterine Device 0   propranolol (INDERAL) 10 MG tablet Take 1 tablet (10 mg total) by mouth 2 (two) times daily. 60 tablet 5   Syringe/Needle, Disp, (SYRINGE 12CC/21GX1") 21G X 1" 12 ML MISC 1 each by Does not apply route as needed. Use one syringe per B12 injections  Once weekly for 3 weeks then once monthly for 3  months 10 each 0   Vitamin D, Ergocalciferol, (DRISDOL) 1.25 MG (50000 UNIT) CAPS capsule Take 1 capsule (50,000 Units total) by mouth every 7 (seven) days. 12 capsule 0   No current facility-administered medications for this visit.    Medication Side Effects: None  Allergies: No Known Allergies  Past Medical History:  Diagnosis Date   Anemia    Anxiety    Depression    GERD (gastroesophageal reflux disease)    History of Papanicolaou smear of cervix 10/28/12; 08/02/15   NEG; LGSIL, HPV   Immunization, viral disease    GARDASIL COMPLETED   Vitamin D deficiency 07/2011    Past Medical History, Surgical history, Social history, and Family history were reviewed and updated as appropriate.   Please see review of systems for further details on the patient's review from today.   Objective:   Physical Exam:  There were no vitals taken for this visit.  Physical Exam Constitutional:      General: She is not in acute distress. Musculoskeletal:        General: No deformity.  Neurological:     Mental Status: She is alert and oriented to person, place, and time.     Coordination: Coordination normal.  Psychiatric:        Attention and Perception: Attention and perception normal. She does not perceive auditory or visual hallucinations.        Mood and Affect: Mood normal. Mood is not anxious or depressed. Affect is not labile, blunt, angry or inappropriate.        Speech: Speech normal.        Behavior: Behavior normal.        Thought Content: Thought content normal. Thought content is not paranoid or delusional. Thought content does not include homicidal or suicidal ideation. Thought content does not include homicidal or suicidal plan.        Cognition and Memory: Cognition and memory normal.        Judgment: Judgment normal.     Comments: Insight intact     Lab Review:     Component Value Date/Time   NA 136 06/21/2021 1631   NA 141 02/11/2018 1624   K 4.2 06/21/2021 1631    CL 102 06/21/2021 1631   CO2 30 06/21/2021 1631   GLUCOSE 84 06/21/2021 1631   BUN 13 06/21/2021 1631   BUN 8 02/11/2018 1624   CREATININE 0.71 06/21/2021 1631   CREATININE 0.65 06/12/2017 1010   CALCIUM 9.5 06/21/2021 1631   PROT 7.4 06/21/2021 1631   PROT 7.2 02/11/2018 1624   ALBUMIN 4.7 06/21/2021 1631   ALBUMIN 4.5 02/11/2018 1624   AST 15 06/21/2021 1631   ALT 11 06/21/2021 1631   ALKPHOS 44 06/21/2021 1631   BILITOT 0.6 06/21/2021 1631   BILITOT 0.2 02/11/2018 1624   GFRNONAA 121 02/11/2018 1624   GFRAA 140 02/11/2018 1624       Component Value Date/Time   WBC 7.4 06/21/2021 1631  RBC 4.00 06/21/2021 1631   HGB 13.2 06/21/2021 1631   HGB 13.3 02/11/2018 1624   HCT 39.1 06/21/2021 1631   HCT 39.1 02/11/2018 1624   PLT 298.0 06/21/2021 1631   PLT 336 02/11/2018 1624   MCV 97.8 06/21/2021 1631   MCV 96 02/11/2018 1624   MCH 32.6 02/11/2018 1624   MCH 32.1 06/12/2017 1010   MCHC 33.7 06/21/2021 1631   RDW 12.9 06/21/2021 1631   RDW 12.4 02/11/2018 1624   LYMPHSABS 1.5 06/21/2021 1631   LYMPHSABS 1.8 02/11/2018 1624   MONOABS 0.5 06/21/2021 1631   EOSABS 0.1 06/21/2021 1631   EOSABS 0.0 02/11/2018 1624   BASOSABS 0.1 06/21/2021 1631   BASOSABS 0.0 02/11/2018 1624    No results found for: "POCLITH", "LITHIUM"   No results found for: "PHENYTOIN", "PHENOBARB", "VALPROATE", "CBMZ"   .res Assessment: Plan:    Plan:  PDMP reviewed  Increase Wellbutrin XL150mg  to 300mg  daily. Propranolol 10mg  BID Clonazepam 0.5mg  at hs - 3 to 4 times a week Adderall XR 10mg  daily  Time spent with patient was 25 minutes. Greater than 50% of face to face time with patient was spent on counseling and coordination of care.    BP - 108/69/71  Completed [facility] ADHD testing 47/58 - ADHD likely   Meets DSM diagnostic criteria for ADHD diagnosis  RTC 3 months  Patient advised to contact office with any questions, adverse effects, or acute worsening in signs and  symptoms.  Discussed potential benefits, risks, and side effects of stimulants with patient to include increased heart rate, palpitations, insomnia, increased anxiety, increased irritability, or decreased appetite.  Instructed patient to contact office if experiencing any significant tolerability issues.   Please see After Visit Summary for patient specific instructions.  No future appointments.  No orders of the defined types were placed in this encounter.   -------------------------------

## 2022-05-09 ENCOUNTER — Ambulatory Visit (INDEPENDENT_AMBULATORY_CARE_PROVIDER_SITE_OTHER): Payer: Self-pay | Admitting: Adult Health

## 2022-05-09 DIAGNOSIS — F489 Nonpsychotic mental disorder, unspecified: Secondary | ICD-10-CM

## 2022-05-09 NOTE — Progress Notes (Signed)
Patient no show appointment. ? ?

## 2022-05-24 ENCOUNTER — Telehealth: Payer: Self-pay | Admitting: Adult Health

## 2022-05-24 ENCOUNTER — Other Ambulatory Visit: Payer: Self-pay

## 2022-05-24 DIAGNOSIS — F909 Attention-deficit hyperactivity disorder, unspecified type: Secondary | ICD-10-CM

## 2022-05-24 MED ORDER — AMPHETAMINE-DEXTROAMPHET ER 10 MG PO CP24: 10.0000 mg | ORAL_CAPSULE | Freq: Every day | ORAL | 0 refills | Status: DC

## 2022-05-24 NOTE — Telephone Encounter (Signed)
Patient called in for refill on Adderall XR 10mg . Ph: [facility] [place]

## 2022-05-24 NOTE — Telephone Encounter (Signed)
Pended.

## 2022-06-07 ENCOUNTER — Ambulatory Visit: Payer: 59 | Admitting: Adult Health

## 2022-06-08 ENCOUNTER — Other Ambulatory Visit: Payer: Self-pay

## 2022-06-08 ENCOUNTER — Telehealth: Payer: Self-pay | Admitting: Adult Health

## 2022-06-08 DIAGNOSIS — F411 Generalized anxiety disorder: Secondary | ICD-10-CM

## 2022-06-08 DIAGNOSIS — F331 Major depressive disorder, recurrent, moderate: Secondary | ICD-10-CM

## 2022-06-08 MED ORDER — BUPROPION HCL ER (XL) 150 MG PO TB24: ORAL_TABLET | ORAL | 0 refills | Status: DC

## 2022-06-08 NOTE — Telephone Encounter (Signed)
Pt stated since Wellbutrin has been increased her mood has been up and down and she is crying a lot.She was not having this problem on 150 mg and asking to return to that dose.

## 2022-06-08 NOTE — Telephone Encounter (Signed)
Pt called and said that she is on wellbutrin 300 mg and she has been experiencing side effects. She would like to go down to 150 mg of the wellbutrin. Please send in a new script to the [facility] on [address]

## 2022-06-08 NOTE — Telephone Encounter (Signed)
Ok to send in the 150mg  every morning.

## 2022-06-08 NOTE — Telephone Encounter (Signed)
LVM to rtc 

## 2022-06-08 NOTE — Telephone Encounter (Signed)
Rx sent 

## 2022-06-27 ENCOUNTER — Ambulatory Visit: Payer: 59 | Admitting: Adult Health

## 2022-06-27 ENCOUNTER — Encounter: Payer: Self-pay | Admitting: Adult Health

## 2022-06-27 DIAGNOSIS — F909 Attention-deficit hyperactivity disorder, unspecified type: Secondary | ICD-10-CM | POA: Diagnosis not present

## 2022-06-27 DIAGNOSIS — F411 Generalized anxiety disorder: Secondary | ICD-10-CM

## 2022-06-27 MED ORDER — AMPHETAMINE-DEXTROAMPHET ER 10 MG PO CP24: 10.0000 mg | ORAL_CAPSULE | Freq: Every day | ORAL | 0 refills | Status: DC

## 2022-06-27 MED ORDER — PROPRANOLOL HCL 10 MG PO TABS: 10.0000 mg | ORAL_TABLET | Freq: Two times a day (BID) | ORAL | 5 refills | Status: DC

## 2022-06-27 NOTE — Progress Notes (Signed)
Briana Reed IC:[id number] 03-28-1986 37 y.o.  Subjective:   Patient ID:  Briana Reed is a 37 y.o. (DOB 10-Nov-1985) female.  Chief Complaint: No chief complaint on file.   HPI Briana Reed presents to the office today for follow-up of ADHD and GAD.   Describes mood today as "ok". Pleasant. Reports increased tearfulness. Mood symptoms - denies depression and irritability. Reports work related anxiety - "manageable". Denies worry, rumination, and over thinking. Mood is consistent. Feels like medications are helpful. Would like to discontinue Wellbutrin - effecting sleep. Has stopped using Clonazepam. Mother with recent health issues - doing well. Stating "I'm doing better". Feels like the Adderall and Propranolol are working well for her. Improved interest and motivation. Taking medications as prescribed.  Energy levels pretty good. Active, does not have a regular exercise routine.  Enjoys some usual interests and activities. Dating - has a boyfriend. Lives alone with 3 cats and a bearded dragon. Sister and mother in [place]. Father in [place]. Talking to friends. Appetite adequate. Weight gain - 167 to 174 pounds.  Sleeps well most nights. Averages 7 hours. Focus and concentration stable. Completing tasks. Managing aspects of household. 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 Working on an associates in [employer] - not in school currently. Denies SI or HI. Denies AH or VH. Denies self harm. Denies substance use.  Previous medication trials: Wellbutrin XL, Trintellix   AUDIT    Flowsheet Row Office Visit from 10/06/2018 in Primary Care at [facility]  Alcohol Use Disorder Identification Test Final Score (AUDIT) 6      GAD-7    Flowsheet Row Office Visit from 06/21/2021 in [place] Visit from 09/21/2019 in [place] Visit from 02/11/2019 in [place]  Visit from 02/11/2018 in Primary Care at [place]  Total GAD-7 Score 14 18 17 4      $ PHQ2-9    [place] Visit from 06/21/2021 in [place] Visit from 09/21/2019 in [place] Visit from 04/17/2019 in [place] Visit from 02/11/2019 in [place] from 01/02/2019 in [place] [facility] Total Score 0 3 6 2 $ 0  PHQ-9 Total Score 10 13 20 12 $ --      Flowsheet Row ED from 07/18/2020 in [facility] [place] No Risk        Review of Systems:  Review of Systems  Musculoskeletal:  Negative for gait problem.  Neurological:  Negative for tremors.  Psychiatric/Behavioral:         Please refer to HPI    Medications: I have reviewed the patient's current medications.  Current Outpatient Medications  Medication Sig Dispense Refill   [START ON 07/25/2022] amphetamine-dextroamphetamine (ADDERALL XR) 10 MG 24 hr capsule Take 1 capsule (10 mg total) by mouth daily. 30 capsule 0   [START ON 08/22/2022] amphetamine-dextroamphetamine (ADDERALL XR) 10 MG 24 hr capsule Take 1 capsule (10 mg total) by mouth daily. 30 capsule 0   Syringe/Needle, Disp, (SYRINGE 3CC/25GX1") 25G X 1" 3 ML MISC 1 each by Does not apply route as needed. 50 each 0   amphetamine-dextroamphetamine (ADDERALL XR) 10 MG 24 hr capsule Take 1 capsule (10 mg total) by mouth daily. 30 capsule 0   buPROPion (WELLBUTRIN XL) 150 MG 24 hr tablet Take one tablet every  morning. 30 tablet 0   clonazePAM (KLONOPIN) 0.5 MG tablet Take 1 tablet (0.5 mg total) by mouth at bedtime. 30 tablet 2   levonorgestrel (MIRENA, 52 MG,) 20 MCG/24HR IUD 1 Intra Uterine Device (1 each total) by Intrauterine route once for 1 dose. (Patient taking differently: 1 each by Intrauterine route once. Inserted 08/12/2017 by GYN, needs to be removed 08/2022.) 1 Intra Uterine Device 0    propranolol (INDERAL) 10 MG tablet Take 1 tablet (10 mg total) by mouth 2 (two) times daily. 60 tablet 5   Syringe/Needle, Disp, (SYRINGE 12CC/21GX1") 21G X 1" 12 ML MISC 1 each by Does not apply route as needed. Use one syringe per B12 injections  Once weekly for 3 weeks then once monthly for 3 months 10 each 0   Vitamin D, Ergocalciferol, (DRISDOL) 1.25 MG (50000 UNIT) CAPS capsule Take 1 capsule (50,000 Units total) by mouth every 7 (seven) days. 12 capsule 0   No current facility-administered medications for this visit.    Medication Side Effects: None  Allergies: No Known Allergies  Past Medical History:  Diagnosis Date   Anemia    Anxiety    Depression    GERD (gastroesophageal reflux disease)    History of Papanicolaou smear of cervix 10/28/12; 08/02/15   NEG; LGSIL, HPV   Immunization, viral disease    GARDASIL COMPLETED   Vitamin D deficiency 07/2011    Past Medical History, Surgical history, Social history, and Family history were reviewed and updated as appropriate.   Please see review of systems for further details on the patient's review from today.   Objective:   Physical Exam:  There were no vitals taken for this visit.  Physical Exam Constitutional:      General: She is not in acute distress. Musculoskeletal:        General: No deformity.  Neurological:     Mental Status: She is alert and oriented to person, place, and time.     Coordination: Coordination normal.  Psychiatric:        Attention and Perception: Attention and perception normal. She does not perceive auditory or visual hallucinations.        Mood and Affect: Mood normal. Mood is not anxious or depressed. Affect is not labile, blunt, angry or inappropriate.        Speech: Speech normal.        Behavior: Behavior normal.        Thought Content: Thought content normal. Thought content is not paranoid or delusional. Thought content does not include homicidal or suicidal ideation. Thought content  does not include homicidal or suicidal plan.        Cognition and Memory: Cognition and memory normal.        Judgment: Judgment normal.     Comments: Insight intact     Lab Review:     Component Value Date/Time   NA 136 06/21/2021 1631   NA 141 02/11/2018 1624   K 4.2 06/21/2021 1631   CL 102 06/21/2021 1631   CO2 30 06/21/2021 1631   GLUCOSE 84 06/21/2021 1631   BUN 13 06/21/2021 1631   BUN 8 02/11/2018 1624   CREATININE 0.71 06/21/2021 1631   CREATININE 0.65 06/12/2017 1010   CALCIUM 9.5 06/21/2021 1631   PROT 7.4 06/21/2021 1631   PROT 7.2 02/11/2018 1624   ALBUMIN 4.7 06/21/2021 1631   ALBUMIN 4.5 02/11/2018 1624   AST 15 06/21/2021 1631   ALT 11 06/21/2021 1631   ALKPHOS  44 06/21/2021 1631   BILITOT 0.6 06/21/2021 1631   BILITOT 0.2 02/11/2018 1624   GFRNONAA 121 02/11/2018 1624   GFRAA 140 02/11/2018 1624       Component Value Date/Time   WBC 7.4 06/21/2021 1631   RBC 4.00 06/21/2021 1631   HGB 13.2 06/21/2021 1631   HGB 13.3 02/11/2018 1624   HCT 39.1 06/21/2021 1631   HCT 39.1 02/11/2018 1624   PLT 298.0 06/21/2021 1631   PLT 336 02/11/2018 1624   MCV 97.8 06/21/2021 1631   MCV 96 02/11/2018 1624   MCH 32.6 02/11/2018 1624   MCH 32.1 06/12/2017 1010   MCHC 33.7 06/21/2021 1631   RDW 12.9 06/21/2021 1631   RDW 12.4 02/11/2018 1624   LYMPHSABS 1.5 06/21/2021 1631   LYMPHSABS 1.8 02/11/2018 1624   MONOABS 0.5 06/21/2021 1631   EOSABS 0.1 06/21/2021 1631   EOSABS 0.0 02/11/2018 1624   BASOSABS 0.1 06/21/2021 1631   BASOSABS 0.0 02/11/2018 1624    No results found for: "POCLITH", "LITHIUM"   No results found for: "PHENYTOIN", "PHENOBARB", "VALPROATE", "CBMZ"   .res Assessment: Plan:    Plan:  PDMP reviewed  Discontinue Wellbutrin XL155m daily. Discontinue Clonazepam 0.595mat hs - rarely uses.  Propranolol 1035mID Adderall XR 58m66mily  Time spent with patient was 15 minutes. Greater than 50% of face to face time with patient was  spent on counseling and coordination of care.    Monitor BP between visits while taking stimulant medication.   Completed [facility] ADHD testing 47/58 - ADHD likely   Meets DSM diagnostic criteria for ADHD diagnosis  RTC 3 months  Patient advised to contact office with any questions, adverse effects, or acute worsening in signs and symptoms.  Discussed potential benefits, risks, and side effects of stimulants with patient to include increased heart rate, palpitations, insomnia, increased anxiety, increased irritability, or decreased appetite.  Instructed patient to contact office if experiencing any significant tolerability issues.  Diagnoses and all orders for this visit:  Attention deficit hyperactivity disorder (ADHD), unspecified ADHD type -     amphetamine-dextroamphetamine (ADDERALL XR) 10 MG 24 hr capsule; Take 1 capsule (10 mg total) by mouth daily. -     amphetamine-dextroamphetamine (ADDERALL XR) 10 MG 24 hr capsule; Take 1 capsule (10 mg total) by mouth daily. -     amphetamine-dextroamphetamine (ADDERALL XR) 10 MG 24 hr capsule; Take 1 capsule (10 mg total) by mouth daily.  Generalized anxiety disorder -     propranolol (INDERAL) 10 MG tablet; Take 1 tablet (10 mg total) by mouth 2 (two) times daily.     Please see After Visit Summary for patient specific instructions.  No future appointments.  No orders of the defined types were placed in this encounter.   -------------------------------

## 2022-09-20 DIAGNOSIS — Z30431 Encounter for routine checking of intrauterine contraceptive device: Secondary | ICD-10-CM | POA: Diagnosis not present

## 2022-09-20 DIAGNOSIS — Z01419 Encounter for gynecological examination (general) (routine) without abnormal findings: Secondary | ICD-10-CM | POA: Diagnosis not present

## 2022-09-20 DIAGNOSIS — L918 Other hypertrophic disorders of the skin: Secondary | ICD-10-CM | POA: Diagnosis not present

## 2022-09-20 DIAGNOSIS — Z124 Encounter for screening for malignant neoplasm of cervix: Secondary | ICD-10-CM | POA: Diagnosis not present

## 2022-09-20 DIAGNOSIS — Z113 Encounter for screening for infections with a predominantly sexual mode of transmission: Secondary | ICD-10-CM | POA: Diagnosis not present

## 2022-09-20 DIAGNOSIS — R87610 Atypical squamous cells of undetermined significance on cytologic smear of cervix (ASC-US): Secondary | ICD-10-CM | POA: Diagnosis not present

## 2022-09-20 LAB — OB RESULTS CONSOLE GC/CHLAMYDIA: Chlamydia: NEGATIVE

## 2022-09-20 LAB — HM PAP SMEAR

## 2022-09-20 LAB — RESULTS CONSOLE HPV: CHL HPV: NEGATIVE

## 2022-10-03 ENCOUNTER — Ambulatory Visit: Payer: 59 | Admitting: Adult Health

## 2022-10-03 ENCOUNTER — Encounter: Payer: Self-pay | Admitting: Adult Health

## 2022-10-03 DIAGNOSIS — G4700 Insomnia, unspecified: Secondary | ICD-10-CM | POA: Diagnosis not present

## 2022-10-03 DIAGNOSIS — F909 Attention-deficit hyperactivity disorder, unspecified type: Secondary | ICD-10-CM | POA: Diagnosis not present

## 2022-10-03 MED ORDER — AMPHETAMINE-DEXTROAMPHET ER 20 MG PO CP24: 20.0000 mg | ORAL_CAPSULE | Freq: Every day | ORAL | 0 refills | Status: DC

## 2022-10-03 NOTE — Progress Notes (Signed)
SEGEN TITUS [id number] [birth year] 37 y.o.  Subjective:   Patient ID:  Briana Reed is a 37 y.o. (DOB [birth year]) female.  Chief Complaint: No chief complaint on file.   HPI Briana Reed presents to the office today for follow-up of ADHD and GAD.   Describes mood today as "ok". Pleasant. Denies tearfulness. Mood symptoms - reports some depression, anxiety and irritability. Reports some worry, rumination, and over thinking mostly with work situation. Mood is consistent. Stating "I feel like I'm doing ok overall". Feels like medications work well for her. Improved interest and motivation. Taking medications as prescribed.  Energy levels stable. Active, does not have a regular exercise routine.  Enjoys some usual interests and activities. Dating - has a boyfriend. Lives alone with 3 cats and a bearded dragon. Sister and mother in [place]. Father in [place]. Talking to friends. Appetite adequate. Weight stable - 172 pounds.  Sleeps well most nights. Averages 7 hours. Focus and concentration difficulties - would like to increase dose of Adderall from 10mg  to 20mg . Completing tasks. Managing aspects of household. 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 Working on an associates in [employer] - not in school currently. Denies SI or HI. Denies AH or VH. Denies self harm. Denies substance use.  Previous medication trials: Wellbutrin XL, Trintellix   AUDIT    Flowsheet Row Office Visit from 10/06/2018 in Primary Care at [facility]  Alcohol Use Disorder Identification Test Final Score (AUDIT) 6      GAD-7    Flowsheet Row Office Visit from 06/21/2021 in [facility] from 09/21/2019 in [facility] from 02/11/2019 in [facility] from 02/11/2018 in Primary Care at [facility]  Total GAD-7 Score 14 18 17 4       PHQ2-9    Flowsheet Row Office Visit from  06/21/2021 in [facility] [place] from 09/21/2019 in [facility] from 04/17/2019 in [facility] from 02/11/2019 in [facility] from 01/02/2019 in Primary Care at [facility] Total Score 0 3 6 2  0  PHQ-9 Total Score 10 13 20 12  --      Flowsheet Row ED from 07/18/2020 in [facility] RISK CATEGORY No Risk        Review of Systems:  Review of Systems  Musculoskeletal:  Negative for gait problem.  Neurological:  Negative for tremors.  Psychiatric/Behavioral:         Please refer to HPI    Medications: I have reviewed the patient's current medications.  Current Outpatient Medications  Medication Sig Dispense Refill   Syringe/Needle, Disp, (SYRINGE 3CC/25GX1") 25G X 1" 3 ML MISC 1 each by Does not apply route as needed. 50 each 0   amphetamine-dextroamphetamine (ADDERALL XR) 10 MG 24 hr capsule Take 1 capsule (10 mg total) by mouth daily. 30 capsule 0   amphetamine-dextroamphetamine (ADDERALL XR) 10 MG 24 hr capsule Take 1 capsule (10 mg total) by mouth daily. 30 capsule 0   amphetamine-dextroamphetamine (ADDERALL XR) 10 MG 24 hr capsule Take 1 capsule (10 mg total) by mouth daily. 30 capsule 0   buPROPion (WELLBUTRIN XL) 150 MG 24 hr tablet Take one tablet every morning. 30 tablet 0   clonazePAM (KLONOPIN) 0.5 MG tablet Take 1 tablet (0.5 mg total) by mouth at bedtime. 30  tablet 2   levonorgestrel (MIRENA, 52 MG,) 20 MCG/24HR IUD 1 Intra Uterine Device (1 each total) by Intrauterine route once for 1 dose. (Patient taking differently: 1 each by Intrauterine route once. Inserted 08/12/2017 by GYN, needs to be removed 08/2022.) 1 Intra Uterine Device 0   propranolol (INDERAL) 10 MG tablet Take 1 tablet (10 mg total) by mouth 2 (two) times daily. 60 tablet 5   Syringe/Needle, Disp, (SYRINGE 12CC/21GX1") 21G X 1" 12 ML MISC 1  each by Does not apply route as needed. Use one syringe per B12 injections  Once weekly for 3 weeks then once monthly for 3 months 10 each 0   Vitamin D, Ergocalciferol, (DRISDOL) 1.25 MG (50000 UNIT) CAPS capsule Take 1 capsule (50,000 Units total) by mouth every 7 (seven) days. 12 capsule 0   No current facility-administered medications for this visit.    Medication Side Effects: None  Allergies: No Known Allergies  Past Medical History:  Diagnosis Date   Anemia    Anxiety    Depression    GERD (gastroesophageal reflux disease)    History of Papanicolaou smear of cervix 10/28/12; 08/02/15   NEG; LGSIL, HPV   Immunization, viral disease    GARDASIL COMPLETED   Vitamin D deficiency 07/2011    Past Medical History, Surgical history, Social history, and Family history were reviewed and updated as appropriate.   Please see review of systems for further details on the patient's review from today.   Objective:   Physical Exam:  There were no vitals taken for this visit.  Physical Exam Constitutional:      General: She is not in acute distress. Musculoskeletal:        General: No deformity.  Neurological:     Mental Status: She is alert and oriented to person, place, and time.     Coordination: Coordination normal.  Psychiatric:        Attention and Perception: Attention and perception normal. She does not perceive auditory or visual hallucinations.        Mood and Affect: Mood normal. Mood is not anxious or depressed. Affect is not labile, blunt, angry or inappropriate.        Speech: Speech normal.        Behavior: Behavior normal.        Thought Content: Thought content normal. Thought content is not paranoid or delusional. Thought content does not include homicidal or suicidal ideation. Thought content does not include homicidal or suicidal plan.        Cognition and Memory: Cognition and memory normal.        Judgment: Judgment normal.     Comments: Insight intact      Lab Review:     Component Value Date/Time   NA 136 06/21/2021 1631   NA 141 02/11/2018 1624   K 4.2 06/21/2021 1631   CL 102 06/21/2021 1631   CO2 30 06/21/2021 1631   GLUCOSE 84 06/21/2021 1631   BUN 13 06/21/2021 1631   BUN 8 02/11/2018 1624   CREATININE 0.71 06/21/2021 1631   CREATININE 0.65 06/12/2017 1010   CALCIUM 9.5 06/21/2021 1631   PROT 7.4 06/21/2021 1631   PROT 7.2 02/11/2018 1624   ALBUMIN 4.7 06/21/2021 1631   ALBUMIN 4.5 02/11/2018 1624   AST 15 06/21/2021 1631   ALT 11 06/21/2021 1631   ALKPHOS 44 06/21/2021 1631   BILITOT 0.6 06/21/2021 1631   BILITOT 0.2 02/11/2018 1624   GFRNONAA 121 02/11/2018 1624  GFRAA 140 02/11/2018 1624       Component Value Date/Time   WBC 7.4 06/21/2021 1631   RBC 4.00 06/21/2021 1631   HGB 13.2 06/21/2021 1631   HGB 13.3 02/11/2018 1624   HCT 39.1 06/21/2021 1631   HCT 39.1 02/11/2018 1624   PLT 298.0 06/21/2021 1631   PLT 336 02/11/2018 1624   MCV 97.8 06/21/2021 1631   MCV 96 02/11/2018 1624   MCH 32.6 02/11/2018 1624   MCH 32.1 06/12/2017 1010   MCHC 33.7 06/21/2021 1631   RDW 12.9 06/21/2021 1631   RDW 12.4 02/11/2018 1624   LYMPHSABS 1.5 06/21/2021 1631   LYMPHSABS 1.8 02/11/2018 1624   MONOABS 0.5 06/21/2021 1631   EOSABS 0.1 06/21/2021 1631   EOSABS 0.0 02/11/2018 1624   BASOSABS 0.1 06/21/2021 1631   BASOSABS 0.0 02/11/2018 1624    No results found for: "POCLITH", "LITHIUM"   No results found for: "PHENYTOIN", "PHENOBARB", "VALPROATE", "CBMZ"   .res Assessment: Plan:    Plan:  PDMP reviewed  Increase Adderall XR 10mg  to 20mg  daily Discontinue Wellbutrin XL150mg  daily.   Clonazepam 0.5mg  at hs as needed  Propranolol 10mg  BID  Time spent with patient was 15 minutes. Greater than 50% of face to face time with patient was spent on counseling and coordination of care.    Monitor BP between visits while taking stimulant medication.   Completed [facility] ADHD testing 47/58 - ADHD  likely   Meets DSM diagnostic criteria for ADHD diagnosis  RTC 3 months  Patient advised to contact office with any questions, adverse effects, or acute worsening in signs and symptoms.  Discussed potential benefits, risks, and side effects of stimulants with patient to include increased heart rate, palpitations, insomnia, increased anxiety, increased irritability, or decreased appetite.  Instructed patient to contact office if experiencing any significant tolerability issues.   There are no diagnoses linked to this encounter.   Please see After Visit Summary for patient specific instructions.  No future appointments.  No orders of the defined types were placed in this encounter.   -------------------------------

## 2022-10-06 ENCOUNTER — Ambulatory Visit (HOSPITAL_COMMUNITY): Payer: 59

## 2022-10-31 ENCOUNTER — Other Ambulatory Visit: Payer: Self-pay | Admitting: Adult Health

## 2022-10-31 DIAGNOSIS — G4700 Insomnia, unspecified: Secondary | ICD-10-CM

## 2022-11-07 NOTE — Progress Notes (Shared)
Subjective:    Briana Reed is a 37 y.o. female and is here for a comprehensive physical exam.  HPI  Health Maintenance Due  Topic Date Due   PAP SMEAR-Modifier  02/04/2018    Acute Concerns: ***  Chronic Issues: Anxiety and Depression Treated with clonazepam 0.5 mg at bedtime.  Hypertension Treated with propranolol 10 mg twice daily.  GERD  Vitamin D Deficiency Treated with Drisdol 50,000 units weekly.  ADD Treated with Adderall 20 mg daily.  Mirena IUD in-place.  Health Maintenance: Immunizations -- UTD on tetanus vaccine. Colonoscopy -- N/A Mammogram -- N/A PAP -- Last completed 10/08/22. Showed atypical squamous cells of undetermined significance. Bone Density -- N/A Diet -- *** Exercise -- ***  Sleep habits -- *** Mood -- ***  UTD with dentist? - *** UTD with eye doctor? - ***  Weight history: Wt Readings from Last 10 Encounters:  06/21/21 172 lb (78 kg)  09/26/20 179 lb (81.2 kg)  04/27/20 181 lb 6 oz (82.3 kg)  03/22/20 174 lb (78.9 kg)  09/21/19 173 lb 8 oz (78.7 kg)  02/11/19 168 lb (76.2 kg)  10/06/18 180 lb (81.6 kg)  02/25/18 183 lb (83 kg)  02/11/18 185 lb 3.2 oz (84 kg)  12/26/17 187 lb 6.4 oz (85 kg)   There is no height or weight on file to calculate BMI. No LMP recorded. (Menstrual status: IUD).  Alcohol use:  reports current alcohol use of about 2.0 standard drinks of alcohol per week.  Tobacco use:  Tobacco Use: Low Risk  (10/03/2022)   Patient History    Smoking Tobacco Use: Never    Smokeless Tobacco Use: Never    Passive Exposure: Not on file   Eligible for lung cancer screening? ***     06/21/2021    4:03 PM  Depression screen PHQ 2/9  Decreased Interest 0  Down, Depressed, Hopeless 0  PHQ - 2 Score 0  Altered sleeping 3  Tired, decreased energy 3  Change in appetite 2  Feeling bad or failure about yourself  1  Trouble concentrating 1  Moving slowly or fidgety/restless 0  Suicidal thoughts 0  PHQ-9 Score  10  Difficult doing work/chores Somewhat difficult     Other providers/specialists: Patient Care Team: Jarold Motto, [place] as PCP - General (Physician Assistant)    PMHx, SurgHx, SocialHx, Medications, and Allergies were reviewed in the Visit Navigator and updated as appropriate.   Past Medical History:  Diagnosis Date   Anemia    Anxiety    Depression    GERD (gastroesophageal reflux disease)    History of Papanicolaou smear of cervix 10/28/12; 08/02/15   NEG; LGSIL, HPV   Immunization, viral disease    GARDASIL COMPLETED   Vitamin D deficiency 07/2011     Past Surgical History:  Procedure Laterality Date   ESOPHAGOGASTRODUODENOSCOPY  2008   WNL     Family History  Problem Relation Age of Onset   Skin cancer Mother 12       MELANOMA   Hypertension Mother    Endometrial cancer Mother        on radiation treatments   Uterine cancer Mother 51   Hyperlipidemia Mother    Mitral valve prolapse Mother    Hypertension Father    Stroke Father    Hyperlipidemia Father    Lung cancer Maternal Grandmother 16   Uterine cancer Maternal Grandmother 13   Heart attack Maternal Grandfather    Heart attack Paternal Grandmother  Heart attack Paternal Grandfather    Stomach cancer Neg Hx    Colon cancer Neg Hx    Esophageal cancer Neg Hx    Pancreatic cancer Neg Hx     Social History   Tobacco Use   Smoking status: Never   Smokeless tobacco: Never  Vaping Use   Vaping Use: Never used  Substance Use Topics   Alcohol use: Yes    Alcohol/week: 2.0 standard drinks of alcohol    Types: 2 Glasses of wine per week    Comment: social    Drug use: No    Review of Systems:   ROS  Objective:   There were no vitals taken for this visit. There is no height or weight on file to calculate BMI.   General Appearance:    Alert, cooperative, no distress, appears stated age  Head:    Normocephalic, without obvious abnormality, atraumatic  Eyes:    PERRL,  conjunctiva/corneas clear, EOM's intact, fundi    benign, both eyes  Ears:    Normal TM's and external ear canals, both ears  Nose:   Nares normal, septum midline, mucosa normal, no drainage    or sinus tenderness  Throat:   Lips, mucosa, and tongue normal; teeth and gums normal  Neck:   Supple, symmetrical, trachea midline, no adenopathy;    thyroid:  no enlargement/tenderness/nodules; no carotid   bruit or JVD  Back:     Symmetric, no curvature, ROM normal, no CVA tenderness  Lungs:     Clear to auscultation bilaterally, respirations unlabored  Chest Wall:    No tenderness or deformity   Heart:    Regular rate and rhythm, S1 and S2 normal, no murmur, rub or gallop  Breast Exam:    ***No tenderness, masses, or nipple abnormality  Abdomen:     Soft, non-tender, bowel sounds active all four quadrants,    no masses, no organomegaly  Genitalia:    ***Normal female without lesion, discharge or tenderness  Extremities:   Extremities normal, atraumatic, no cyanosis or edema  Pulses:   2+ and symmetric all extremities  Skin:   Skin color, texture, turgor normal, no rashes or lesions  Lymph nodes:   Cervical, supraclavicular, and axillary nodes normal  Neurologic:   CNII-XII intact, normal strength, sensation and reflexes    throughout    Assessment/Plan:   ***   I,Alexander Ruley,acting as a scribe for [employer], PA.,have documented all relevant documentation on the behalf of Jarold Motto, PA,as directed by  Jarold Motto, PA while in the presence of Jarold Motto, [place].   ***   Jarold Motto, PA-C  [facility] [place]

## 2022-11-14 ENCOUNTER — Encounter: Payer: 59 | Admitting: Physician Assistant

## 2022-11-14 NOTE — Progress Notes (Shared)
Subjective:    Briana Reed is a 37 y.o. female and is here for a comprehensive physical exam.  HPI  Health Maintenance Due  Topic Date Due   PAP SMEAR-Modifier  02/04/2018    Acute Concerns: ***  Chronic Issues: Anxiety and Depression; ADHD Treated with clonazepam 0.5 mg at bedtime, Adderall 20 mg daily, propranolol 10 mg at bedtime.  GERD  Vitamin D Deficiency  Treated with Drisdol 50,000 units weekly.  Anemia   Mirena IUD in place  Health Maintenance: Immunizations -- UTD on tetanus vaccine. Colonoscopy -- N/A Mammogram -- N/A PAP -- Last completed 08/23/21. Negative for intraepithelial lesion or malignancy. Recommended repeat in 2026. Bone Density -- N/A Diet -- *** Exercise -- ***  Sleep habits -- *** Mood -- ***  UTD with dentist? - *** UTD with eye doctor? - ***  Weight history: Wt Readings from Last 10 Encounters:  06/21/21 172 lb (78 kg)  09/26/20 179 lb (81.2 kg)  04/27/20 181 lb 6 oz (82.3 kg)  03/22/20 174 lb (78.9 kg)  09/21/19 173 lb 8 oz (78.7 kg)  02/11/19 168 lb (76.2 kg)  10/06/18 180 lb (81.6 kg)  02/25/18 183 lb (83 kg)  02/11/18 185 lb 3.2 oz (84 kg)  12/26/17 187 lb 6.4 oz (85 kg)   There is no height or weight on file to calculate BMI. No LMP recorded. (Menstrual status: IUD).  Alcohol use:  reports current alcohol use of about 2.0 standard drinks of alcohol per week.  Tobacco use:  Tobacco Use: Low Risk  (10/03/2022)   Patient History    Smoking Tobacco Use: Never    Smokeless Tobacco Use: Never    Passive Exposure: Not on file   Eligible for lung cancer screening? ***     06/21/2021    4:03 PM  Depression screen PHQ 2/9  Decreased Interest 0  Down, Depressed, Hopeless 0  PHQ - 2 Score 0  Altered sleeping 3  Tired, decreased energy 3  Change in appetite 2  Feeling bad or failure about yourself  1  Trouble concentrating 1  Moving slowly or fidgety/restless 0  Suicidal thoughts 0  PHQ-9 Score 10  Difficult  doing work/chores Somewhat difficult     Other providers/specialists: Patient Care Team: Jarold Motto, [place] as PCP - General (Physician Assistant)    PMHx, SurgHx, SocialHx, Medications, and Allergies were reviewed in the Visit Navigator and updated as appropriate.   Past Medical History:  Diagnosis Date   Anemia    Anxiety    Depression    GERD (gastroesophageal reflux disease)    History of Papanicolaou smear of cervix 10/28/12; 08/02/15   NEG; LGSIL, HPV   Immunization, viral disease    GARDASIL COMPLETED   Vitamin D deficiency 07/2011     Past Surgical History:  Procedure Laterality Date   ESOPHAGOGASTRODUODENOSCOPY  2008   WNL     Family History  Problem Relation Age of Onset   Skin cancer Mother 48       MELANOMA   Hypertension Mother    Endometrial cancer Mother        on radiation treatments   Uterine cancer Mother 39   Hyperlipidemia Mother    Mitral valve prolapse Mother    Hypertension Father    Stroke Father    Hyperlipidemia Father    Lung cancer Maternal Grandmother 23   Uterine cancer Maternal Grandmother 80   Heart attack Maternal Grandfather    Heart attack Paternal  Grandmother    Heart attack Paternal Grandfather    Stomach cancer Neg Hx    Colon cancer Neg Hx    Esophageal cancer Neg Hx    Pancreatic cancer Neg Hx     Social History   Tobacco Use   Smoking status: Never   Smokeless tobacco: Never  Vaping Use   Vaping Use: Never used  Substance Use Topics   Alcohol use: Yes    Alcohol/week: 2.0 standard drinks of alcohol    Types: 2 Glasses of wine per week    Comment: social    Drug use: No    Review of Systems:   ROS  Objective:   There were no vitals taken for this visit. There is no height or weight on file to calculate BMI.   General Appearance:    Alert, cooperative, no distress, appears stated age  Head:    Normocephalic, without obvious abnormality, atraumatic  Eyes:    PERRL, conjunctiva/corneas clear, EOM's  intact, fundi    benign, both eyes  Ears:    Normal TM's and external ear canals, both ears  Nose:   Nares normal, septum midline, mucosa normal, no drainage    or sinus tenderness  Throat:   Lips, mucosa, and tongue normal; teeth and gums normal  Neck:   Supple, symmetrical, trachea midline, no adenopathy;    thyroid:  no enlargement/tenderness/nodules; no carotid   bruit or JVD  Back:     Symmetric, no curvature, ROM normal, no CVA tenderness  Lungs:     Clear to auscultation bilaterally, respirations unlabored  Chest Wall:    No tenderness or deformity   Heart:    Regular rate and rhythm, S1 and S2 normal, no murmur, rub or gallop  Breast Exam:    ***No tenderness, masses, or nipple abnormality  Abdomen:     Soft, non-tender, bowel sounds active all four quadrants,    no masses, no organomegaly  Genitalia:    ***Normal female without lesion, discharge or tenderness  Extremities:   Extremities normal, atraumatic, no cyanosis or edema  Pulses:   2+ and symmetric all extremities  Skin:   Skin color, texture, turgor normal, no rashes or lesions  Lymph nodes:   Cervical, supraclavicular, and axillary nodes normal  Neurologic:   CNII-XII intact, normal strength, sensation and reflexes    throughout    Assessment/Plan:   ***   I,Alexander Ruley,acting as a scribe for [employer], PA.,have documented all relevant documentation on the behalf of Jarold Motto, PA,as directed by  Jarold Motto, PA while in the presence of Jarold Motto, [place].   ***   Jarold Motto, PA-C [facility] [place]

## 2022-11-21 ENCOUNTER — Encounter: Payer: 59 | Admitting: Physician Assistant

## 2022-11-21 ENCOUNTER — Encounter: Payer: Self-pay | Admitting: Physician Assistant

## 2022-12-05 ENCOUNTER — Encounter (INDEPENDENT_AMBULATORY_CARE_PROVIDER_SITE_OTHER): Payer: Self-pay

## 2022-12-26 NOTE — Progress Notes (Signed)
Subjective:    Briana Reed is a 37 y.o. female and is here for a comprehensive physical exam.  HPI  There are no preventive care reminders to display for this patient.   Acute Concerns: Scoliosis Recently had abdominal xray that shows scoliosis -- this is a new finding for her She has intermittent back pain and would like further evaluation for this Denies issues with weakness, incontinence, numbness/tingling  Family history of cancer Interested in genetic counselor for evaluation of family history of cancer  Chronic Issues: Anxiety and Depression / ADHD Currently not on medication Closely followed by psychiatry Hoping to try new medications soon to see if she has better results Denies SI/HI  Vitamin B12 Deficiency  Treated with B12 injections in the past Currently taking oral B12 -- would like this rechecked today  Vitamin D Deficiency Treated with Drisdol 50,000 units weekly in the past  Uses Mirena IUD for birth control.   Health Maintenance: Immunizations -- UTD on tetanus vaccine. Colonoscopy -- N/A Mammogram -- N/A PAP -- 09/20/22 Pap showed atypical squamous cells of undetermined significance. Bone Density -- N/A Diet -- overall balanced diet; drinking water mostly Exercise -- stretching regularly  Sleep habits -- intermittent issues; working with psychiatry Mood -- see above  UTD with dentist? - yes UTD with eye doctor? - yes  Weight history: Wt Readings from Last 10 Encounters:  01/02/23 175 lb (79.4 kg)  06/21/21 172 lb (78 kg)  09/26/20 179 lb (81.2 kg)  04/27/20 181 lb 6 oz (82.3 kg)  03/22/20 174 lb (78.9 kg)  09/21/19 173 lb 8 oz (78.7 kg)  02/11/19 168 lb (76.2 kg)  10/06/18 180 lb (81.6 kg)  02/25/18 183 lb (83 kg)  02/11/18 185 lb 3.2 oz (84 kg)   Body mass index is 30.04 kg/m. No LMP recorded. (Menstrual status: IUD).  Alcohol use:  reports current alcohol use of about 10.0 standard drinks of alcohol per week.  Tobacco use:   Tobacco Use: Low Risk  (01/02/2023)   Patient History    Smoking Tobacco Use: Never    Smokeless Tobacco Use: Never    Passive Exposure: Not on file   Eligible for lung cancer screening? no     01/02/2023    1:18 PM  Depression screen PHQ 2/9  Decreased Interest 2  Down, Depressed, Hopeless 2  PHQ - 2 Score 4  Altered sleeping 2  Tired, decreased energy 2  Change in appetite 2  Feeling bad or failure about yourself  2  Trouble concentrating 2  Moving slowly or fidgety/restless 0  Suicidal thoughts 0  PHQ-9 Score 14  Difficult doing work/chores Very difficult     Other providers/specialists: Patient Care Team: Jarold Motto, [place] as PCP - General (Physician Assistant)    PMHx, SurgHx, SocialHx, Medications, and Allergies were reviewed in the Visit Navigator and updated as appropriate.   Past Medical History:  Diagnosis Date   Anemia    Anxiety    Depression    GERD (gastroesophageal reflux disease)    History of Papanicolaou smear of cervix 10/28/12; 08/02/15   NEG; LGSIL, HPV   Immunization, viral disease    GARDASIL COMPLETED   Vitamin D deficiency 07/2011     Past Surgical History:  Procedure Laterality Date   ESOPHAGOGASTRODUODENOSCOPY  2008   WNL     Family History  Problem Relation Age of Onset   Skin cancer Mother 21       MELANOMA   Hypertension  Mother    Cervical cancer Mother 50   Uterine cancer Mother 49   Hyperlipidemia Mother    Mitral valve prolapse Mother    Diabetes Mother        pre-diabetes   Hypertension Father    Stroke Father    Hyperlipidemia Father    Colon polyps Father    Depression Sister    Lung cancer Maternal Grandmother 44   Uterine cancer Maternal Grandmother 70   Heart attack Maternal Grandfather    Heart attack Paternal Grandmother    Heart attack Paternal Grandfather    Stomach cancer Neg Hx    Colon cancer Neg Hx    Esophageal cancer Neg Hx    Pancreatic cancer Neg Hx     Social History   Tobacco Use    Smoking status: Never   Smokeless tobacco: Never  Vaping Use   Vaping status: Never Used  Substance Use Topics   Alcohol use: Yes    Alcohol/week: 10.0 standard drinks of alcohol    Types: 10 Glasses of wine per week    Comment: social    Drug use: No    Review of Systems:   Review of Systems  Constitutional:  Negative for chills, fever, malaise/fatigue and weight loss.  HENT:  Negative for hearing loss, sinus pain and sore throat.   Respiratory:  Negative for cough and hemoptysis.   Cardiovascular:  Negative for chest pain, palpitations, leg swelling and PND.  Gastrointestinal:  Negative for abdominal pain, constipation, diarrhea, heartburn, nausea and vomiting.  Genitourinary:  Negative for dysuria, frequency and urgency.  Musculoskeletal:  Negative for back pain, myalgias and neck pain.  Skin:  Negative for itching and rash.  Neurological:  Negative for dizziness, tingling, seizures and headaches.  Endo/Heme/Allergies:  Negative for polydipsia.  Psychiatric/Behavioral:  Negative for depression. The patient is not nervous/anxious.     Objective:   BP 116/80 (BP Location: Left Arm, Patient Position: Sitting, Cuff Size: Normal)   Pulse 90   Temp 98.4 F (36.9 C) (Temporal)   Ht 5\' 4"  (1.626 m)   Wt 175 lb (79.4 kg)   SpO2 99%   BMI 30.04 kg/m  Body mass index is 30.04 kg/m.   General Appearance:    Alert, cooperative, no distress, appears stated age  Head:    Normocephalic, without obvious abnormality, atraumatic  Eyes:    PERRL, conjunctiva/corneas clear, EOM's intact, fundi    benign, both eyes  Ears:    Normal TM's and external ear canals, both ears  Nose:   Nares normal, septum midline, mucosa normal, no drainage    or sinus tenderness  Throat:   Lips, mucosa, and tongue normal; teeth and gums normal  Neck:   Supple, symmetrical, trachea midline, no adenopathy;    thyroid:  no enlargement/tenderness/nodules; no carotid   bruit or JVD  Back:     Symmetric,  no curvature, ROM normal, no CVA tenderness  Lungs:     Clear to auscultation bilaterally, respirations unlabored  Chest Wall:    No tenderness or deformity   Heart:    Regular rate and rhythm, S1 and S2 normal, no murmur, rub or gallop  Breast Exam:    Deferred  Abdomen:     Soft, non-tender, bowel sounds active all four quadrants,    no masses, no organomegaly  Genitalia:    Deferred  Extremities:   Extremities normal, atraumatic, no cyanosis or edema  Pulses:   2+ and symmetric all extremities  Skin:   Skin color, texture, turgor normal, no rashes or lesions  Lymph nodes:   Cervical, supraclavicular, and axillary nodes normal  Neurologic:   CNII-XII intact, normal strength, sensation and reflexes    throughout    Assessment/Plan:   Routine physical examination Today patient counseled on age appropriate routine health concerns for screening and prevention, each reviewed and up to date or declined. Immunizations reviewed and up to date or declined. Labs ordered and reviewed. Risk factors for depression reviewed and negative. Hearing function and visual acuity are intact. ADLs screened and addressed as needed. Functional ability and level of safety reviewed and appropriate. Education, counseling and referrals performed based on assessed risks today. Patient provided with a copy of personalized plan for preventive services.  Obesity, unspecified classification, unspecified obesity type, unspecified whether serious comorbidity present Continue efforts at healthy lifestyle  Vitamin D deficiency Update vitamin D and provide recommendations  Other fatigue Ongoing chronic issue Update blood work Continue to work closely with psychiatry for [employer]) management Work on healthy lifestyle Follow-up if symptom(s) worsen/persist  Anxiety and depression No red flags Management per psychiatry Denies suicidal ideation/hi  Attention deficit hyperactivity disorder (ADHD), combined type No  red flags Management per psychiatry  Family history of cancer Referral to genetics  Chronic bilateral low back pain without sciatica No red flags Referral to physical therapy Follow-up with [place] if new/worsening/lack of improvement   I,Alexander Ruley,acting as a scribe for [employer], PA.,have documented all relevant documentation on the behalf of Jarold Motto, PA,as directed by  Jarold Motto, PA while in the presence of Jarold Motto, [place].  I, Jarold Motto, [place], have reviewed all documentation for this visit. The documentation on 01/02/23 for the exam, diagnosis, procedures, and orders are all accurate and complete.  Jarold Motto, PA-C  [facility]

## 2022-12-31 ENCOUNTER — Ambulatory Visit (INDEPENDENT_AMBULATORY_CARE_PROVIDER_SITE_OTHER): Payer: 59

## 2022-12-31 ENCOUNTER — Ambulatory Visit (HOSPITAL_COMMUNITY)
Admission: RE | Admit: 2022-12-31 | Discharge: 2022-12-31 | Disposition: A | Discharge status: Home / Self Care | Payer: 59 | Source: Ambulatory Visit | Attending: Family Medicine | Admitting: Family Medicine

## 2022-12-31 ENCOUNTER — Encounter (HOSPITAL_COMMUNITY): Payer: Self-pay

## 2022-12-31 VITALS — BP 122/73 | HR 91 | Temp 98.2°F | Resp 16

## 2022-12-31 DIAGNOSIS — M545 Low back pain, unspecified: Secondary | ICD-10-CM | POA: Diagnosis not present

## 2022-12-31 DIAGNOSIS — R140 Abdominal distension (gaseous): Secondary | ICD-10-CM | POA: Diagnosis not present

## 2022-12-31 DIAGNOSIS — R300 Dysuria: Secondary | ICD-10-CM | POA: Diagnosis not present

## 2022-12-31 LAB — POCT URINALYSIS DIP (MANUAL ENTRY)
Bilirubin, UA: NEGATIVE
Blood, UA: NEGATIVE
Glucose, UA: NEGATIVE mg/dL
Ketones, POC UA: NEGATIVE mg/dL
Leukocytes, UA: NEGATIVE
Nitrite, UA: NEGATIVE
Protein Ur, POC: NEGATIVE mg/dL
Spec Grav, UA: 1.01 (ref 1.010–1.025)
Urobilinogen, UA: 0.2 E.U./dL
pH, UA: 7 (ref 5.0–8.0)

## 2022-12-31 LAB — POCT URINE PREGNANCY: Preg Test, Ur: NEGATIVE

## 2022-12-31 NOTE — ED Provider Notes (Signed)
[facility]    CSN: [id number] Arrival date & time: 12/31/22  1522      History   Chief Complaint Chief Complaint  Patient presents with   Flank Pain    Possible uti - Entered by patient    HPI Briana Reed is a 37 y.o. female.    Flank Pain Associated symptoms include abdominal pain.  She has been having some low left back pain x 1 week, thought it was a strained muscle.  Now having pains on the right.  Feeling bloated, fatigued.  No painful urination or frequency per se.  She has a dull burning sensation in her low back.  No vaginal symptoms.   She has an iud, spotting about a month ago.  No fevers, but feels flushed.  She does not have a h/o utis       Past Medical History:  Diagnosis Date   Anemia    Anxiety    Depression    GERD (gastroesophageal reflux disease)    History of Papanicolaou smear of cervix 10/28/12; 08/02/15   NEG; LGSIL, HPV   Immunization, viral disease    GARDASIL COMPLETED   Vitamin D deficiency 07/2011    Patient Active Problem List   Diagnosis Date Noted   Anxiety state 12/02/2018   Mild recurrent major depression (HCC) 10/06/2018   Herpetic whitlow 12/26/2017   LGSIL on Pap smear of cervix 10/14/2017   Cervical high risk human papillomavirus (HPV) DNA test positive 08/05/2017    Past Surgical History:  Procedure Laterality Date   ESOPHAGOGASTRODUODENOSCOPY  2008   WNL    OB History     Gravida  0   Para  0   Term  0   Preterm  0   AB  0   Living  0      SAB  0   IAB  0   Ectopic  0   Multiple  0   Live Births  0            Home Medications    Prior to Admission medications   Medication Sig Start Date End Date Taking? Authorizing Provider  amphetamine-dextroamphetamine (ADDERALL XR) 20 MG 24 hr capsule Take 1 capsule (20 mg total) by mouth daily. 10/03/22  Yes Mozingo, Thereasa Solo, NP  amphetamine-dextroamphetamine (ADDERALL XR) 20 MG 24 hr capsule Take 1 capsule (20 mg total)  by mouth daily. 10/31/22  Yes Mozingo, Thereasa Solo, NP  amphetamine-dextroamphetamine (ADDERALL XR) 20 MG 24 hr capsule Take 1 capsule (20 mg total) by mouth daily. 11/28/22  Yes Mozingo, Thereasa Solo, NP  clonazePAM (KLONOPIN) 0.5 MG tablet TAKE 1 TABLET BY MOUTH AT BEDTIME 11/01/22  Yes Mozingo, Thereasa Solo, NP  propranolol (INDERAL) 10 MG tablet Take 1 tablet (10 mg total) by mouth 2 (two) times daily. 06/27/22  Yes Mozingo, Thereasa Solo, NP  Syringe/Needle, Disp, (SYRINGE 12CC/21GX1") 21G X 1" 12 ML MISC 1 each by Does not apply route as needed. Use one syringe per B12 injections  Once weekly for 3 weeks then once monthly for 3 months 07/21/21  Yes Worley, Willard, PA  Syringe/Needle, Disp, (SYRINGE 3CC/25GX1") 25G X 1" 3 ML MISC 1 each by Does not apply route as needed. 07/18/21   Jarold Motto, PA  Vitamin D, Ergocalciferol, (DRISDOL) 1.25 MG (50000 UNIT) CAPS capsule Take 1 capsule (50,000 Units total) by mouth every 7 (seven) days. 06/29/21  Yes Jarold Motto, PA  levonorgestrel (MIRENA, 52 MG,) 20 MCG/24HR IUD  1 Intra Uterine Device (1 each total) by Intrauterine route once for 1 dose. Patient taking differently: 1 each by Intrauterine route once. Inserted 08/12/2017 by GYN, needs to be removed 08/2022. 08/12/17 02/11/19  Copland, Ilona Sorrel, PA-C    Family History Family History  Problem Relation Age of Onset   Skin cancer Mother 32       MELANOMA   Hypertension Mother    Endometrial cancer Mother        on radiation treatments   Uterine cancer Mother 53   Hyperlipidemia Mother    Mitral valve prolapse Mother    Hypertension Father    Stroke Father    Hyperlipidemia Father    Lung cancer Maternal Grandmother 49   Uterine cancer Maternal Grandmother 64   Heart attack Maternal Grandfather    Heart attack Paternal Grandmother    Heart attack Paternal Grandfather    Stomach cancer Neg Hx    Colon cancer Neg Hx    Esophageal cancer Neg Hx    Pancreatic cancer Neg Hx      Social History Social History   Tobacco Use   Smoking status: Never   Smokeless tobacco: Never  Vaping Use   Vaping status: Never Used  Substance Use Topics   Alcohol use: Yes    Alcohol/week: 2.0 standard drinks of alcohol    Types: 2 Glasses of wine per week    Comment: social    Drug use: No     Allergies   Patient has no known allergies.   Review of Systems Review of Systems  Constitutional:  Positive for fatigue.  HENT: Negative.    Cardiovascular: Negative.   Gastrointestinal:  Positive for abdominal pain.  Genitourinary:  Positive for flank pain. Negative for vaginal bleeding and vaginal discharge.  Psychiatric/Behavioral: Negative.       Physical Exam Triage Vital Signs ED Triage Vitals [12/31/22 1544]  Encounter Vitals Group     BP 122/73     Systolic BP Percentile      Diastolic BP Percentile      Pulse Rate 91     Resp 16     Temp 98.2 F (36.8 C)     Temp Source Oral     SpO2 98 %     Weight      Height      Head Circumference      Peak Flow      Pain Score      Pain Loc      Pain Education      Exclude from Growth Chart    No data found.  Updated Vital Signs BP 122/73 (BP Location: Left Arm)   Pulse 91   Temp 98.2 F (36.8 C) (Oral)   Resp 16   SpO2 98%   Visual Acuity Right Eye Distance:   Left Eye Distance:   Bilateral Distance:    Right Eye Near:   Left Eye Near:    Bilateral Near:     Physical Exam Constitutional:      Appearance: Normal appearance.  HENT:     Mouth/Throat:     Mouth: Mucous membranes are moist.  Cardiovascular:     Rate and Rhythm: Normal rate and regular rhythm.  Pulmonary:     Effort: Pulmonary effort is normal.     Breath sounds: Normal breath sounds.  Abdominal:     General: Bowel sounds are normal.     Palpations: Abdomen is soft.     Tenderness:  There is abdominal tenderness.     Comments: Mild tenderness at the suprapubic area;  no CVA tenderness  Musculoskeletal:        General:  Normal range of motion.  Skin:    General: Skin is warm.  Neurological:     General: No focal deficit present.     Mental Status: She is alert.  Psychiatric:        Mood and Affect: Mood normal.      UC Treatments / Results  Labs (all labs ordered are listed, but only abnormal results are displayed) Labs Reviewed - No data to display  EKG   Radiology No results found.  Procedures Procedures (including critical care time)  Medications Ordered in UC Medications - No data to display  Initial Impression / Assessment and Plan / UC Course  I have reviewed the triage vital signs and the nursing notes.  Pertinent labs & imaging results that were available during my care of the patient were reviewed by me and considered in my medical decision making (see chart for details).   Final Clinical Impressions(s) / UC Diagnoses   Final diagnoses:  Bloating  Bilateral low back pain without sciatica, unspecified chronicity     Discharge Instructions      You were seen today for abdominal pain,  back pain and bloating.  Your urine was normal.  Your xrays shows possible increased stool, but I am still waiting on the radiologist to read the film officially.  I recommend you get over the counter mirilax or a stool softener to see if helpful.  If you have worsening pain, fever, chills, nausea or vomiting then go to the ER for further evaluation.     ED Prescriptions   None    PDMP not reviewed this encounter.   Jannifer Franklin, MD 12/31/22 [phone]

## 2022-12-31 NOTE — ED Triage Notes (Signed)
Patient is here for lower back pain and dysuria x 3 days.

## 2022-12-31 NOTE — Discharge Instructions (Signed)
You were seen today for abdominal pain,  back pain and bloating.  Your urine was normal.  Your xrays shows possible increased stool, but I am still waiting on the radiologist to read the film officially.  I recommend you get over the counter mirilax or a stool softener to see if helpful.  If you have worsening pain, fever, chills, nausea or vomiting then go to the ER for further evaluation.

## 2023-01-02 ENCOUNTER — Ambulatory Visit (INDEPENDENT_AMBULATORY_CARE_PROVIDER_SITE_OTHER): Payer: 59 | Admitting: Physician Assistant

## 2023-01-02 ENCOUNTER — Encounter: Payer: Self-pay | Admitting: Physician Assistant

## 2023-01-02 VITALS — BP 116/80 | HR 90 | Temp 98.4°F | Ht 64.0 in | Wt 175.0 lb

## 2023-01-02 DIAGNOSIS — Z809 Family history of malignant neoplasm, unspecified: Secondary | ICD-10-CM

## 2023-01-02 DIAGNOSIS — M545 Low back pain, unspecified: Secondary | ICD-10-CM

## 2023-01-02 DIAGNOSIS — Z0000 Encounter for general adult medical examination without abnormal findings: Secondary | ICD-10-CM

## 2023-01-02 DIAGNOSIS — F902 Attention-deficit hyperactivity disorder, combined type: Secondary | ICD-10-CM

## 2023-01-02 DIAGNOSIS — G8929 Other chronic pain: Secondary | ICD-10-CM | POA: Diagnosis not present

## 2023-01-02 DIAGNOSIS — E669 Obesity, unspecified: Secondary | ICD-10-CM | POA: Diagnosis not present

## 2023-01-02 DIAGNOSIS — F419 Anxiety disorder, unspecified: Secondary | ICD-10-CM

## 2023-01-02 DIAGNOSIS — E559 Vitamin D deficiency, unspecified: Secondary | ICD-10-CM | POA: Diagnosis not present

## 2023-01-02 DIAGNOSIS — F32A Depression, unspecified: Secondary | ICD-10-CM | POA: Diagnosis not present

## 2023-01-02 DIAGNOSIS — R5383 Other fatigue: Secondary | ICD-10-CM

## 2023-01-02 LAB — CBC WITH DIFFERENTIAL/PLATELET
Basophils Absolute: 0 10*3/uL (ref 0.0–0.1)
Basophils Relative: 0.5 % (ref 0.0–3.0)
Eosinophils Absolute: 0 10*3/uL (ref 0.0–0.7)
Eosinophils Relative: 0.8 % (ref 0.0–5.0)
HCT: 40.4 % (ref 36.0–46.0)
Hemoglobin: 13.3 g/dL (ref 12.0–15.0)
Lymphocytes Relative: 24.4 % (ref 12.0–46.0)
Lymphs Abs: 1.4 10*3/uL (ref 0.7–4.0)
MCHC: 33 g/dL (ref 30.0–36.0)
MCV: 98.2 fl (ref 78.0–100.0)
Monocytes Absolute: 0.5 10*3/uL (ref 0.1–1.0)
Monocytes Relative: 8.4 % (ref 3.0–12.0)
Neutro Abs: 3.8 10*3/uL (ref 1.4–7.7)
Neutrophils Relative %: 65.9 % (ref 43.0–77.0)
Platelets: 293 10*3/uL (ref 150.0–400.0)
RBC: 4.12 Mil/uL (ref 3.87–5.11)
RDW: 12.7 % (ref 11.5–15.5)
WBC: 5.8 10*3/uL (ref 4.0–10.5)

## 2023-01-02 LAB — COMPREHENSIVE METABOLIC PANEL
ALT: 11 U/L (ref 0–35)
AST: 14 U/L (ref 0–37)
Albumin: 4.3 g/dL (ref 3.5–5.2)
Alkaline Phosphatase: 56 U/L (ref 39–117)
BUN: 10 mg/dL (ref 6–23)
CO2: 27 meq/L (ref 19–32)
Calcium: 9.5 mg/dL (ref 8.4–10.5)
Chloride: 102 meq/L (ref 96–112)
Creatinine, Ser: 0.6 mg/dL (ref 0.40–1.20)
GFR: 115.45 mL/min (ref >60.00)
Glucose, Bld: 92 mg/dL (ref 70–99)
Potassium: 3.9 meq/L (ref 3.5–5.1)
Sodium: 137 meq/L (ref 135–145)
Total Bilirubin: 0.7 mg/dL (ref 0.2–1.2)
Total Protein: 7 g/dL (ref 6.0–8.3)

## 2023-01-02 LAB — LIPID PANEL
Cholesterol: 110 mg/dL (ref 0–200)
HDL: 54.2 mg/dL (ref >39.00)
LDL Cholesterol: 46 mg/dL (ref 0–99)
NonHDL: 55.4
Total CHOL/HDL Ratio: 2
Triglycerides: 48 mg/dL (ref 0.0–149.0)
VLDL: 9.6 mg/dL (ref 0.0–40.0)

## 2023-01-02 NOTE — Patient Instructions (Addendum)
It was great to see you!  Genetics Referral today Physical therapy referral as well  Please go to the lab for blood work.   Our office will call you with your results unless you have chosen to receive results via MyChart.  If your blood work is normal we will follow-up each year for physicals and as scheduled for chronic medical problems.  If anything is abnormal we will treat accordingly and get you in for a follow-up.  Take care,  Lelon Mast

## 2023-01-03 LAB — IBC + FERRITIN
Ferritin: 35.9 ng/mL (ref 10.0–291.0)
Iron: 165 ug/dL — ABNORMAL HIGH (ref 42–145)
Saturation Ratios: 44 % (ref 20.0–50.0)
TIBC: 375.2 ug/dL (ref 250.0–450.0)
Transferrin: 268 mg/dL (ref 212.0–360.0)

## 2023-01-03 LAB — VITAMIN B12: Vitamin B-12: 774 pg/mL (ref 211–911)

## 2023-01-03 LAB — VITAMIN D 25 HYDROXY (VIT D DEFICIENCY, FRACTURES): VITD: 35.4 ng/mL (ref 30.00–100.00)

## 2023-01-04 ENCOUNTER — Telehealth: Payer: Self-pay | Admitting: Genetic Counselor

## 2023-01-10 ENCOUNTER — Ambulatory Visit: Payer: 59 | Admitting: Adult Health

## 2023-01-21 ENCOUNTER — Inpatient Hospital Stay: Payer: 59

## 2023-01-21 ENCOUNTER — Inpatient Hospital Stay: Payer: 59 | Attending: Genetic Counselor | Admitting: Genetic Counselor

## 2023-02-07 DIAGNOSIS — L918 Other hypertrophic disorders of the skin: Secondary | ICD-10-CM | POA: Diagnosis not present

## 2023-02-20 ENCOUNTER — Encounter: Payer: Self-pay | Admitting: Adult Health

## 2023-02-20 ENCOUNTER — Ambulatory Visit (INDEPENDENT_AMBULATORY_CARE_PROVIDER_SITE_OTHER): Payer: 59 | Admitting: Adult Health

## 2023-02-20 DIAGNOSIS — F909 Attention-deficit hyperactivity disorder, unspecified type: Secondary | ICD-10-CM | POA: Diagnosis not present

## 2023-02-20 MED ORDER — LISDEXAMFETAMINE DIMESYLATE 20 MG PO CAPS
20.0000 mg | ORAL_CAPSULE | Freq: Every day | ORAL | 0 refills | Status: DC
Start: 2023-02-20 — End: 2023-05-10

## 2023-02-20 NOTE — Progress Notes (Signed)
Briana Reed [id number] 1985/05/13 37 y.o.  Subjective:   Patient ID:  Briana Reed is a 37 y.o. (DOB 10/15/1985) female.  Chief Complaint: No chief complaint on file.   HPI Briana Reed presents to the office today for follow-up of ADHD.  Describes mood today as "ok". Pleasant. Denies tearfulness. Mood symptoms - reports some depression, anxiety and irritability - "a little bit". Denies panic attacks. Reports some worry, rumination, and over thinking. Mood is lower. Stating "I feel like I'm doing ok, but could be doing better". Lower interest and motivation. Taking medications as prescribed.  Energy levels "not the best". Active, does not have a regular exercise routine.  Enjoys some usual interests and activities. Dating - has a boyfriend. Lives alone with 3 cats and a bearded dragon. Sister and mother in [place]. Father in [place]. Talking to friends. Appetite adequate. Weight stable - 172 pounds.  Sleeps well most nights. Averages 7 hours. Focus and concentration difficulties. Completing tasks. Managing aspects of household. 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 Denies SI or HI. Denies AH or VH. Denies self harm. Denies substance use.  Previous medication trials: Wellbutrin XL, Trintellix   AUDIT    Flowsheet Row Office Visit from 10/06/2018 in [facility] Care at [facility] [address]  Alcohol Use Disorder Identification Test Final Score (AUDIT) 6      GAD-7    Flowsheet Row Office Visit from 01/02/2023 in [facility] from 06/21/2021 in [facility] from 09/21/2019 in [facility] from 02/11/2019 in [facility] from 02/11/2018 in [facility] Care at [facility]  Total GAD-7 Score 13 14 18 17 4       PHQ2-9    Flowsheet Row Office Visit from 01/02/2023 in [facility] from 06/21/2021 in [facility] from 09/21/2019 in [facility] from 04/17/2019 in [facility] from 02/11/2019 in [facility]  PHQ-2 Total Score 4 0 3 6 2   PHQ-9 Total Score 14 10 13 20 12       Flowsheet Row ED from 07/18/2020 in [facility] RISK CATEGORY No Risk        Review of Systems:  Review of Systems  Musculoskeletal:  Negative for gait problem.  Neurological:  Negative for tremors.  Psychiatric/Behavioral:         Please refer to HPI    Medications: I have reviewed the patient's current medications.  Current Outpatient Medications  Medication Sig Dispense Refill   cyanocobalamin (VITAMIN B12) 1000 MCG tablet Take 1,000 mcg by mouth daily.     levonorgestrel (MIRENA, 52 MG,) 20 MCG/24HR IUD 1 Intra Uterine Device (1 each total) by Intrauterine route once for 1 dose. (Patient taking differently: 1 each by Intrauterine route once. Inserted 08/12/2017 by GYN, needs to be removed 08/2022.) 1 Intra Uterine Device 0   VITAMIN D, ERGOCALCIFEROL, PO Take 5,000 Units by mouth daily in the afternoon.     No current facility-administered medications for this visit.    Medication Side Effects: None  Allergies: No Known Allergies  Past Medical History:  Diagnosis Date   Anemia    Anxiety    Depression    GERD (gastroesophageal reflux disease)    History of Papanicolaou smear of  cervix 10/28/12; 08/02/15   NEG; LGSIL, HPV   Immunization, viral disease    GARDASIL COMPLETED   Vitamin D deficiency 07/2011    Past Medical History, Surgical history, Social history, and Family history were reviewed and updated as appropriate.   Please see review of systems for further details on the patient's review from today.   Objective:   Physical Exam:  There were no vitals taken for this  visit.  Physical Exam Constitutional:      General: She is not in acute distress. Musculoskeletal:        General: No deformity.  Neurological:     Mental Status: She is alert and oriented to person, place, and time.     Coordination: Coordination normal.  Psychiatric:        Attention and Perception: Attention and perception normal. She does not perceive auditory or visual hallucinations.        Mood and Affect: Mood normal. Mood is not anxious or depressed. Affect is not labile, blunt, angry or inappropriate.        Speech: Speech normal.        Behavior: Behavior normal.        Thought Content: Thought content normal. Thought content is not paranoid or delusional. Thought content does not include homicidal or suicidal ideation. Thought content does not include homicidal or suicidal plan.        Cognition and Memory: Cognition and memory normal.        Judgment: Judgment normal.     Comments: Insight intact     Lab Review:     Component Value Date/Time   NA 137 01/02/2023 1348   NA 141 02/11/2018 1624   K 3.9 01/02/2023 1348   CL 102 01/02/2023 1348   CO2 27 01/02/2023 1348   GLUCOSE 92 01/02/2023 1348   BUN 10 01/02/2023 1348   BUN 8 02/11/2018 1624   CREATININE 0.60 01/02/2023 1348   CREATININE 0.65 06/12/2017 1010   CALCIUM 9.5 01/02/2023 1348   PROT 7.0 01/02/2023 1348   PROT 7.2 02/11/2018 1624   ALBUMIN 4.3 01/02/2023 1348   ALBUMIN 4.5 02/11/2018 1624   AST 14 01/02/2023 1348   ALT 11 01/02/2023 1348   ALKPHOS 56 01/02/2023 1348   BILITOT 0.7 01/02/2023 1348   BILITOT 0.2 02/11/2018 1624   GFRNONAA 121 02/11/2018 1624   GFRAA 140 02/11/2018 1624       Component Value Date/Time   WBC 5.8 01/02/2023 1348   RBC 4.12 01/02/2023 1348   HGB 13.3 01/02/2023 1348   HGB 13.3 02/11/2018 1624   HCT 40.4 01/02/2023 1348   HCT 39.1 02/11/2018 1624   PLT 293.0 01/02/2023 1348   PLT 336 02/11/2018 1624   MCV 98.2 01/02/2023 1348   MCV 96 02/11/2018 1624   MCH  32.6 02/11/2018 1624   MCH 32.1 06/12/2017 1010   MCHC 33.0 01/02/2023 1348   RDW 12.7 01/02/2023 1348   RDW 12.4 02/11/2018 1624   LYMPHSABS 1.4 01/02/2023 1348   LYMPHSABS 1.8 02/11/2018 1624   MONOABS 0.5 01/02/2023 1348   EOSABS 0.0 01/02/2023 1348   EOSABS 0.0 02/11/2018 1624   BASOSABS 0.0 01/02/2023 1348   BASOSABS 0.0 02/11/2018 1624    No results found for: "POCLITH", "LITHIUM"   No results found for: "PHENYTOIN", "PHENOBARB", "VALPROATE", "CBMZ"   .res Assessment: Plan:    Plan:  PDMP reviewed  Add Vyvanse 20mg  for the next 4 weeks - will call for refills if helpful  and return in 3 months.  106/74/68  Consider Ritalin  Discontinue Adderall XR 10mg  to 20mg  daily Discontinue Wellbutrin XL150mg  daily. Discontinue Clonazepam 0.5mg  at hs as needed Discontinue Propranolol 10mg  BID  Monitor BP between visits while taking stimulant medication.   Completed [facility] testing 47/58 - ADHD likely   Meets DSM diagnostic criteria for ADHD diagnosis  RTC 3 months  Patient advised to contact office with any questions, adverse effects, or acute worsening in signs and symptoms.  Discussed potential benefits, risks, and side effects of stimulants with patient to include increased heart rate, palpitations, insomnia, increased anxiety, increased irritability, or decreased appetite.  Instructed patient to contact office if experiencing any significant tolerability issues.   There are no diagnoses linked to this encounter.   Please see After Visit Summary for patient specific instructions.  No future appointments.  No orders of the defined types were placed in this encounter.   -------------------------------

## 2023-05-10 ENCOUNTER — Ambulatory Visit (INDEPENDENT_AMBULATORY_CARE_PROVIDER_SITE_OTHER): Payer: Commercial Managed Care - HMO | Admitting: Adult Health

## 2023-05-10 ENCOUNTER — Other Ambulatory Visit (HOSPITAL_COMMUNITY): Payer: Self-pay

## 2023-05-10 ENCOUNTER — Encounter: Payer: Self-pay | Admitting: Adult Health

## 2023-05-10 DIAGNOSIS — F909 Attention-deficit hyperactivity disorder, unspecified type: Secondary | ICD-10-CM

## 2023-05-10 DIAGNOSIS — F411 Generalized anxiety disorder: Secondary | ICD-10-CM | POA: Diagnosis not present

## 2023-05-10 DIAGNOSIS — G4700 Insomnia, unspecified: Secondary | ICD-10-CM | POA: Diagnosis not present

## 2023-05-10 MED ORDER — LISDEXAMFETAMINE DIMESYLATE 20 MG PO CAPS
20.0000 mg | ORAL_CAPSULE | Freq: Every day | ORAL | 0 refills | Status: DC
Start: 2023-05-10 — End: 2023-06-18
  Filled 2023-05-10 – 2023-05-17 (×3): qty 30, 30d supply, fill #0

## 2023-05-10 NOTE — Progress Notes (Signed)
 Briana Reed [id number] February 12, 1986 38 y.o.  Subjective:   Patient ID:  Briana Reed is a 38 y.o. (DOB 05-Mar-1986) female.  Chief Complaint: No chief complaint on file.   HPI Briana Reed presents to the office today for follow-up of ADHD.  Describes mood today as so-so. Pleasant. Denies tearfulness. Mood symptoms - reports some depression, anxiety and irritability. Reports lower interest and motivation. Denies panic attacks. Reports some worry, rumination, and over thinking. Mood is lower. Stating I'm not doing great. Reports willing to consider other options. Taking medications as prescribed.  Energy levels not the best. Active, does not have a regular exercise routine.  Enjoys some usual interests and activities. Dating - has a boyfriend. Lives alone with 3 cats and a bearded dragon. Sister and mother in [place]. Father in [place] . Talking to friends. Appetite adequate. Weight stable - 170 to 175 pounds.  Sleeps well most nights. Averages 8 to 9 hours. Focus and concentration difficulties. Completing tasks. Managing aspects of household. 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 Denies SI or HI. Denies AH or VH. Denies self harm. Denies substance use.  Previous medication trials: Wellbutrin  XL, Trintellix    AUDIT    Flowsheet Row Office Visit from 10/06/2018 in Primary Care at [facility]  Alcohol Use Disorder Identification Test Final Score (AUDIT) 6      GAD-7    Flowsheet Row Office Visit from 01/02/2023 in [facility] [place] [facility] [employer] Visit from 06/21/2021 in [facility] [place] [facility] [employer] from 09/21/2019 in [facility] [place] [facility] [place] [employer] Visit from 02/11/2019 in [facility] [place] [facility] Visit from 02/11/2018 in Primary Care at [place]  Total GAD-7 Score 13 14 18 17 4        PHQ2-9    Flowsheet Row Office Visit from 01/02/2023 in [facility] [place] [facility] Visit from 06/21/2021 in [facility] [facility] [place] [employer] Visit from 09/21/2019 in [facility] [place] [employer] Visit from 04/17/2019 in [facility] [place] [facility] [place] [employer] Visit from 02/11/2019 in [facility] [place] [facility] [place]  PHQ-2 Total Score 4 0 3 6 2   PHQ-9 Total Score 14 10 13 20 12       Flowsheet Row ED from 07/18/2020 in [facility] RISK CATEGORY No Risk        Review of Systems:  Review of Systems  Musculoskeletal:  Negative for gait problem.  Neurological:  Negative for tremors.  Psychiatric/Behavioral:         Please refer to HPI    Medications: I have reviewed the patient's current medications.  Current Outpatient Medications  Medication Sig Dispense Refill   cyanocobalamin  (VITAMIN B12) 1000 MCG tablet Take 1,000 mcg by mouth daily.     levonorgestrel  (MIRENA , 52 MG,) 20 MCG/24HR IUD 1 Intra Uterine Device (1 each total) by Intrauterine route once for 1 dose. (Patient taking differently: 1 each by Intrauterine route once. Inserted 08/12/2017 by GYN, needs to be removed 08/2022.) 1 Intra Uterine Device 0   lisdexamfetamine (VYVANSE ) 20 MG capsule Take 1 capsule (20 mg total) by mouth daily. 30 capsule 0   VITAMIN D , ERGOCALCIFEROL , PO Take 5,000 Units by mouth daily in the afternoon.  No current facility-administered medications for this visit.    Medication Side Effects: None  Allergies: No Known Allergies  Past Medical History:  Diagnosis Date   Anemia    Anxiety    Depression    GERD (gastroesophageal reflux disease)    History of Papanicolaou smear of cervix 10/28/12; 08/02/15   NEG; LGSIL, HPV   Immunization, viral disease    GARDASIL COMPLETED   Vitamin D  deficiency 07/2011    Past Medical History, Surgical  history, Social history, and Family history were reviewed and updated as appropriate.   Please see review of systems for further details on the patient's review from today.   Objective:   Physical Exam:  There were no vitals taken for this visit.  Physical Exam Constitutional:      General: She is not in acute distress. Musculoskeletal:        General: No deformity.  Neurological:     Mental Status: She is alert and oriented to person, place, and time.     Coordination: Coordination normal.  Psychiatric:        Attention and Perception: Attention and perception normal. She does not perceive auditory or visual hallucinations.        Mood and Affect: Mood normal. Mood is not anxious or depressed. Affect is not labile, blunt, angry or inappropriate.        Speech: Speech normal.        Behavior: Behavior normal.        Thought Content: Thought content normal. Thought content is not paranoid or delusional. Thought content does not include homicidal or suicidal ideation. Thought content does not include homicidal or suicidal plan.        Cognition and Memory: Cognition and memory normal.        Judgment: Judgment normal.     Comments: Insight intact     Lab Review:     Component Value Date/Time   NA 137 01/02/2023 1348   NA 141 02/11/2018 1624   K 3.9 01/02/2023 1348   CL 102 01/02/2023 1348   CO2 27 01/02/2023 1348   GLUCOSE 92 01/02/2023 1348   BUN 10 01/02/2023 1348   BUN 8 02/11/2018 1624   CREATININE 0.60 01/02/2023 1348   CREATININE 0.65 06/12/2017 1010   CALCIUM 9.5 01/02/2023 1348   PROT 7.0 01/02/2023 1348   PROT 7.2 02/11/2018 1624   ALBUMIN 4.3 01/02/2023 1348   ALBUMIN 4.5 02/11/2018 1624   AST 14 01/02/2023 1348   ALT 11 01/02/2023 1348   ALKPHOS 56 01/02/2023 1348   BILITOT 0.7 01/02/2023 1348   BILITOT 0.2 02/11/2018 1624   GFRNONAA 121 02/11/2018 1624   GFRAA 140 02/11/2018 1624       Component Value Date/Time   WBC 5.8 01/02/2023 1348   RBC 4.12  01/02/2023 1348   HGB 13.3 01/02/2023 1348   HGB 13.3 02/11/2018 1624   HCT 40.4 01/02/2023 1348   HCT 39.1 02/11/2018 1624   PLT 293.0 01/02/2023 1348   PLT 336 02/11/2018 1624   MCV 98.2 01/02/2023 1348   MCV 96 02/11/2018 1624   MCH 32.6 02/11/2018 1624   MCH 32.1 06/12/2017 1010   MCHC 33.0 01/02/2023 1348   RDW 12.7 01/02/2023 1348   RDW 12.4 02/11/2018 1624   LYMPHSABS 1.4 01/02/2023 1348   LYMPHSABS 1.8 02/11/2018 1624   MONOABS 0.5 01/02/2023 1348   EOSABS 0.0 01/02/2023 1348   EOSABS 0.0 02/11/2018 1624   BASOSABS 0.0 01/02/2023 1348  BASOSABS 0.0 02/11/2018 1624    No results found for: POCLITH, LITHIUM   No results found for: PHENYTOIN, PHENOBARB, VALPROATE, CBMZ   .res Assessment: Plan:   Plan:  PDMP reviewed  Add Vyvanse  20mg  for the next 4 weeks - will call for refills if helpful and return in 3 months.  Monitor BP between visits while taking stimulant medication.   Completed [facility] ADHD testing 47/58 - ADHD likely   Meets DSM diagnostic criteria for ADHD diagnosis  RTC 3 months  Patient advised to contact office with any questions, adverse effects, or acute worsening in signs and symptoms.  Discussed potential benefits, risks, and side effects of stimulants with patient to include increased heart rate, palpitations, insomnia, increased anxiety, increased irritability, or decreased appetite.  Instructed patient to contact office if experiencing any significant tolerability issues.   There are no diagnoses linked to this encounter.   Please see After Visit Summary for patient specific instructions.  Future Appointments  Date Time Provider Department Center  05/10/2023 10:20 AM Colman Birdwell Nattalie, NP [facility] None    No orders of the defined types were placed in this encounter.   -------------------------------

## 2023-05-14 ENCOUNTER — Other Ambulatory Visit: Payer: Self-pay

## 2023-05-14 ENCOUNTER — Telehealth: Payer: Self-pay | Admitting: Genetic Counselor

## 2023-05-14 NOTE — Telephone Encounter (Signed)
 Rescheduled appointments per Voicemail. Patient is aware of the updated changes .

## 2023-05-16 ENCOUNTER — Telehealth: Payer: Self-pay

## 2023-05-16 NOTE — Telephone Encounter (Signed)
 Prior Authorization Lisdexamfetamine Dimesylate 20MG  capsules #30/30 [employer]  Approved Effective:  05/15/23-05/14/24

## 2023-05-17 ENCOUNTER — Other Ambulatory Visit (HOSPITAL_COMMUNITY): Payer: Self-pay

## 2023-05-20 ENCOUNTER — Other Ambulatory Visit (HOSPITAL_COMMUNITY): Payer: Self-pay

## 2023-05-21 NOTE — Therapy (Signed)
 OUTPATIENT PHYSICAL THERAPY  THORACOLUMBAR EVALUATION AND DISCHARGE   Patient Name: Briana Reed MRN: [id number] DOB:1986/04/02, 38 y.o., female Today's Date: 05/23/2023  END OF SESSION:  PT End of Session - 05/22/23 1422     Visit Number 1    Number of Visits 1    Authorization Type [employer]    PT Start Time 1415    PT Stop Time 1500    PT Time Calculation (min) 45 min    Activity Tolerance Patient tolerated treatment well    Behavior During Therapy [facility] for tasks assessed/performed             Past Medical History:  Diagnosis Date   Anemia    Anxiety    Depression    GERD (gastroesophageal reflux disease)    History of Papanicolaou smear of cervix 10/28/12; 08/02/15   NEG; LGSIL, HPV   Immunization, viral disease    GARDASIL COMPLETED   Vitamin D  deficiency 07/2011   Past Surgical History:  Procedure Laterality Date   ESOPHAGOGASTRODUODENOSCOPY  2008   WNL   Patient Active Problem List   Diagnosis Date Noted   Anxiety state 12/02/2018   Mild recurrent major depression (HCC) 10/06/2018   Herpetic whitlow 12/26/2017   LGSIL on Pap smear of cervix 10/14/2017   Cervical high risk human papillomavirus (HPV) DNA test positive 08/05/2017    PCP: Alexander Iba PA   REFERRING PROVIDER: Alexander Iba, PA  REFERRING DIAG: [phone] (ICD-10-CM) - Chronic bilateral low back pain without sciatica  Rationale for Evaluation and Treatment: Rehabilitation  THERAPY DIAG:  Other low back pain  ONSET DATE: Aug   SUBJECTIVE:                                                                                                                                                                                           SUBJECTIVE STATEMENT: Pt presents with occasional mid and low back pain on Rt side. Pt had an episode of pain back in August and had an XR which showed scoliosis.  She wanted to come to PT to see if she could get ahead of it.  She complains of pain if she  were to lay on her side. Reports pinching mid back/Rt side.  Pain in Rt iliac crest.  Pain is not bad.  I'm aware of it.  It has been off and on for months. If I sit a long time Rt hip hurts.   Fairly constant Rt rib, mid back pain  She has some tightness in R shoulder not sure if that is part of it.   PERTINENT HISTORY:  M54.50,G89.29 (ICD-10-CM) -  Chronic bilateral low back pain without sciatica  Alexander Iba, PA  PAIN:  Are you having pain? Yes: NPRS scale: 1/10  Pain location: Rt mid to low back and hip  Pain description: tigh, pinchy  Aggravating factors: sitting Relieving factors: changing positions   PRECAUTIONS: None  RED FLAGS: None   WEIGHT BEARING RESTRICTIONS: No  FALLS:  Has patient fallen in last 6 months? 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   PLOF: Independent  PATIENT GOALS: Get stronger  NEXT MD VISIT: As needed   OBJECTIVE:  Note: Objective measures were completed at Evaluation unless otherwise noted.  DIAGNOSTIC FINDINGS:  Done at Urgent Care   PATIENT SURVEYS:  FOTO NT 1 x visit   COGNITION: Overall cognitive status: Within functional limits for tasks assessed     SENSATION: WFL  MUSCLE LENGTH: WNL   POSTURE: rounded shoulders, forward head, and right pelvic obliquityRt side slightly higher   PALPATION: Min TTP Rt mid thoracic paraspinals   LUMBAR ROM:   AROM eval  Flexion WNL  Extension WNL  Right lateral flexion WNL  Left lateral flexion WNL  Right rotation WNL  Left rotation WNL    (Blank rows = not tested)  LOWER EXTREMITY ROM:   WNL   Active  Right eval Left eval  Hip flexion    Hip extension    Hip abduction    Hip adduction    Hip internal rotation    Hip external rotation    Knee flexion    Knee extension     Ankle dorsiflexion    Ankle plantarflexion    Ankle inversion    Ankle eversion     (Blank rows = not tested)  LOWER EXTREMITY MMT:  WNL  MMT Right eval Left eval  Hip flexion    Hip extension    Hip abduction    Hip adduction    Hip internal rotation    Hip external rotation    Knee flexion    Knee extension    Ankle dorsiflexion    Ankle plantarflexion    Ankle inversion    Ankle eversion     (Blank rows = not tested)  LUMBAR SPECIAL TESTS:  Straight leg raise test: Negative and Trendelenburg sign: Negative  FUNCTIONAL TESTS:   LLD Rt leg appears longer in supine 1/4 inch at most    GAIT: Distance walked: 150 Assistive device utilized: None Level of assistance: Complete Independence Comments: no issues   TREATMENT DATE: 05/22/23                                                                                                                                 PATIENT EDUCATION:  Education details: LLD, resources for exercise, HEP, safe core Person educated:  Patient Education method: Explanation, Demonstration, Verbal cues, and Handouts Education comprehension: verbalized understanding and returned demonstration  HOME EXERCISE PROGRAM: Access Code: [email or web address] URL: [email or web address] Date: 05/22/2023 Prepared by: Marci Setter  Exercises - Child's Pose with Sidebending  - 1 x daily - 7 x weekly - 1 sets - 3 reps - 30 hold - Quadruped Thoracic Rotation Full Range with Hand on Neck  - 1 x daily - 7 x weekly - 2 sets - 10 reps - 10 hold - Sidelying Thoracic Rotation with Open Book  - 1 x daily - 7 x weekly - 2 sets - 10 reps - 10 hold - Quadruped Pelvic Floor Contraction with Opposite Arm and Leg Lift  - 1 x daily - 7 x weekly - 2 sets - 10 reps - 5 hold  ASSESSMENT:  CLINICAL IMPRESSION: Patient is a 38 y.o. female who was seen today for physical therapy evaluation and treatment for scoliosis and intermittent back, hip pain .  She was happy with  receiving home exercise program and will complete the exercises at home on her own in addition to her yoga practice.   OBJECTIVE IMPAIRMENTS: decreased mobility.   ACTIVITY LIMITATIONS: carrying, lifting, bending, and squatting  PARTICIPATION LIMITATIONS: occupation  PERSONAL FACTORS: Profession and Time since onset of injury/illness/exacerbation are also affecting patient's functional outcome.   REHAB POTENTIAL: Excellent  CLINICAL DECISION MAKING: Stable/uncomplicated  EVALUATION COMPLEXITY: Low   GOALS: Goals reviewed with patient? Yes  SHORT TERM GOALS+ LTGs Target date: 05/22/23  Patient will be given a home exercise program and be able to demonstrate Baseline: met  Goal status: INITIAL  2.  Patient will be educated in safety with lifting and body mechanics Baseline: met  Goal status: INITIAL   PLAN:  PT FREQUENCY: one time visit  PT DURATION: other: 1  PLANNED INTERVENTIONS: 97110-Therapeutic exercises and 16109- Self Care.  PLAN FOR NEXT SESSION: NA, DC    Butch Otterson, PT 05/23/2023, 8:44 AM

## 2023-05-22 ENCOUNTER — Encounter: Payer: Self-pay | Admitting: Physical Therapy

## 2023-05-22 ENCOUNTER — Ambulatory Visit: Payer: Commercial Managed Care - HMO | Attending: Physician Assistant | Admitting: Physical Therapy

## 2023-05-22 DIAGNOSIS — M5459 Other low back pain: Secondary | ICD-10-CM | POA: Diagnosis present

## 2023-05-22 DIAGNOSIS — G8929 Other chronic pain: Secondary | ICD-10-CM | POA: Insufficient documentation

## 2023-05-22 DIAGNOSIS — M545 Low back pain, unspecified: Secondary | ICD-10-CM | POA: Diagnosis not present

## 2023-06-18 ENCOUNTER — Other Ambulatory Visit: Payer: Self-pay | Admitting: Adult Health

## 2023-06-18 ENCOUNTER — Other Ambulatory Visit (HOSPITAL_COMMUNITY): Payer: Self-pay

## 2023-06-18 DIAGNOSIS — F909 Attention-deficit hyperactivity disorder, unspecified type: Secondary | ICD-10-CM

## 2023-06-18 MED ORDER — LISDEXAMFETAMINE DIMESYLATE 20 MG PO CAPS
20.0000 mg | ORAL_CAPSULE | Freq: Every day | ORAL | 0 refills | Status: DC
Start: 2023-06-18 — End: 2023-07-24
  Filled 2023-06-18: qty 30, 30d supply, fill #0

## 2023-06-18 NOTE — Telephone Encounter (Signed)
Lf 1/10 lv 1/3 rtc 3 months

## 2023-06-21 ENCOUNTER — Other Ambulatory Visit (HOSPITAL_COMMUNITY): Payer: Self-pay

## 2023-07-24 ENCOUNTER — Other Ambulatory Visit: Payer: Self-pay | Admitting: Adult Health

## 2023-07-24 DIAGNOSIS — F909 Attention-deficit hyperactivity disorder, unspecified type: Secondary | ICD-10-CM

## 2023-07-25 ENCOUNTER — Other Ambulatory Visit (HOSPITAL_COMMUNITY): Payer: Self-pay

## 2023-07-25 MED ORDER — LISDEXAMFETAMINE DIMESYLATE 20 MG PO CAPS
20.0000 mg | ORAL_CAPSULE | Freq: Every day | ORAL | 0 refills | Status: DC
  Filled 2023-07-25: qty 25, 25d supply, fill #0
  Filled 2023-07-25: qty 5, 5d supply, fill #0

## 2023-07-29 ENCOUNTER — Encounter: Payer: Self-pay | Admitting: Genetic Counselor

## 2023-07-30 ENCOUNTER — Other Ambulatory Visit: Payer: Self-pay | Admitting: Genetic Counselor

## 2023-07-30 ENCOUNTER — Encounter: Payer: Self-pay | Admitting: Genetic Counselor

## 2023-07-30 ENCOUNTER — Inpatient Hospital Stay: Payer: 59 | Attending: Genetic Counselor | Admitting: Genetic Counselor

## 2023-07-30 ENCOUNTER — Inpatient Hospital Stay: Payer: 59

## 2023-07-30 ENCOUNTER — Other Ambulatory Visit: Payer: Self-pay

## 2023-07-30 DIAGNOSIS — Z809 Family history of malignant neoplasm, unspecified: Secondary | ICD-10-CM

## 2023-07-30 DIAGNOSIS — Z808 Family history of malignant neoplasm of other organs or systems: Secondary | ICD-10-CM | POA: Diagnosis not present

## 2023-07-30 DIAGNOSIS — Z803 Family history of malignant neoplasm of breast: Secondary | ICD-10-CM | POA: Diagnosis not present

## 2023-07-30 DIAGNOSIS — Z8049 Family history of malignant neoplasm of other genital organs: Secondary | ICD-10-CM | POA: Insufficient documentation

## 2023-07-30 LAB — GENETIC SCREENING ORDER

## 2023-07-30 NOTE — Progress Notes (Signed)
 REFERRING PROVIDER: Jarold Motto, PA [address] [place],  [place] [address]  PRIMARY PROVIDER:  Jarold Motto, [place]  PRIMARY REASON FOR VISIT:  1. Family history of melanoma   2. Family history of breast cancer   3. Family history of uterine cancer      HISTORY OF PRESENT ILLNESS:   Ms. Volpe, a 38 y.o. female, was seen for a [facility] cancer genetics consultation at the request of Jarold Motto, [place] due to a family history of cancer.  Ms. Atlas presents to clinic today to discuss the possibility of a hereditary predisposition to cancer, genetic testing, and to further clarify her future cancer risks, as well as potential cancer risks for family members.   Ms. Sosa is a 38 y.o. female with no personal history of cancer.    CANCER HISTORY:  Oncology History   No history exists.     RISK FACTORS:  Menarche was at age 50.  OCP use for approximately 10 years.  Ovaries intact: yes.  Hysterectomy: no.  Menopausal status: premenopausal.  HRT use: 0 years. Colonoscopy: no; not examined. Mammogram within the last year: no. Number of breast biopsies: 0. Up to date with pelvic exams: yes. Any excessive radiation exposure in the past: no  Past Medical History:  Diagnosis Date   Anemia    Anxiety    Depression    Family history of breast cancer    Family history of melanoma    Family history of uterine cancer    GERD (gastroesophageal reflux disease)    History of Papanicolaou smear of cervix 10/28/12; 08/02/15   NEG; LGSIL, HPV   Immunization, viral disease    GARDASIL COMPLETED   Vitamin D deficiency 07/2011    Past Surgical History:  Procedure Laterality Date   ESOPHAGOGASTRODUODENOSCOPY  2008   WNL    Social History   Socioeconomic History   Marital status: Single    Spouse name: Not on file   Number of children: 0   Years of education: 14   Highest education level: Associate degree: occupational, Scientist, product/process development, or vocational program   Occupational History   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  Other Topics Concern   Not on file  Social History Narrative   She works as a Leisure centre manager    Worried about her mother, getting treatment for endometrial cancer   She was married previously from 2013 till 2016 - divorced since 2017   Social Drivers of Health   Financial Resource Strain: Low Risk  (06/12/2017)   Overall Financial Resource Strain (CARDIA)    Difficulty of Paying Living Expenses: Not very hard  Food Insecurity: No Food Insecurity (06/12/2017)   Hunger Vital Sign    Worried About Running Out of Food in the Last Year: Never true    Ran Out of Food in the Last Year: Never true  Transportation Needs: No Transportation Needs (06/12/2017)   PRAPARE - Administrator, Civil Service (Medical): No    Lack of Transportation (Non-Medical):  No  Physical Activity: Sufficiently Active (06/12/2017)   Exercise Vital Sign    Days of Exercise per Week: 3 days    Minutes of Exercise per Session: 60 min  Stress: Stress Concern Present (06/12/2017)   [employer] of Occupational Health - Occupational Stress Questionnaire    Feeling of Stress : Very much  Social Connections: Somewhat Isolated (06/12/2017)   Social Connection and Isolation Panel [NHANES]    Frequency of Communication with Friends and Family: More than three times a week    Frequency of Social Gatherings with Friends and Family: More than three times a week    Attends Religious Services: Never    Database administrator or Organizations: Yes    Attends Engineer, structural: More  than 4 times per year    Marital Status: Divorced     FAMILY HISTORY:  We obtained a detailed, 4-generation family history.  Significant diagnoses are listed below: Family History  Problem Relation Age of Onset   Melanoma Mother 26       MELANOMA   Hypertension Mother    Cervical cancer Mother 85   Uterine cancer Mother 20   Hyperlipidemia Mother    Mitral valve prolapse Mother    Diabetes Mother        pre-diabetes   Hypertension Father    Stroke Father    Hyperlipidemia Father    Colon polyps Father    Skin cancer Father    Depression Sister    Colon polyps Sister    Skin cancer Paternal Aunt    Lung cancer Maternal Grandmother 71   Uterine cancer Maternal Grandmother 86   Heart attack Maternal Grandfather        early 69s   Heart attack Paternal Grandmother    Heart attack Paternal Grandfather    Skin cancer Paternal Grandfather    Stomach cancer Neg Hx    Colon cancer Neg Hx    Esophageal cancer Neg Hx    Pancreatic cancer Neg Hx      The patient does not have cancer.  She has a sister who is cancer free.  Her parents are living.  The patient's mother had melanoma at 81 and uterine/cervical cancer at 88. Sh has one sister who is cancer free. The maternal grandmother had uterine cancer at 76 and lung cancer.  She had a sister with breast cancer.  The patient's father has skin cancer.  He has  brother and sister, the sister had skin cancer.  His father also had skin cancer.  Ms. Dieguez is unaware of previous family history of genetic testing for hereditary cancer risks. There is no reported Ashkenazi Jewish ancestry. There is no known consanguinity.  GENETIC COUNSELING ASSESSMENT: Ms. Perine is a 38 y.o. female with a family history of cancer which is somewhat suggestive of a hereditary cancer syndrome and predisposition to cancer given the number of cancers in the patient's mother and her young age of onset of uterine cancer. We, therefore, discussed and  recommended the following at today's visit.   DISCUSSION: We discussed that, in general, most cancer is not inherited in families, but instead is sporadic or familial. Sporadic cancers occur by chance and typically happen at older ages (>50 years) as this type of cancer is caused by genetic changes acquired during an individual's lifetime. Some families have more cancers than would be expected by chance; however, the ages or types of cancer are not consistent with a known genetic mutation or  known genetic mutations have been ruled out. This type of familial cancer is thought to be due to a combination of multiple genetic, environmental, hormonal, and lifestyle factors. While this combination of factors likely increases the risk of cancer, the exact source of this risk is not currently identifiable or testable.  We discussed that 5 - 10% of cancer is hereditary, with most cases of uterine cancer associated with Lynch syndrome.  There are other genes that can be associated with hereditary uterine cancer syndromes.  These include PTEN and others.  We discussed that testing is beneficial for several reasons including knowing how to follow individuals and understand if other family members could be at risk for cancer and allow them to undergo genetic testing.   We reviewed the characteristics, features and inheritance patterns of hereditary cancer syndromes. We also discussed genetic testing, including the appropriate family members to test, the process of testing, insurance coverage and turn-around-time for results. We discussed the implications of a negative, positive, carrier and/or variant of uncertain significant result. Ms. Woehrle  was offered a common hereditary cancer panel (36+ genes) and an expanded pan-cancer panel (70+ genes). Ms. Edmonds was informed of the benefits and limitations of each panel, including that expanded pan-cancer panels contain genes that do not have clear management guidelines at  this point in time.  We also discussed that as the number of genes included on a panel increases, the chances of variants of uncertain significance increases. Ms. Savell decided to pursue genetic testing for the CancerNext-Expanded+RNAinsight gene panel.   The CancerNext-Expanded gene panel offered by [facility] and includes sequencing, rearrangement, and RNA analysis for the following 76 genes: AIP, ALK, APC, ATM, AXIN2, BAP1, BARD1, BMPR1A, BRCA1, BRCA2, BRIP1, CDC73, CDH1, CDK4, CDKN1B, CDKN2A, CEBPA, CHEK2, CTNNA1, DDX41, DICER1, ETV6, FH, FLCN, GATA2, LZTR1, MAX, MBD4, MEN1, MET, MLH1, MSH2, MSH3, MSH6, MUTYH, NF1, NF2, NTHL1, PALB2, PHOX2B, PMS2, POT1, PRKAR1A, PTCH1, PTEN, RAD51C, RAD51D, RB1, RET, RUNX1, SDHA, SDHAF2, SDHB, SDHC, SDHD, SMAD4, SMARCA4, SMARCB1, SMARCE1, STK11, SUFU, TMEM127, TP53, TSC1, TSC2, VHL, and WT1 (sequencing and deletion/duplication); EGFR, HOXB13, KIT, MITF, PDGFRA, POLD1, and POLE (sequencing only); EPCAM and GREM1 (deletion/duplication only).    Based on Ms. Xiao's family history of cancer, she would like to undergo genetic testing. Though Ms. Driscoll is not personally affected, there are no affected family members that are willing/able/available to undergo hereditary cancer testing.  Therefore, Ms. McGowanis the most informative family member available.  Despite that she meets criteria, she may still have an out of pocket cost. We discussed that if her out of pocket cost for testing is over $100, the laboratory will call and confirm whether she wants to proceed with testing.  If the out of pocket cost of testing is less than $100 she will be billed by the genetic testing laboratory.   We discussed that some people do not want to undergo genetic testing due to fear of genetic discrimination.  The Genetic Information Nondiscrimination Act (GINA) was signed into federal law in 2008. GINA prohibits health insurers and most employers from discriminating against  individuals based on genetic information (including the results of genetic tests and family history information). According to GINA, health insurance companies cannot consider genetic information to be a preexisting condition, nor can they use it to make decisions regarding coverage or rates. GINA also makes it illegal for most employers to use genetic information in making decisions about hiring, firing, promotion, or terms of employment. It is important to note  that GINA does not offer protections for life insurance, disability insurance, or long-term care insurance. GINA does not apply to those in the [employer], those who work for companies with less than 15 employees, and new life insurance or long-term disability insurance policies.  Health status due to a cancer diagnosis is not protected under GINA. More information about GINA can be found by visiting [email or web address].  PLAN: After considering the risks, benefits, and limitations, Ms. Dishon provided informed consent to pursue genetic testing and the blood sample was sent to [facility] for analysis of the CancerNext-Expanded+RNAinsight. Results should be available within approximately 2-3 weeks' time, at which point they will be disclosed by telephone to Ms. Dunnavant, as will any additional recommendations warranted by these results. Ms. Comrie will receive a summary of her genetic counseling visit and a copy of her results once available. This information will also be available in Epic.   Lastly, we encouraged Ms. Cadmus to remain in contact with cancer genetics annually so that we can continuously update the family history and inform her of any changes in cancer genetics and testing that may be of benefit for this family.   Ms. Lobb questions were answered to her satisfaction today. Our contact information was provided should additional questions or concerns arise. Thank you for the referral and allowing [place] to share in the care  of your patient.   Brigida Scotti P. Lowell Guitar, MS, CGC Licensed, Patent attorney [email or web address] phone: [phone]  60 minutes were spent on the date of the encounter in service to the patient including preparation, face-to-face consultation, documentation and care coordination.  The patient was seen alone.  Drs. Meliton Rattan, and/or [place] were available for questions, if needed..    _______________________________________________________________________ For Office Staff:  Number of people involved in session: 1 Was an Intern/ student involved with case: no

## 2023-08-01 ENCOUNTER — Encounter: Payer: Self-pay | Admitting: Adult Health

## 2023-08-01 ENCOUNTER — Other Ambulatory Visit (HOSPITAL_COMMUNITY): Payer: Self-pay

## 2023-08-01 ENCOUNTER — Telehealth: Admitting: Adult Health

## 2023-08-01 DIAGNOSIS — F909 Attention-deficit hyperactivity disorder, unspecified type: Secondary | ICD-10-CM | POA: Diagnosis not present

## 2023-08-01 MED ORDER — LISDEXAMFETAMINE DIMESYLATE 30 MG PO CAPS
30.0000 mg | ORAL_CAPSULE | Freq: Every day | ORAL | 0 refills | Status: DC
Start: 2023-08-01 — End: 2024-02-13
  Filled 2023-08-01: qty 30, 30d supply, fill #0

## 2023-08-01 NOTE — Progress Notes (Signed)
 Briana Reed [id number] Jul 05, 1985 38 y.o.  Virtual Visit via Video Note  I connected with pt @ on 08/01/23 at  5:00 PM EDT by a video enabled telemedicine application and verified that I am speaking with the correct person using two identifiers.   I discussed the limitations of evaluation and management by telemedicine and the availability of in person appointments. The patient expressed understanding and agreed to proceed.  I discussed the assessment and treatment plan with the patient. The patient was provided an opportunity to ask questions and all were answered. The patient agreed with the plan and demonstrated an understanding of the instructions.   The patient was advised to call back or seek an in-person evaluation if the symptoms worsen or if the condition fails to improve as anticipated.  I provided 10 minutes of non-face-to-face time during this encounter.  The patient was located at home.  The provider was located at [facility].   Dorothyann Gibbs, NP   Subjective:   Patient ID:  Briana Reed is a 38 y.o. (DOB 06/05/1985) female.  Chief Complaint: No chief complaint on file.   HPI Briana Reed presents for follow-up of ADHD.  Describes mood today as "better". Pleasant. Reports decreased tearfulness. Mood symptoms - reports decreased depression, anxiety and irritability. Reports improved interest and motivation. Denies panic attacks. Reports decreased worry, rumination, and over thinking. Mood has improved. Stating "overall, I feel like I'm doing better". Taking medications as prescribed.  Energy levels have improve. Active, does not have a regular exercise routine.  Enjoys some usual interests and activities. Dating - has a boyfriend. Lives alone with 3 cats and a bearded dragon. Sister and mother in [place]. Father in [place]. Talking to friends. Appetite adequate. Weight stable - 170 to 175 pounds.  Sleeps well most nights. Averages 7 to 9  hours. Focus and concentration improved. Completing tasks. Managing aspects of household. 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 Working on an associates in [employer] - not in school currently. Denies SI or HI. Denies AH or VH. Denies self harm. Denies substance use.  Previous medication trials: Wellbutrin XL, Trintellix  Review of Systems:  Review of Systems  Musculoskeletal:  Negative for gait problem.  Neurological:  Negative for tremors.  Psychiatric/Behavioral:         Please refer to HPI    Medications: I have reviewed the patient's current medications.  Current Outpatient Medications  Medication Sig Dispense Refill   cyanocobalamin (VITAMIN B12) 1000 MCG tablet Take 1,000 mcg by mouth daily.     levonorgestrel (MIRENA, 52 MG,) 20 MCG/24HR IUD 1 Intra Uterine Device (1 each total) by Intrauterine route once for 1 dose. (Patient taking differently: 1 each by Intrauterine route once. Inserted 08/12/2017 by GYN, needs to be removed 08/2022.) 1 Intra Uterine Device 0   lisdexamfetamine (VYVANSE) 30 MG capsule Take 1 capsule (30 mg total) by mouth daily. 30 capsule 0   Magnesium Glycinate 120 MG CAPS Take by mouth.     VITAMIN D, ERGOCALCIFEROL, PO Take 5,000 Units by mouth daily in the afternoon.     No current facility-administered medications for this visit.    Medication Side Effects: None  Allergies: No Known Allergies  Past Medical History:  Diagnosis Date   Anemia    Anxiety    Depression    Family history of breast cancer    Family history of melanoma    Family history of uterine cancer  GERD (gastroesophageal reflux disease)    History of Papanicolaou smear of cervix 10/28/12; 08/02/15   NEG; LGSIL, HPV   Immunization, viral disease    GARDASIL COMPLETED   Vitamin D deficiency 07/2011    Family History  Problem Relation Age of Onset   Melanoma Mother 28       MELANOMA   Hypertension Mother    Cervical cancer Mother 106   Uterine  cancer Mother 60   Hyperlipidemia Mother    Mitral valve prolapse Mother    Diabetes Mother        pre-diabetes   Hypertension Father    Stroke Father    Hyperlipidemia Father    Colon polyps Father    Skin cancer Father    Depression Sister    Colon polyps Sister    Skin cancer Paternal Aunt    Lung cancer Maternal Grandmother 110   Uterine cancer Maternal Grandmother 72   Heart attack Maternal Grandfather        early 75s   Heart attack Paternal Grandmother    Heart attack Paternal Grandfather    Skin cancer Paternal Grandfather    Stomach cancer Neg Hx    Colon cancer Neg Hx    Esophageal cancer Neg Hx    Pancreatic cancer Neg Hx     Social History   Socioeconomic History   Marital status: Single    Spouse name: Not on file   Number of children: 0   Years of education: 14   Highest education level: Associate degree: occupational, Scientist, product/process development, or vocational program  Occupational History   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  Other Topics Concern   Not on file  Social History Narrative   She works as a Leisure centre manager    Worried about her mother, getting treatment for endometrial cancer   She was married previously from 2013 till 2016 - divorced since 2017   Social Drivers of Health   Financial Resource Strain: Low Risk  (06/12/2017)   Overall Financial Resource Strain (CARDIA)    Difficulty of Paying Living Expenses: Not very hard  Food Insecurity: No Food Insecurity (06/12/2017)   Hunger Vital Sign    Worried About Running Out of Food in the Last Year: Never true    Ran Out of Food in the Last Year:  Never true  Transportation Needs: No Transportation Needs (06/12/2017)   PRAPARE - Administrator, Civil Service (Medical): No    Lack of Transportation (Non-Medical): No  Physical Activity: Sufficiently Active (06/12/2017)   Exercise Vital Sign    Days of Exercise per Week: 3 days    Minutes of Exercise per Session: 60 min  Stress: Stress Concern Present (06/12/2017)   [employer] of Occupational Health - Occupational Stress Questionnaire    Feeling of Stress : Very much  Social Connections: Somewhat Isolated (06/12/2017)   Social Connection and Isolation Panel [NHANES]    Frequency of Communication with Friends and Family: More than three times a week  Frequency of Social Gatherings with Friends and Family: More than three times a week    Attends Religious Services: Never    Database administrator or Organizations: Yes    Attends Engineer, structural: More than 4 times per year    Marital Status: Divorced  Intimate Partner Violence: Not At Risk (06/12/2017)   Humiliation, Afraid, Rape, and Kick questionnaire    Fear of Current or Ex-Partner: No    Emotionally Abused: No    Physically Abused: No    Sexually Abused: No    Past Medical History, Surgical history, Social history, and Family history were reviewed and updated as appropriate.   Please see review of systems for further details on the patient's review from today.   Objective:   Physical Exam:  There were no vitals taken for this visit.  Physical Exam Constitutional:      General: She is not in acute distress. Musculoskeletal:        General: No deformity.  Neurological:     Mental Status: She is alert and oriented to person, place, and time.     Coordination: Coordination normal.  Psychiatric:        Attention and Perception: Attention and perception normal. She does not perceive auditory or visual hallucinations.        Mood and Affect: Affect is not labile, blunt, angry or inappropriate.         Speech: Speech normal.        Behavior: Behavior normal.        Thought Content: Thought content normal. Thought content is not paranoid or delusional. Thought content does not include homicidal or suicidal ideation. Thought content does not include homicidal or suicidal plan.        Cognition and Memory: Cognition and memory normal.        Judgment: Judgment normal.     Comments: Insight intact     Lab Review:     Component Value Date/Time   NA 137 01/02/2023 1348   NA 141 02/11/2018 1624   K 3.9 01/02/2023 1348   CL 102 01/02/2023 1348   CO2 27 01/02/2023 1348   GLUCOSE 92 01/02/2023 1348   BUN 10 01/02/2023 1348   BUN 8 02/11/2018 1624   CREATININE 0.60 01/02/2023 1348   CREATININE 0.65 06/12/2017 1010   CALCIUM 9.5 01/02/2023 1348   PROT 7.0 01/02/2023 1348   PROT 7.2 02/11/2018 1624   ALBUMIN 4.3 01/02/2023 1348   ALBUMIN 4.5 02/11/2018 1624   AST 14 01/02/2023 1348   ALT 11 01/02/2023 1348   ALKPHOS 56 01/02/2023 1348   BILITOT 0.7 01/02/2023 1348   BILITOT 0.2 02/11/2018 1624   GFRNONAA 121 02/11/2018 1624   GFRAA 140 02/11/2018 1624       Component Value Date/Time   WBC 5.8 01/02/2023 1348   RBC 4.12 01/02/2023 1348   HGB 13.3 01/02/2023 1348   HGB 13.3 02/11/2018 1624   HCT 40.4 01/02/2023 1348   HCT 39.1 02/11/2018 1624   PLT 293.0 01/02/2023 1348   PLT 336 02/11/2018 1624   MCV 98.2 01/02/2023 1348   MCV 96 02/11/2018 1624   MCH 32.6 02/11/2018 1624   MCH 32.1 06/12/2017 1010   MCHC 33.0 01/02/2023 1348   RDW 12.7 01/02/2023 1348   RDW 12.4 02/11/2018 1624   LYMPHSABS 1.4 01/02/2023 1348   LYMPHSABS 1.8 02/11/2018 1624   MONOABS 0.5 01/02/2023 1348   EOSABS 0.0 01/02/2023 1348   EOSABS 0.0  02/11/2018 1624   BASOSABS 0.0 01/02/2023 1348   BASOSABS 0.0 02/11/2018 1624    No results found for: "POCLITH", "LITHIUM"   No results found for: "PHENYTOIN", "PHENOBARB", "VALPROATE", "CBMZ"   .res Assessment: Plan:    Plan:  PDMP  reviewed  Increase Vyvanse 20mg  to 30mg   Monitor BP between visits while taking stimulant medication.   Completed [facility] testing 47/58 - ADHD likely   Meets DSM diagnostic criteria for ADHD diagnosis  RTC 3 months  10 minutes spent dedicated to the care of this patient on the date of this encounter to include pre-visit review of records, ordering of medication, post visit documentation, and face-to-face time with the patient discussing ADD. Discussed increasing Vvanse 20mg  to 30mg  daily.  Patient advised to contact office with any questions, adverse effects, or acute worsening in signs and symptoms.  Discussed potential benefits, risks, and side effects of stimulants with patient to include increased heart rate, palpitations, insomnia, increased anxiety, increased irritability, or decreased appetite.  Instructed patient to contact office if experiencing any significant tolerability issues.   Diagnoses and all orders for this visit:  Attention deficit hyperactivity disorder (ADHD), unspecified ADHD type -     lisdexamfetamine (VYVANSE) 30 MG capsule; Take 1 capsule (30 mg total) by mouth daily.     Please see After Visit Summary for patient specific instructions.  No future appointments.   No orders of the defined types were placed in this encounter.     -------------------------------

## 2023-08-15 ENCOUNTER — Encounter: Payer: Self-pay | Admitting: Genetic Counselor

## 2023-08-15 ENCOUNTER — Telehealth: Payer: Self-pay | Admitting: Genetic Counselor

## 2023-08-15 ENCOUNTER — Ambulatory Visit: Payer: Self-pay | Admitting: Genetic Counselor

## 2023-08-15 DIAGNOSIS — Z1379 Encounter for other screening for genetic and chromosomal anomalies: Secondary | ICD-10-CM

## 2023-08-15 DIAGNOSIS — Z1589 Genetic susceptibility to other disease: Secondary | ICD-10-CM | POA: Insufficient documentation

## 2023-08-15 NOTE — Telephone Encounter (Signed)
 LM on VM that results are back and to please call.  Left CB instructions.

## 2023-08-15 NOTE — Progress Notes (Signed)
 HPI: Briana Reed was previously seen in the [facility] due to a family of cancer and concerns regarding a hereditary predisposition to cancer. Please refer to our prior cancer genetics clinic note for more information regarding Briana Reed's medical, social and family histories, and our assessment and recommendations, at the time. Briana Reed recent genetic test results were disclosed to her, as were recommendations warranted by these results. These results and recommendations are discussed in more detail below.   FAMILY HISTORY:  We obtained a detailed, 4-generation family history.  Significant diagnoses are listed below: Family History  Problem Relation Age of Onset   Melanoma Mother 29       MELANOMA   Hypertension Mother    Cervical cancer Mother 74   Uterine cancer Mother 44   Hyperlipidemia Mother    Mitral valve prolapse Mother    Diabetes Mother        pre-diabetes   Hypertension Father    Stroke Father    Hyperlipidemia Father    Colon polyps Father    Skin cancer Father    Depression Sister    Colon polyps Sister    Skin cancer Paternal Aunt    Lung cancer Maternal Grandmother 95   Uterine cancer Maternal Grandmother 78   Heart attack Maternal Grandfather        early 11s   Heart attack Paternal Grandmother    Heart attack Paternal Grandfather    Skin cancer Paternal Grandfather    Stomach cancer Neg Hx    Colon cancer Neg Hx    Esophageal cancer Neg Hx    Pancreatic cancer Neg Hx        The patient does not have cancer.  She has a sister who is cancer free.  Her parents are living.   The patient's mother had melanoma at 26 and uterine/cervical cancer at 36. Sh has one sister who is cancer free. The maternal grandmother had uterine cancer at 55 and lung cancer.  She had a sister with breast cancer.   The patient's father has skin cancer.  He has  brother and sister, the sister had skin cancer.  His father also had skin cancer.   Ms.  Rodell is unaware of previous family history of genetic testing for hereditary cancer risks. There is no reported Ashkenazi Jewish ancestry. There is no known consanguinity.  GENETIC TEST RESULTS: At the time of Briana Reed's visit, we recommended she pursue genetic testing of the CancerNext-Expanded+RNAinsight. This test, which included sequencing and deletion/duplication analysis of the genes listed on the test report, was performed at [facility]. Briana Reed was called today with her genetic test results. Genetic testing identified a single, heterozygous pathogenic gene mutation called MUTYH  p.G396D (c.1187G>A).  Since Briana Reed has only one pathogenic mutation in MUTYH, she is NOT affected with MYH-associated polyposis, but instead is a carrier. A copy of the test report has been scanned into Epic and is located under the Molecular Pathology section of the Results Review tab.     Genetic testing did identify a variant of uncertain significance (VUS) was identified in the RUNX1 gene called p.P263S.  At this time, it is unknown if this variant is associated with increased cancer risk or if this is a normal finding, but most variants such as this get reclassified to being inconsequential. It should not be used to make medical management decisions. With time, we suspect the lab will determine the significance of this variant,  if any. If we do learn more about it, we will try to contact [name] to discuss it further. However, it is important to stay in touch with [place] periodically and keep the address and phone number up to date.  DISCUSSION:  About 1-2% of individuals have one pathogenic (harmful) mutation in MUTYH.  Individuals are considered carriers of autosomal recessive MYH-associated polyposis (MAP), but not affected. Several studies have found that individuals with one MUTYH mutation are not at a higher risk for colon polyps. Additionally, there is insufficient evidence that  they are at higher risk for other cancers over the general population.     MANAGEMENT: General population screening is appropriate If there is a personal or first degree family history of colon cancer or polyps (not explained by MAP), screen based on family history.  SCREENING RECOMMENDATIONS: This test result does not explain Briana Reed's family history of cancer. This means that we have not identified a hereditary cause for her family history of cancer at this time. Most cancers happen by chance and this negative test suggests that her family history of cancer may fall into this category.    Possible reasons for [name]'s negative genetic test include:  1. There may be a gene mutation in one of these genes that current testing methods cannot detect but that chance is small.  2. There could be another gene that has not yet been discovered, or that we have not yet tested, that is responsible for the cancer diagnoses in the family.  3.  There may be no hereditary risk for cancer in the family. The cancers in Briana Reed and/or her family may be sporadic/familial or due to other genetic and environmental factors. 4. It is also possible there is a hereditary cause for the cancer in the family that Briana Reed did not inherit.  Therefore, it is recommended she continue to follow the cancer management and screening guidelines provided by her primary healthcare provider. Based on the [employer] (NCCN) Version [phone] Genetic/Familial High Risk Assessment: Colorectal, Endometrial and Gastric Guidelines, population screening is appropriate for MUTYH carriers.  An individual's cancer risk and medical management are not determined by genetic test results alone. Overall cancer risk assessment incorporates additional factors, including personal medical history, family history, and any available genetic information that may result in a personalized plan for cancer prevention and  surveillance   This information is based on current understanding of the gene and may change in the future.  Implications for Family Members: Hereditary predisposition to cancer due to pathogenic variants in the MUTYH gene has autosomal recessive inheritance. This means that, while an individual with a pathogenic variant has a 50% chance of passing the MUTYH mutation on to his/her offspring, they will not be affected unless they inherit a second mutation from their other parent.   Family members are encouraged to consider genetic testing for this familial pathogenic variant. As there are no cancer risks associated with a single pathogenic variant in the MUTYH gene, individuals in the family are not recommended to have testing until they reach at least 37 years of age. They may contact our office at [phone] for more information or to schedule an appointment.  Complimentary testing for the familial variant is available for 90 days.  Family members who live outside of the area are encouraged to find a genetic counselor in their area by visiting: [email or web address].  We strongly encouraged Ms. Rozo to remain in contact with [place] in  cancer genetics on an annual basis so we can update Ms. Albarran's personal and family histories, and inform her of advances in cancer genetics that may be of benefit for the entire family. Ms. Leising knows she is also welcome to call with any questions or concerns, at any time.   Maylon Cos, MS, [facility]  Licensed, Certified Genetic Counselor [email or web address] phone: [phone]

## 2023-08-15 NOTE — Telephone Encounter (Signed)
 Revealed that she is a carrier of MUTYH pathogenic mutation.  This will not increase her risk for cancer, but she is a carrier for MYH-associated polyposis. If she has a child with someone who also has a MUTYH pathogenic variant she would be at risk for having a child who could have colon cancer.  There is also a RUNX1 VUS that will not change her medical management.

## 2023-09-20 ENCOUNTER — Other Ambulatory Visit (HOSPITAL_COMMUNITY): Payer: Self-pay

## 2023-09-20 ENCOUNTER — Other Ambulatory Visit: Payer: Self-pay

## 2023-09-20 ENCOUNTER — Telehealth: Payer: Self-pay | Admitting: Adult Health

## 2023-09-20 MED ORDER — LISDEXAMFETAMINE DIMESYLATE 20 MG PO CAPS
20.0000 mg | ORAL_CAPSULE | Freq: Every day | ORAL | 0 refills | Status: DC
  Filled 2023-09-20: qty 30, 30d supply, fill #0

## 2023-09-20 NOTE — Telephone Encounter (Signed)
 Pt called at 1:35p requesting refill of generic Vyvanse . She asked for refill of 20mg  NOT 30mg .  She would like it sent to   [facility] [address], [place] [address] Phone: [phone]  Fax: [phone]    Next appt 6/26

## 2023-09-20 NOTE — Telephone Encounter (Signed)
 Pended Vyvanse  20 to [facility]

## 2023-10-31 ENCOUNTER — Encounter: Payer: Self-pay | Admitting: Adult Health

## 2023-10-31 ENCOUNTER — Telehealth: Admitting: Adult Health

## 2023-10-31 ENCOUNTER — Other Ambulatory Visit (HOSPITAL_COMMUNITY): Payer: Self-pay

## 2023-10-31 DIAGNOSIS — F909 Attention-deficit hyperactivity disorder, unspecified type: Secondary | ICD-10-CM | POA: Diagnosis not present

## 2023-10-31 MED ORDER — AMPHETAMINE-DEXTROAMPHETAMINE 20 MG PO TABS
20.0000 mg | ORAL_TABLET | Freq: Every day | ORAL | 0 refills | Status: DC
  Filled 2023-10-31: qty 30, 30d supply, fill #0

## 2023-10-31 NOTE — Progress Notes (Signed)
 Briana Reed [id number] [birth year] 38 y.o.  Virtual Visit via Video Note  I connected with pt @ on 10/31/23 at  2:00 PM EDT by a video enabled telemedicine application and verified that I am speaking with the correct person using two identifiers.   I discussed the limitations of evaluation and management by telemedicine and the availability of in person appointments. The patient expressed understanding and agreed to proceed.  I discussed the assessment and treatment plan with the patient. The patient was provided an opportunity to ask questions and all were answered. The patient agreed with the plan and demonstrated an understanding of the instructions.   The patient was advised to call back or seek an in-person evaluation if the symptoms worsen or if the condition fails to improve as anticipated.  I provided 15 minutes of non-face-to-face time during this encounter.  The patient was located at home.  The provider was located at [facility].   Angeline LOISE Sayers, NP   Subjective:   Patient ID:  Briana Reed is a 38 y.o. (DOB 1986/04/20) female.  Chief Complaint: No chief complaint on file.   HPI [name] presents for follow-up of ADHD.  Describes mood today as ok. Pleasant. Reports decreased tearfulness. Mood symptoms - reports situational depression, anxiety and irritability - work related. Reports lower interest and motivation. Denies panic attacks. Reports some worry, rumination and over thinking. Reports mood as variable. Stating I feel like I'm doing ok. Taking medications as prescribed.  Energy levels lower. Active, has a regular exercise routine.  Enjoys some usual interests and activities. Single. Lives alone with 1 cat and a bearded dragon. Sister and mother in [place]. Father in [place] . Talking to friends. Appetite adequate. Weight loss - 168 to 175 pounds.  Sleeps well most nights. Averages 7 to 9 hours. Focus and concentration difficulties.  Completing tasks. Managing aspects of household. 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 Working on an associates in [employer] - not in school currently. Denies SI or HI. Denies AH or VH. Denies self harm. Denies substance use.  Previous medication trials: Wellbutrin  XL, Trintellix    Review of Systems:  Review of Systems  Musculoskeletal:  Negative for gait problem.  Neurological:  Negative for tremors.  Psychiatric/Behavioral:         Please refer to HPI    Medications: I have reviewed the patient's current medications.  Current Outpatient Medications  Medication Sig Dispense Refill   cyanocobalamin  (VITAMIN B12) 1000 MCG tablet Take 1,000 mcg by mouth daily.     levonorgestrel  (MIRENA , 52 MG,) 20 MCG/24HR IUD 1 Intra Uterine Device (1 each total) by Intrauterine route once for 1 dose. (Patient taking differently: 1 each by Intrauterine route once. Inserted 08/12/2017 by GYN, needs to be removed 08/2022.) 1 Intra Uterine Device 0   lisdexamfetamine (VYVANSE ) 20 MG capsule Take 1 capsule (20 mg total) by mouth daily. 30 capsule 0   lisdexamfetamine (VYVANSE ) 30 MG capsule Take 1 capsule (30 mg total) by mouth daily. 30 capsule 0   Magnesium Glycinate 120 MG CAPS Take by mouth.     VITAMIN D , ERGOCALCIFEROL , PO Take 5,000 Units by mouth daily in the afternoon.     No current facility-administered medications for this visit.    Medication Side Effects: None  Allergies: No Known Allergies  Past Medical History:  Diagnosis Date   Anemia    Anxiety    Depression    Family history of breast  cancer    Family history of melanoma    Family history of uterine cancer    GERD (gastroesophageal reflux disease)    History of Papanicolaou smear of cervix 10/28/12; 08/02/15   NEG; LGSIL, HPV   Immunization, viral disease    GARDASIL COMPLETED   Vitamin D  deficiency 07/2011    Family History  Problem Relation Age of Onset   Melanoma Mother 18       MELANOMA    Hypertension Mother    Cervical cancer Mother 72   Uterine cancer Mother 88   Hyperlipidemia Mother    Mitral valve prolapse Mother    Diabetes Mother        pre-diabetes   Hypertension Father    Stroke Father    Hyperlipidemia Father    Colon polyps Father    Skin cancer Father    Depression Sister    Colon polyps Sister    Skin cancer Paternal Aunt    Lung cancer Maternal Grandmother 11   Uterine cancer Maternal Grandmother 66   Heart attack Maternal Grandfather        early 76s   Heart attack Paternal Grandmother    Heart attack Paternal Grandfather    Skin cancer Paternal Grandfather    Stomach cancer Neg Hx    Colon cancer Neg Hx    Esophageal cancer Neg Hx    Pancreatic cancer Neg Hx     Social History   Socioeconomic History   Marital status: Single    Spouse name: Not on file   Number of children: 0   Years of education: 14   Highest education level: Associate degree: occupational, Scientist, product/process development, or vocational program  Occupational History   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  Other Topics Concern   Not on file  Social History Narrative   She works as a Leisure centre manager    Worried about her mother, getting treatment for endometrial cancer   She was married previously from 2013 till 2016 - divorced since 2017   Social Drivers of Health   Financial Resource Strain: Low Risk  (06/12/2017)   Overall Financial Resource Strain (CARDIA)    Difficulty of Paying Living Expenses: Not very hard  Food Insecurity: No Food Insecurity (06/12/2017)   Hunger Vital Sign    Worried About Running Out of Food in  the Last Year: Never true    Ran Out of Food in the Last Year: Never true  Transportation Needs: No Transportation Needs (06/12/2017)   PRAPARE - Administrator, Civil Service (Medical): No    Lack of Transportation (Non-Medical): No  Physical Activity: Sufficiently Active (06/12/2017)   Exercise Vital Sign    Days of Exercise per Week: 3 days    Minutes of Exercise per Session: 60 min  Stress: Stress Concern Present (06/12/2017)   [employer] of Occupational Health - Occupational Stress Questionnaire    Feeling of Stress : Very much  Social Connections: Somewhat Isolated (06/12/2017)   Social Connection and Isolation Panel  Frequency of Communication with Friends and Family: More than three times a week    Frequency of Social Gatherings with Friends and Family: More than three times a week    Attends Religious Services: Never    Database administrator or Organizations: Yes    Attends Engineer, structural: More than 4 times per year    Marital Status: Divorced  Intimate Partner Violence: Not At Risk (06/12/2017)   Humiliation, Afraid, Rape, and Kick questionnaire    Fear of Current or Ex-Partner: No    Emotionally Abused: No    Physically Abused: No    Sexually Abused: No    Past Medical History, Surgical history, Social history, and Family history were reviewed and updated as appropriate.   Please see review of systems for further details on the patient's review from today.   Objective:   Physical Exam:  There were no vitals taken for this visit.  Physical Exam Constitutional:      General: She is not in acute distress.  Musculoskeletal:        General: No deformity.   Neurological:     Mental Status: She is alert and oriented to person, place, and time.     Coordination: Coordination normal.   Psychiatric:        Attention and Perception: Attention and perception normal. She does not perceive auditory or visual hallucinations.        Mood and  Affect: Mood normal. Mood is not anxious or depressed. Affect is not labile, blunt, angry or inappropriate.        Speech: Speech normal.        Behavior: Behavior normal.        Thought Content: Thought content normal. Thought content is not paranoid or delusional. Thought content does not include homicidal or suicidal ideation. Thought content does not include homicidal or suicidal plan.        Cognition and Memory: Cognition and memory normal.        Judgment: Judgment normal.     Comments: Insight intact     Lab Review:     Component Value Date/Time   NA 137 01/02/2023 1348   NA 141 02/11/2018 1624   K 3.9 01/02/2023 1348   CL 102 01/02/2023 1348   CO2 27 01/02/2023 1348   GLUCOSE 92 01/02/2023 1348   BUN 10 01/02/2023 1348   BUN 8 02/11/2018 1624   CREATININE 0.60 01/02/2023 1348   CREATININE 0.65 06/12/2017 1010   CALCIUM 9.5 01/02/2023 1348   PROT 7.0 01/02/2023 1348   PROT 7.2 02/11/2018 1624   ALBUMIN 4.3 01/02/2023 1348   ALBUMIN 4.5 02/11/2018 1624   AST 14 01/02/2023 1348   ALT 11 01/02/2023 1348   ALKPHOS 56 01/02/2023 1348   BILITOT 0.7 01/02/2023 1348   BILITOT 0.2 02/11/2018 1624   GFRNONAA 121 02/11/2018 1624   GFRAA 140 02/11/2018 1624       Component Value Date/Time   WBC 5.8 01/02/2023 1348   RBC 4.12 01/02/2023 1348   HGB 13.3 01/02/2023 1348   HGB 13.3 02/11/2018 1624   HCT 40.4 01/02/2023 1348   HCT 39.1 02/11/2018 1624   PLT 293.0 01/02/2023 1348   PLT 336 02/11/2018 1624   MCV 98.2 01/02/2023 1348   MCV 96 02/11/2018 1624   MCH 32.6 02/11/2018 1624   MCH 32.1 06/12/2017 1010   MCHC 33.0 01/02/2023 1348   RDW 12.7 01/02/2023 1348   RDW 12.4 02/11/2018 1624  LYMPHSABS 1.4 01/02/2023 1348   LYMPHSABS 1.8 02/11/2018 1624   MONOABS 0.5 01/02/2023 1348   EOSABS 0.0 01/02/2023 1348   EOSABS 0.0 02/11/2018 1624   BASOSABS 0.0 01/02/2023 1348   BASOSABS 0.0 02/11/2018 1624    No results found for: POCLITH, LITHIUM   No results  found for: PHENYTOIN, PHENOBARB, VALPROATE, CBMZ   .res Assessment: Plan:    Plan:  PDMP reviewed  Add Adderall 20mg  daily - will call in a month if wanting to continue - if so ok 2 send in 2 more scripts.  D/C Vyvanse  20mg  daily  Monitor BP between visits while taking stimulant medication.   Completed [facility] testing 47/58 - ADHD likely   Meets DSM diagnostic criteria for ADHD diagnosis  RTC 3 months  15 minutes spent dedicated to the care of this patient on the date of this encounter to include pre-visit review of records, ordering of medication, post visit documentation, and face-to-face time with the patient discussing ADD. Discussed increasing Vvanse 20mg  to 30mg  daily.  Patient advised to contact office with any questions, adverse effects, or acute worsening in signs and symptoms.  Discussed potential benefits, risks, and side effects of stimulants with patient to include increased heart rate, palpitations, insomnia, increased anxiety, increased irritability, or decreased appetite.  Instructed patient to contact office if experiencing any significant tolerability issues.  There are no diagnoses linked to this encounter.   Please see After Visit Summary for patient specific instructions.  No future appointments.  No orders of the defined types were placed in this encounter.     -------------------------------

## 2023-12-03 ENCOUNTER — Other Ambulatory Visit: Payer: Self-pay

## 2023-12-03 ENCOUNTER — Other Ambulatory Visit (HOSPITAL_COMMUNITY): Payer: Self-pay

## 2023-12-03 ENCOUNTER — Other Ambulatory Visit: Payer: Self-pay | Admitting: Adult Health

## 2023-12-03 DIAGNOSIS — F909 Attention-deficit hyperactivity disorder, unspecified type: Secondary | ICD-10-CM

## 2023-12-03 MED ORDER — AMPHETAMINE-DEXTROAMPHETAMINE 20 MG PO TABS: 20.0000 mg | ORAL_TABLET | Freq: Every day | ORAL | 0 refills | Status: DC

## 2023-12-03 MED ORDER — AMPHETAMINE-DEXTROAMPHETAMINE 20 MG PO TABS
20.0000 mg | ORAL_TABLET | Freq: Every day | ORAL | 0 refills | Status: DC
  Filled 2023-12-03: qty 30, 30d supply, fill #0

## 2023-12-03 MED ORDER — METRONIDAZOLE 0.75 % EX CREA
TOPICAL_CREAM | Freq: Two times a day (BID) | CUTANEOUS | 3 refills | Status: AC
  Filled 2023-12-03: qty 45, 30d supply, fill #0
  Filled 2024-03-20: qty 45, 30d supply, fill #1
  Filled 2024-06-09: qty 45, 30d supply, fill #2

## 2023-12-04 ENCOUNTER — Other Ambulatory Visit (HOSPITAL_COMMUNITY): Payer: Self-pay

## 2023-12-09 ENCOUNTER — Other Ambulatory Visit (HOSPITAL_COMMUNITY): Payer: Self-pay

## 2023-12-09 MED ORDER — NORETHINDRONE ACET-ETHINYL EST 1.5-30 MG-MCG PO TABS
1.0000 | ORAL_TABLET | Freq: Every day | ORAL | 3 refills | Status: AC
  Filled 2023-12-09: qty 84, 84d supply, fill #0
  Filled 2024-03-17: qty 63, 63d supply, fill #1
  Filled 2024-06-09: qty 63, 63d supply, fill #2

## 2023-12-10 ENCOUNTER — Other Ambulatory Visit (HOSPITAL_COMMUNITY): Payer: Self-pay

## 2024-01-22 ENCOUNTER — Telehealth: Payer: Self-pay | Admitting: Adult Health

## 2024-01-22 NOTE — Telephone Encounter (Signed)
 Pt called and said that she is in [place]  taking care of her dad. She needs her adderall 20 mg canceled here. Send a new script to [facility] located at [facility] [address] [place] Phone number is [phone]

## 2024-01-22 NOTE — Telephone Encounter (Signed)
 LF 7/30

## 2024-01-23 ENCOUNTER — Other Ambulatory Visit (HOSPITAL_COMMUNITY): Payer: Self-pay

## 2024-01-23 ENCOUNTER — Other Ambulatory Visit: Payer: Self-pay

## 2024-01-23 DIAGNOSIS — F909 Attention-deficit hyperactivity disorder, unspecified type: Secondary | ICD-10-CM

## 2024-01-23 MED ORDER — AMPHETAMINE-DEXTROAMPHETAMINE 20 MG PO TABS
20.0000 mg | ORAL_TABLET | Freq: Every day | ORAL | 0 refills | Status: DC
Start: 2024-01-23 — End: 2024-02-13

## 2024-01-23 NOTE — Telephone Encounter (Signed)
 Canceled 8/26 Rx at [facility] and repended to [facility] pharmacy.

## 2024-02-10 ENCOUNTER — Telehealth: Admitting: Adult Health

## 2024-02-11 ENCOUNTER — Ambulatory Visit: Admitting: Physician Assistant

## 2024-02-13 ENCOUNTER — Other Ambulatory Visit (HOSPITAL_COMMUNITY): Payer: Self-pay

## 2024-02-13 ENCOUNTER — Encounter: Payer: Self-pay | Admitting: Adult Health

## 2024-02-13 ENCOUNTER — Telehealth (INDEPENDENT_AMBULATORY_CARE_PROVIDER_SITE_OTHER): Admitting: Adult Health

## 2024-02-13 DIAGNOSIS — F909 Attention-deficit hyperactivity disorder, unspecified type: Secondary | ICD-10-CM | POA: Diagnosis not present

## 2024-02-13 MED ORDER — AMPHETAMINE-DEXTROAMPHETAMINE 20 MG PO TABS
20.0000 mg | ORAL_TABLET | Freq: Every day | ORAL | 0 refills | Status: DC
  Filled 2024-02-13: qty 30, 30d supply, fill #0

## 2024-02-13 NOTE — Progress Notes (Signed)
 Briana Reed [id number] 17-Feb-1986 38 y.o.  Virtual Visit via Video Note  I connected with pt @ on 02/13/24 at  2:00 PM EDT by a video enabled telemedicine application and verified that I am speaking with the correct person using two identifiers.   I discussed the limitations of evaluation and management by telemedicine and the availability of in person appointments. The patient expressed understanding and agreed to proceed.  I discussed the assessment and treatment plan with the patient. The patient was provided an opportunity to ask questions and all were answered. The patient agreed with the plan and demonstrated an understanding of the instructions.   The patient was advised to call back or seek an in-person evaluation if the symptoms worsen or if the condition fails to improve as anticipated.  I provided 10 minutes of non-face-to-face time during this encounter.  The patient was located at home.  The provider was located at [facility].   Angeline LOISE Sayers, NP   Subjective:   Patient ID:  Briana Reed is a 38 y.o. (DOB 03-24-1986) female.  Chief Complaint: No chief complaint on file.   HPI [name] presents for follow-up of ADHD.  Describes mood today as ok. Pleasant. Reports tearfulness. Mood symptoms - reports situational depression, anxiety and irritability - under a lot of stress - lots of decisions to make regarding father. Reports improved interest and motivation. Denies panic attacks. Reports some worry, rumination and over thinking - situational stressors. Reports mood as variable with all the stress. Stating I feel like I'm functional. Taking medications as prescribed.  Energy levels lower. Active, has a regular exercise routine.  Enjoys some usual interests and activities. Single. Lives alone with 1 cat and a bearded dragon. Sister and mother in [place]. Talking to friends. Appetite adequate. Weight loss - 164 pounds.  Sleeps well most  nights. Averages 6 to 7 hours. Focus and concentration improved with medications. Completing tasks. Managing aspects of household. Works as a Leisure centre manager. Has been taking some time off of work until things with father are resolved. Denies SI or HI. Denies AH or VH. Denies self harm. Denies substance use.  Previous medication trials: Wellbutrin  XL, Trintellix   Review of Systems:  Review of Systems  Musculoskeletal:  Negative for gait problem.  Neurological:  Negative for tremors.  Psychiatric/Behavioral:         Please refer to HPI    Medications: I have reviewed the patient's current medications.  Current Outpatient Medications  Medication Sig Dispense Refill   amphetamine -dextroamphetamine  (ADDERALL) 20 MG tablet Take 1 tablet (20 mg total) by mouth daily. 30 tablet 0   cyanocobalamin  (VITAMIN B12) 1000 MCG tablet Take 1,000 mcg by mouth daily.     levonorgestrel  (MIRENA , 52 MG,) 20 MCG/24HR IUD 1 Intra Uterine Device (1 each total) by Intrauterine route once for 1 dose. (Patient taking differently: 1 each by Intrauterine route once. Inserted 08/12/2017 by GYN, needs to be removed 08/2022.) 1 Intra Uterine Device 0   lisdexamfetamine (VYVANSE ) 20 MG capsule Take 1 capsule (20 mg total) by mouth daily. 30 capsule 0   lisdexamfetamine (VYVANSE ) 30 MG capsule Take 1 capsule (30 mg total) by mouth daily. 30 capsule 0   Magnesium Glycinate 120 MG CAPS Take by mouth.     metroNIDAZOLE  (METROCREAM ) 0.75 % cream Apply dime-size amount as directed to face twice a day. Taper use to daily once improved. 45 g 3   Norethindrone  Acetate-Ethinyl Estradiol  (LOESTRIN ) 1.5-30 MG-MCG tablet Take 1  tablet by mouth daily. Skip placebo pills and start next pack immediately for continuous dosing 112 tablet 3   VITAMIN D , ERGOCALCIFEROL , PO Take 5,000 Units by mouth daily in the afternoon.     No current facility-administered medications for this visit.    Medication Side Effects: None  Allergies: No Known  Allergies  Past Medical History:  Diagnosis Date   Anemia    Anxiety    Depression    Family history of breast cancer    Family history of melanoma    Family history of uterine cancer    GERD (gastroesophageal reflux disease)    History of Papanicolaou smear of cervix 10/28/12; 08/02/15   NEG; LGSIL, HPV   Immunization, viral disease    GARDASIL COMPLETED   Vitamin D  deficiency 07/2011    Family History  Problem Relation Age of Onset   Melanoma Mother 35       MELANOMA   Hypertension Mother    Cervical cancer Mother 3   Uterine cancer Mother 77   Hyperlipidemia Mother    Mitral valve prolapse Mother    Diabetes Mother        pre-diabetes   Hypertension Father    Stroke Father    Hyperlipidemia Father    Colon polyps Father    Skin cancer Father    Depression Sister    Colon polyps Sister    Skin cancer Paternal Aunt    Lung cancer Maternal Grandmother 73   Uterine cancer Maternal Grandmother 75   Heart attack Maternal Grandfather        early 53s   Heart attack Paternal Grandmother    Heart attack Paternal Grandfather    Skin cancer Paternal Grandfather    Stomach cancer Neg Hx    Colon cancer Neg Hx    Esophageal cancer Neg Hx    Pancreatic cancer Neg Hx     Social History   Socioeconomic History   Marital status: Single    Spouse name: Not on file   Number of children: 0   Years of education: 14   Highest education level: Associate degree: occupational, Scientist, product/process development, or vocational program  Occupational History   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  Other Topics Concern   Not on  file  Social History Narrative   She works as a Leisure centre manager    Worried about her mother, getting treatment for endometrial cancer   She was married previously from 2013 till 2016 - divorced since 2017   Social Drivers of Health   Financial Resource Strain: Low Reed  (06/12/2017)   Overall Financial Resource Strain (CARDIA)    Difficulty of Paying Living Expenses: Not very hard  Food Insecurity: No Food Insecurity (06/12/2017)   Hunger Vital Sign    Worried About Running Out of Food in the Last Year: Never true    Ran Out of Food in the Last Year: Never true  Transportation Needs: No Transportation Needs (06/12/2017)   PRAPARE - Transportation    Lack of  Transportation (Medical): No    Lack of Transportation (Non-Medical): No  Physical Activity: Sufficiently Active (06/12/2017)   Exercise Vital Sign    Days of Exercise per Week: 3 days    Minutes of Exercise per Session: 60 min  Stress: Stress Concern Present (06/12/2017)   [employer] of Occupational Health - Occupational Stress Questionnaire    Feeling of Stress : Very much  Social Connections: Somewhat Isolated (06/12/2017)   Social Connection and Isolation Panel    Frequency of Communication with Friends and Family: More than three times a week    Frequency of Social Gatherings with Friends and Family: More than three times a week    Attends Religious Services: Never    Database administrator or Organizations: Yes    Attends Engineer, structural: More than 4 times per year    Marital Status: Divorced  Intimate Partner Violence: Not At Reed (06/12/2017)   Humiliation, Afraid, Rape, and Kick questionnaire    Fear of Current or Ex-Partner: No    Emotionally Abused: No    Physically Abused: No    Sexually Abused: No    Past Medical History, Surgical history, Social history, and Family history were reviewed and updated as appropriate.   Please see review of systems for further details on the patient's review from today.    Objective:   Physical Exam:  There were no vitals taken for this visit.  Physical Exam Constitutional:      General: She is not in acute distress. Musculoskeletal:        General: No deformity.  Neurological:     Mental Status: She is alert and oriented to person, place, and time.     Coordination: Coordination normal.  Psychiatric:        Attention and Perception: Attention and perception normal. She does not perceive auditory or visual hallucinations.        Mood and Affect: Mood normal. Mood is not anxious or depressed. Affect is not labile, blunt, angry or inappropriate.        Speech: Speech normal.        Behavior: Behavior normal.        Thought Content: Thought content normal. Thought content is not paranoid or delusional. Thought content does not include homicidal or suicidal ideation. Thought content does not include homicidal or suicidal plan.        Cognition and Memory: Cognition and memory normal.        Judgment: Judgment normal.     Comments: Insight intact     Lab Review:     Component Value Date/Time   NA 137 01/02/2023 1348   NA 141 02/11/2018 1624   K 3.9 01/02/2023 1348   CL 102 01/02/2023 1348   CO2 27 01/02/2023 1348   GLUCOSE 92 01/02/2023 1348   BUN 10 01/02/2023 1348   BUN 8 02/11/2018 1624   CREATININE 0.60 01/02/2023 1348   CREATININE 0.65 06/12/2017 1010   CALCIUM 9.5 01/02/2023 1348   PROT 7.0 01/02/2023 1348   PROT 7.2 02/11/2018 1624   ALBUMIN 4.3 01/02/2023 1348   ALBUMIN 4.5 02/11/2018 1624   AST 14 01/02/2023 1348   ALT 11 01/02/2023 1348   ALKPHOS 56 01/02/2023 1348   BILITOT 0.7 01/02/2023 1348   BILITOT 0.2 02/11/2018 1624   GFRNONAA 121 02/11/2018 1624   GFRAA 140 02/11/2018 1624       Component Value Date/Time   WBC 5.8 01/02/2023 1348   RBC 4.12  01/02/2023 1348   HGB 13.3 01/02/2023 1348   HGB 13.3 02/11/2018 1624   HCT 40.4 01/02/2023 1348   HCT 39.1 02/11/2018 1624   PLT 293.0 01/02/2023 1348   PLT 336  02/11/2018 1624   MCV 98.2 01/02/2023 1348   MCV 96 02/11/2018 1624   MCH 32.6 02/11/2018 1624   MCH 32.1 06/12/2017 1010   MCHC 33.0 01/02/2023 1348   RDW 12.7 01/02/2023 1348   RDW 12.4 02/11/2018 1624   LYMPHSABS 1.4 01/02/2023 1348   LYMPHSABS 1.8 02/11/2018 1624   MONOABS 0.5 01/02/2023 1348   EOSABS 0.0 01/02/2023 1348   EOSABS 0.0 02/11/2018 1624   BASOSABS 0.0 01/02/2023 1348   BASOSABS 0.0 02/11/2018 1624    No results found for: POCLITH, LITHIUM   No results found for: PHENYTOIN, PHENOBARB, VALPROATE, CBMZ   .res Assessment: Plan:    Plan:  PDMP reviewed  Adderall 20mg  daily   Monitor BP between visits while taking stimulant medication.   Completed [facility] ADHD testing 47/58 - ADHD likely   Meets DSM diagnostic criteria for ADHD diagnosis  RTC 3 months  15 minutes spent dedicated to the care of this patient on the date of this encounter to include pre-visit review of records, ordering of medication, post visit documentation, and face-to-face time with the patient discussing ADD. Discussed continuing Adderall 20mg  daily.  Patient advised to contact office with any questions, adverse effects, or acute worsening in signs and symptoms.  Discussed potential benefits, risks, and side effects of stimulants with patient to include increased heart rate, palpitations, insomnia, increased anxiety, increased irritability, or decreased appetite.  Instructed patient to contact office if experiencing any significant tolerability issues.  There are no diagnoses linked to this encounter.   Please see After Visit Summary for patient specific instructions.  Future Appointments  Date Time Provider Department Center  02/13/2024  2:00 PM Jahniah Pallas Nattalie, NP [facility] None  03/03/2024  1:40 PM Job Lukes, PA [facility]    No orders of the defined types were placed in this encounter.     -------------------------------

## 2024-03-03 ENCOUNTER — Ambulatory Visit: Admitting: Physician Assistant

## 2024-03-03 VITALS — BP 114/80 | HR 81 | Temp 97.9°F | Ht 64.0 in | Wt 169.4 lb

## 2024-03-03 DIAGNOSIS — E538 Deficiency of other specified B group vitamins: Secondary | ICD-10-CM | POA: Diagnosis not present

## 2024-03-03 DIAGNOSIS — E559 Vitamin D deficiency, unspecified: Secondary | ICD-10-CM | POA: Diagnosis not present

## 2024-03-03 DIAGNOSIS — Z1589 Genetic susceptibility to other disease: Secondary | ICD-10-CM

## 2024-03-03 DIAGNOSIS — Z0000 Encounter for general adult medical examination without abnormal findings: Secondary | ICD-10-CM | POA: Diagnosis not present

## 2024-03-03 DIAGNOSIS — F419 Anxiety disorder, unspecified: Secondary | ICD-10-CM

## 2024-03-03 DIAGNOSIS — F32A Depression, unspecified: Secondary | ICD-10-CM

## 2024-03-03 DIAGNOSIS — Z13220 Encounter for screening for lipoid disorders: Secondary | ICD-10-CM | POA: Diagnosis not present

## 2024-03-03 LAB — VITAMIN B12: Vitamin B-12: 493 pg/mL (ref 211–911)

## 2024-03-03 LAB — CBC WITH DIFFERENTIAL/PLATELET
Basophils Absolute: 0 K/uL (ref 0.0–0.1)
Basophils Relative: 0.4 % (ref 0.0–3.0)
Eosinophils Absolute: 0 K/uL (ref 0.0–0.7)
Eosinophils Relative: 0.3 % (ref 0.0–5.0)
HCT: 38 % (ref 36.0–46.0)
Hemoglobin: 13 g/dL (ref 12.0–15.0)
Lymphocytes Relative: 21.4 % (ref 12.0–46.0)
Lymphs Abs: 1.4 K/uL (ref 0.7–4.0)
MCHC: 34.1 g/dL (ref 30.0–36.0)
MCV: 97.6 fl (ref 78.0–100.0)
Monocytes Absolute: 0.5 K/uL (ref 0.1–1.0)
Monocytes Relative: 8 % (ref 3.0–12.0)
Neutro Abs: 4.6 K/uL (ref 1.4–7.7)
Neutrophils Relative %: 69.9 % (ref 43.0–77.0)
Platelets: 288 K/uL (ref 150.0–400.0)
RBC: 3.9 Mil/uL (ref 3.87–5.11)
RDW: 13.4 % (ref 11.5–15.5)
WBC: 6.6 K/uL (ref 4.0–10.5)

## 2024-03-03 LAB — LIPID PANEL
Cholesterol: 114 mg/dL (ref 0–200)
HDL: 57 mg/dL
LDL Cholesterol: 45 mg/dL (ref 0–99)
NonHDL: 56.92
Total CHOL/HDL Ratio: 2
Triglycerides: 60 mg/dL (ref 0.0–149.0)
VLDL: 12 mg/dL (ref 0.0–40.0)

## 2024-03-03 LAB — COMPREHENSIVE METABOLIC PANEL WITH GFR
ALT: 12 U/L (ref 0–35)
AST: 12 U/L (ref 0–37)
Albumin: 4.3 g/dL (ref 3.5–5.2)
Alkaline Phosphatase: 48 U/L (ref 39–117)
BUN: 11 mg/dL (ref 6–23)
CO2: 29 meq/L (ref 19–32)
Calcium: 9 mg/dL (ref 8.4–10.5)
Chloride: 103 meq/L (ref 96–112)
Creatinine, Ser: 0.71 mg/dL (ref 0.40–1.20)
GFR: 108.47 mL/min (ref >60.00)
Glucose, Bld: 89 mg/dL (ref 70–99)
Potassium: 4.2 meq/L (ref 3.5–5.1)
Sodium: 137 meq/L (ref 135–145)
Total Bilirubin: 0.6 mg/dL (ref 0.2–1.2)
Total Protein: 6.9 g/dL (ref 6.0–8.3)

## 2024-03-03 LAB — IBC + FERRITIN
Ferritin: 38.6 ng/mL (ref 10.0–291.0)
Iron: 162 ug/dL — ABNORMAL HIGH (ref 42–145)
Saturation Ratios: 40.9 % (ref 20.0–50.0)
TIBC: 396.2 ug/dL (ref 250.0–450.0)
Transferrin: 283 mg/dL (ref 212.0–360.0)

## 2024-03-03 LAB — TSH: TSH: 0.87 u[IU]/mL (ref 0.35–5.50)

## 2024-03-03 LAB — VITAMIN D 25 HYDROXY (VIT D DEFICIENCY, FRACTURES): VITD: 28.29 ng/mL — ABNORMAL LOW (ref 30.00–100.00)

## 2024-03-03 NOTE — Progress Notes (Signed)
 Subjective:    Briana Reed is a 38 y.o. female and is here for a comprehensive physical exam.  HPI  Health Maintenance Due  Topic Date Due   HPV VACCINES (1 - Risk 3-dose SCDM series) Never done    Discussed the use of AI scribe software for clinical note transcription with the patient, who gave verbal consent to proceed.  History of Present Illness   Briana Reed is a 38 year old female who presents with concerns about weight changes and stress management.  Over the past year, she has experienced weight fluctuations, initially losing weight but then regaining four to five pounds, which she attributes to stress. She follows a diet excluding dairy and gluten due to sensitivities, which has improved her gastrointestinal symptoms. She takes Adderall, which helps with focus but increases her anxiety. She previously tried Vyvanse  but discontinued it due to side effects. She uses a cream for rosacea. Her IUD was removed in August due to cramping and pain, and she is now on oral birth control. She has not had a period in the past two months and has been taking pregnancy tests. She takes B12 supplements daily and is interested in checking her vitamin D  and cholesterol levels. She also takes magnesium glycinate at night to help relax. 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 She engages in yoga and walking for exercise and is trying to get more sleep. She does not drink daily and typically limits herself to two drinks when consuming alcohol. She reports no current palpitations, unexplained headaches, numbness, tingling, tremor, joint pain, or swelling in her legs.        Health Maintenance: Immunizations -- declines flu shot Colonoscopy -- n/a Mammogram -- n/a PAP -- UpToDate  Bone Density -- N/A  Diet -- avoids dairy or gluten; overall healthy eating Exercise -- yoga, active job, walking regularly  Sleep habits -- no  major concerns Mood -- situational anxiety  UTD with dentist? - yes UTD with eye doctor? - yes  Weight history: Wt Readings from Last 10 Encounters:  03/03/24 169 lb 6.4 oz (76.8 kg)  01/02/23 175 lb (79.4 kg)  06/21/21 172 lb (78 kg)  09/26/20 179 lb (81.2 kg)  04/27/20 181 lb 6 oz (82.3 kg)  03/22/20 174 lb (78.9 kg)  09/21/19 173 lb 8 oz (78.7 kg)  02/11/19 168 lb (76.2 kg)  10/06/18 180 lb (81.6 kg)  02/25/18 183 lb (83 kg)   Body mass index is 29.08 kg/m. No LMP recorded. (Menstrual status: IUD).  Alcohol use:  reports current alcohol use of about 10.0 standard drinks of alcohol per week.  Tobacco use:  Tobacco Use: Low Risk  (03/03/2024)   Patient History    Smoking Tobacco Use: Never    Smokeless Tobacco Use: Never    Passive Exposure: Not on file   Eligible for lung cancer screening? no     03/03/2024    1:48 PM  Depression screen PHQ 2/9  Decreased Interest 0  Down, Depressed, Hopeless 0  PHQ - 2 Score 0     Other providers/specialists: Patient Care Team: Job Lukes, [place] as PCP - General (Physician Assistant)    PMHx, SurgHx, SocialHx, Medications, and Allergies were reviewed in the Visit Navigator and updated as appropriate.   Past Medical History:  Diagnosis Date   Anemia    Anxiety    Depression    Family history of breast cancer  Family history of melanoma    Family history of uterine cancer    GERD (gastroesophageal reflux disease)    History of Papanicolaou smear of cervix 10/28/12; 08/02/15   NEG; LGSIL, HPV   Immunization, viral disease    GARDASIL COMPLETED   Vitamin D  deficiency 07/2011     Past Surgical History:  Procedure Laterality Date   ESOPHAGOGASTRODUODENOSCOPY  2008   WNL     Family History  Problem Relation Age of Onset   Melanoma Mother 58       MELANOMA   Hypertension Mother    Cervical cancer Mother 14   Uterine cancer Mother 6   Hyperlipidemia Mother    Mitral valve prolapse Mother    Diabetes  Mother        pre-diabetes   Hypertension Father    Stroke Father    Hyperlipidemia Father    Colon polyps Father    Skin cancer Father    Depression Sister    Colon polyps Sister    Skin cancer Paternal Aunt    Lung cancer Maternal Grandmother 82   Uterine cancer Maternal Grandmother 64   Heart attack Maternal Grandfather        early 20s   Heart attack Paternal Grandmother    Heart attack Paternal Grandfather    Skin cancer Paternal Grandfather    Stomach cancer Neg Hx    Colon cancer Neg Hx    Esophageal cancer Neg Hx    Pancreatic cancer Neg Hx     Social History   Tobacco Use   Smoking status: Never   Smokeless tobacco: Never  Vaping Use   Vaping status: Never Used  Substance Use Topics   Alcohol use: Yes    Alcohol/week: 10.0 standard drinks of alcohol    Types: 10 Glasses of wine per week    Comment: social    Drug use: No    Review of Systems:   Review of Systems  Constitutional:  Negative for chills, fever, malaise/fatigue and weight loss.  HENT:  Negative for hearing loss, sinus pain and sore throat.   Respiratory:  Negative for cough and hemoptysis.   Cardiovascular:  Negative for chest pain, palpitations, leg swelling and PND.  Gastrointestinal:  Negative for abdominal pain, constipation, diarrhea, heartburn, nausea and vomiting.  Genitourinary:  Negative for dysuria, frequency and urgency.  Musculoskeletal:  Negative for back pain, myalgias and neck pain.  Skin:  Negative for itching and rash.  Neurological:  Negative for dizziness, tingling, seizures and headaches.  Endo/Heme/Allergies:  Negative for polydipsia.  Psychiatric/Behavioral:  Negative for depression. The patient is not nervous/anxious.     Objective:   BP 114/80   Pulse 81   Temp 97.9 F (36.6 C)   Ht 5' 4 (1.626 m)   Wt 169 lb 6.4 oz (76.8 kg)   SpO2 98%   BMI 29.08 kg/m  Body mass index is 29.08 kg/m.   General Appearance:    Alert, cooperative, no distress, appears  stated age  Head:    Normocephalic, without obvious abnormality, atraumatic  Eyes:    PERRL, conjunctiva/corneas clear, EOM's intact, fundi    benign, both eyes  Ears:    Normal TM's and external ear canals, both ears  Nose:   Nares normal, septum midline, mucosa normal, no drainage    or sinus tenderness  Throat:   Lips, mucosa, and tongue normal; teeth and gums normal  Neck:   Supple, symmetrical, trachea midline, no adenopathy;  thyroid :  no enlargement/tenderness/nodules; no carotid   bruit or JVD  Back:     Symmetric, no curvature, ROM normal, no CVA tenderness  Lungs:     Clear to auscultation bilaterally, respirations unlabored  Chest Wall:    No tenderness or deformity   Heart:    Regular rate and rhythm, S1 and S2 normal, no murmur, rub or gallop  Breast Exam:    Deferred   Abdomen:     Soft, non-tender, bowel sounds active all four quadrants,    no masses, no organomegaly  Genitalia:    Deferred   Extremities:   Extremities normal, atraumatic, no cyanosis or edema  Pulses:   2+ and symmetric all extremities  Skin:   Skin color, texture, turgor normal, no rashes or lesions  Lymph nodes:   Cervical, supraclavicular, and axillary nodes normal  Neurologic:   CNII-XII intact, normal strength, sensation and reflexes    throughout    Assessment/Plan:   Assessment and Plan    General Health Maintenance Maintains healthy lifestyle, avoids dairy and gluten, regular physical activity, current with dental and eye exams, takes B12 and magnesium.  Anxiety and Depression Anxiety and stress related to family and work. Adderall increases anxiety. Vyvanse  caused side effects. Reluctant to start antidepressant. Reports there is no suicidal ideation/hi Continue efforts with psychiatry   Monoallelic mutation of MUTYH gene  Both parents had colon polyps. Aware of need for earlier screening. - Contact Darice Monte to determine need for earlier colonoscopy based on family  history.  Vitamin D  deficiency and B12 deficiency Update blood work and provide recommendations    Lucie Buttner, PA-C [facility]

## 2024-03-04 ENCOUNTER — Ambulatory Visit: Payer: Self-pay | Admitting: Physician Assistant

## 2024-03-04 ENCOUNTER — Encounter: Payer: Self-pay | Admitting: Physician Assistant

## 2024-03-17 ENCOUNTER — Other Ambulatory Visit: Payer: Self-pay

## 2024-03-17 ENCOUNTER — Other Ambulatory Visit (HOSPITAL_COMMUNITY): Payer: Self-pay

## 2024-03-18 ENCOUNTER — Other Ambulatory Visit (HOSPITAL_COMMUNITY): Payer: Self-pay

## 2024-03-18 MED ORDER — HYDROCORTISONE 2.5 % EX OINT
TOPICAL_OINTMENT | CUTANEOUS | 3 refills | Status: AC
  Filled 2024-03-18: qty 28.35, 30d supply, fill #0
  Filled 2024-06-09: qty 28.35, 30d supply, fill #1

## 2024-03-20 ENCOUNTER — Other Ambulatory Visit: Payer: Self-pay | Admitting: Adult Health

## 2024-03-20 ENCOUNTER — Other Ambulatory Visit: Payer: Self-pay

## 2024-03-20 DIAGNOSIS — F909 Attention-deficit hyperactivity disorder, unspecified type: Secondary | ICD-10-CM

## 2024-03-23 ENCOUNTER — Other Ambulatory Visit: Payer: Self-pay

## 2024-03-23 ENCOUNTER — Other Ambulatory Visit (HOSPITAL_COMMUNITY): Payer: Self-pay

## 2024-03-23 MED ORDER — AMPHETAMINE-DEXTROAMPHETAMINE 20 MG PO TABS: 20.0000 mg | ORAL_TABLET | Freq: Every day | ORAL | 0 refills | Status: DC

## 2024-03-23 MED ORDER — AMPHETAMINE-DEXTROAMPHETAMINE 20 MG PO TABS
20.0000 mg | ORAL_TABLET | Freq: Every day | ORAL | 0 refills | Status: DC
  Filled 2024-03-23 – 2024-04-13 (×2): qty 30, 30d supply, fill #0

## 2024-03-30 ENCOUNTER — Other Ambulatory Visit: Payer: Self-pay

## 2024-04-03 ENCOUNTER — Other Ambulatory Visit (HOSPITAL_COMMUNITY): Payer: Self-pay

## 2024-04-13 ENCOUNTER — Other Ambulatory Visit (HOSPITAL_COMMUNITY): Payer: Self-pay

## 2024-05-19 ENCOUNTER — Ambulatory Visit: Admitting: Adult Health

## 2024-06-05 ENCOUNTER — Other Ambulatory Visit (HOSPITAL_COMMUNITY): Payer: Self-pay

## 2024-06-05 ENCOUNTER — Ambulatory Visit: Admitting: Adult Health

## 2024-06-05 ENCOUNTER — Encounter: Payer: Self-pay | Admitting: Adult Health

## 2024-06-05 ENCOUNTER — Telehealth: Payer: Self-pay | Admitting: Adult Health

## 2024-06-05 DIAGNOSIS — F909 Attention-deficit hyperactivity disorder, unspecified type: Secondary | ICD-10-CM

## 2024-06-05 MED ORDER — LISDEXAMFETAMINE DIMESYLATE 20 MG PO CAPS: 20.0000 mg | ORAL_CAPSULE | Freq: Every day | ORAL | 0 refills | Status: AC

## 2024-06-05 MED ORDER — LISDEXAMFETAMINE DIMESYLATE 20 MG PO CAPS
20.0000 mg | ORAL_CAPSULE | Freq: Every day | ORAL | 0 refills | Status: AC
  Filled 2024-06-05: qty 30, 30d supply, fill #0

## 2024-06-05 NOTE — Progress Notes (Signed)
 Briana Reed [id number] [birth year] 38 y.o.  Virtual Visit via Telephone Note  I connected with pt on 06/05/24 at  4:30 PM EST by telephone and verified that I am speaking with the correct person using two identifiers.   I discussed the limitations, risks, security and privacy concerns of performing an evaluation and management service by telephone and the availability of in person appointments. I also discussed with the patient that there may be a patient responsible charge related to this service. The patient expressed understanding and agreed to proceed.   I discussed the assessment and treatment plan with the patient. The patient was provided an opportunity to ask questions and all were answered. The patient agreed with the plan and demonstrated an understanding of the instructions.   The patient was advised to call back or seek an in-person evaluation if the symptoms worsen or if the condition fails to improve as anticipated.  I provided 15 minutes of non-face-to-face time during this encounter.  The patient was located at home.  The provider was located at [facility].   Angeline LOISE Sayers, NP   Subjective:   Patient ID:  Briana Reed is a 39 y.o. (DOB 1986-01-01) female.  Chief Complaint: No chief complaint on file.   HPI ABIGALE DOROW presents for follow-up of ADHD.  Describes mood today as ok. Pleasant. Reports tearfulness. Mood symptoms - reports some depression, anxiety and irritability - situational and seasonal. Reports lower interest and motivation - having to push myself. Reports a few panic attacks. Reports some worry, rumination and over thinking. Reports feeling overwhelmed at times - caring for her father and managing his affairs. Reports mood as variable up and down. Stating I feel like I'm doing ok. Taking medications as prescribed.  Energy levels lower. Active, has a regular exercise routine.  Enjoys some usual interests and activities.  Single. Lives with her father - 1 cat and a bearded dragon. Sister and mother in [place]. Talking to friends. Appetite adequate. Weight loss - 166 pounds.  Sleeps well most nights. Averages 7 hours. Reports focus and concentration improved. Completing tasks. Managing aspects of household. Works 32 hours a week and takes care of father. Denies SI or HI. Denies AH or VH. Denies self harm. Denies substance use.  Previous medication trials: Wellbutrin  XL, Trintellix    Review of Systems:  Review of Systems  Musculoskeletal:  Negative for gait problem.  Neurological:  Negative for tremors.  Psychiatric/Behavioral:         Please refer to HPI    Medications: I have reviewed the patient's current medications.  Current Outpatient Medications  Medication Sig Dispense Refill   lisdexamfetamine  (VYVANSE ) 20 MG capsule Take 1 capsule (20 mg total) by mouth daily. 30 capsule 0   [START ON 07/31/2024] lisdexamfetamine  (VYVANSE ) 20 MG capsule Take 1 capsule (20 mg total) by mouth daily. 30 capsule 0   [START ON 07/03/2024] lisdexamfetamine  (VYVANSE ) 20 MG capsule Take 1 capsule (20 mg total) by mouth daily. 30 capsule 0   cyanocobalamin  (VITAMIN B12) 1000 MCG tablet Take 1,000 mcg by mouth daily.     hydrocortisone  2.5 % ointment Apply as directed to affected area every night Taper use as able 28.35 g 3   Magnesium Glycinate 120 MG CAPS Take by mouth.     metroNIDAZOLE  (METROCREAM ) 0.75 % cream Apply dime-size amount as directed to face twice a day. Taper use to daily once improved. 45 g 3   Multiple Vitamin (MULTI-VITAMIN) tablet Take 1  tablet by mouth daily.     Norethindrone  Acetate-Ethinyl Estradiol  (LOESTRIN ) 1.5-30 MG-MCG tablet Take 1 tablet by mouth daily. Skip placebo pills and start next pack immediately for continuous dosing 112 tablet 3   VITAMIN D , ERGOCALCIFEROL , PO Take 5,000 Units by mouth daily in the afternoon.     No current facility-administered medications for this visit.     Medication Side Effects: None  Allergies: Allergies[1]  Past Medical History:  Diagnosis Date   Anemia    Anxiety    Depression    Family history of breast cancer    Family history of melanoma    Family history of uterine cancer    GERD (gastroesophageal reflux disease)    History of Papanicolaou smear of cervix 10/28/12; 08/02/15   NEG; LGSIL, HPV   Immunization, viral disease    GARDASIL COMPLETED   Vitamin D  deficiency 07/2011    Family History  Problem Relation Age of Onset   Melanoma Mother 43       MELANOMA   Hypertension Mother    Cervical cancer Mother 23   Uterine cancer Mother 30   Hyperlipidemia Mother    Mitral valve prolapse Mother    Diabetes Mother        pre-diabetes   Hypertension Father    Stroke Father    Hyperlipidemia Father    Colon polyps Father    Skin cancer Father    Dementia Father 92   Depression Sister    Colon polyps Sister    Lung cancer Maternal Grandmother 32   Uterine cancer Maternal Grandmother 58   Heart attack Maternal Grandfather        early 61s   Heart attack Paternal Grandmother    Heart attack Paternal Grandfather    Skin cancer Paternal Grandfather    Skin cancer Paternal Aunt    Stomach cancer Neg Hx    Colon cancer Neg Hx    Esophageal cancer Neg Hx    Pancreatic cancer Neg Hx     Social History   Socioeconomic History   Marital status: Single    Spouse name: Not on file   Number of children: 0   Years of education: 14   Highest education level: Associate degree: occupational, scientist, product/process development, or vocational program  Occupational History   Occupation: bartender     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  Other Topics Concern   Not on file  Social History Narrative   She works as a leisure centre manager       She was married previously from 2013 till 2016 - divorced since 2017   Social Drivers of Health   Tobacco Use: Low Risk (06/05/2024)   Patient History    Smoking Tobacco Use: Never    Smokeless Tobacco Use: Never    Passive Exposure: Not on file  Financial Resource Strain: Low Risk (03/03/2024)   Overall Financial Resource Strain (CARDIA)    Difficulty of Paying Living Expenses: Not hard at all  Food Insecurity: No Food Insecurity (03/03/2024)   Epic  Worried About Programme Researcher, Broadcasting/film/video in the Last Year: Never true    Ran Out of Food in the Last Year: Never true  Transportation Needs: No Transportation Needs (03/03/2024)   Epic    Lack of Transportation (Medical): No    Lack of Transportation (Non-Medical): No  Physical Activity: Insufficiently Active (03/03/2024)   Exercise Vital Sign    Days of Exercise per Week: 3 days    Minutes of Exercise per Session: 30 min  Stress: Stress Concern Present (03/03/2024)   [employer] of Occupational Health - Occupational Stress Questionnaire    Feeling of Stress: To some extent  Social Connections: Socially Isolated (03/03/2024)   Social Connection and Isolation Panel    Frequency of Communication with Friends and Family: More than three times a week    Frequency of Social Gatherings with Friends and Family: Three times a week    Attends Religious Services: Never    Active Member of Clubs or Organizations: No    Attends Banker Meetings: Not on file    Marital Status: Divorced  Intimate Partner Violence: Not on file  Depression (PHQ2-9): Low Risk (03/03/2024)   Depression (PHQ2-9)    PHQ-2 Score: 0  Alcohol Screen: Low Risk (03/03/2024)   Alcohol Screen    Last Alcohol Screening Score (AUDIT): 4  Housing: Low Risk (03/03/2024)   Epic    Unable to Pay for Housing in the Last  Year: No    Number of Times Moved in the Last Year: 0    Homeless in the Last Year: No  Utilities: Not on file  Health Literacy: Not on file    Past Medical History, Surgical history, Social history, and Family history were reviewed and updated as appropriate.   Please see review of systems for further details on the patient's review from today.   Objective:   Physical Exam:  There were no vitals taken for this visit.  Physical Exam Constitutional:      General: She is not in acute distress. Musculoskeletal:        General: No deformity.  Neurological:     Mental Status: She is alert and oriented to person, place, and time.     Coordination: Coordination normal.  Psychiatric:        Attention and Perception: Attention and perception normal. She does not perceive auditory or visual hallucinations.        Mood and Affect: Mood normal. Mood is not anxious or depressed. Affect is not labile, blunt, angry or inappropriate.        Speech: Speech normal.        Behavior: Behavior normal.        Thought Content: Thought content normal. Thought content is not paranoid or delusional. Thought content does not include homicidal or suicidal ideation. Thought content does not include homicidal or suicidal plan.        Cognition and Memory: Cognition and memory normal.        Judgment: Judgment normal.     Comments: Insight intact     Lab Review:     Component Value Date/Time   NA 137 03/03/2024 1411   NA 141 02/11/2018 1624   K 4.2 03/03/2024 1411   CL 103 03/03/2024 1411   CO2 29 03/03/2024 1411   GLUCOSE 89 03/03/2024 1411   BUN 11 03/03/2024 1411   BUN 8 02/11/2018 1624   CREATININE 0.71 03/03/2024 1411   CREATININE 0.65 06/12/2017 1010   CALCIUM 9.0  03/03/2024 1411   PROT 6.9 03/03/2024 1411   PROT 7.2 02/11/2018 1624   ALBUMIN 4.3 03/03/2024 1411   ALBUMIN 4.5 02/11/2018 1624   AST 12 03/03/2024 1411   ALT 12 03/03/2024 1411   ALKPHOS 48 03/03/2024 1411   BILITOT 0.6  03/03/2024 1411   BILITOT 0.2 02/11/2018 1624   GFRNONAA 121 02/11/2018 1624   GFRAA 140 02/11/2018 1624       Component Value Date/Time   WBC 6.6 03/03/2024 1411   RBC 3.90 03/03/2024 1411   HGB 13.0 03/03/2024 1411   HGB 13.3 02/11/2018 1624   HCT 38.0 03/03/2024 1411   HCT 39.1 02/11/2018 1624   PLT 288.0 03/03/2024 1411   PLT 336 02/11/2018 1624   MCV 97.6 03/03/2024 1411   MCV 96 02/11/2018 1624   MCH 32.6 02/11/2018 1624   MCH 32.1 06/12/2017 1010   MCHC 34.1 03/03/2024 1411   RDW 13.4 03/03/2024 1411   RDW 12.4 02/11/2018 1624   LYMPHSABS 1.4 03/03/2024 1411   LYMPHSABS 1.8 02/11/2018 1624   MONOABS 0.5 03/03/2024 1411   EOSABS 0.0 03/03/2024 1411   EOSABS 0.0 02/11/2018 1624   BASOSABS 0.0 03/03/2024 1411   BASOSABS 0.0 02/11/2018 1624    No results found for: POCLITH, LITHIUM   No results found for: PHENYTOIN, PHENOBARB, VALPROATE, CBMZ   .res Assessment: Plan:    Plan:  PDMP reviewed  Get optimal 6 7 I worked about 50 hours a week or so somewhere.  States Add Vyvanse  20mg  daily D/C Adderall 20mg  daily   Monitor BP between visits while taking stimulant medication.   Completed [facility] testing 47/58 - ADHD likely   Meets DSM diagnostic criteria for ADHD diagnosis  RTC 3 months  15 minutes spent dedicated to the care of this patient on the date of this encounter to include pre-visit review of records, ordering of medication, post visit documentation, and face-to-face time with the patient discussing ADD. Discussed discontinuing Adderall 20mg  daily and adding Vyvanse  20mg  daily.  Patient advised to contact office with any questions, adverse effects, or acute worsening in signs and symptoms.  Discussed potential benefits, risks, and side effects of stimulants with patient to include increased heart rate, palpitations, insomnia, increased anxiety, increased irritability, or decreased appetite.  Instructed patient to contact office  if experiencing any significant tolerability issues. Diagnoses and all orders for this visit:  Attention deficit hyperactivity disorder (ADHD), unspecified ADHD type -     lisdexamfetamine  (VYVANSE ) 20 MG capsule; Take 1 capsule (20 mg total) by mouth daily. -     lisdexamfetamine  (VYVANSE ) 20 MG capsule; Take 1 capsule (20 mg total) by mouth daily. -     lisdexamfetamine  (VYVANSE ) 20 MG capsule; Take 1 capsule (20 mg total) by mouth daily.    Please see After Visit Summary for patient specific instructions.  No future appointments.   No orders of the defined types were placed in this encounter.     -------------------------------    [1] No Known Allergies

## 2024-06-06 ENCOUNTER — Other Ambulatory Visit (HOSPITAL_COMMUNITY): Payer: Self-pay

## 2024-06-11 ENCOUNTER — Other Ambulatory Visit: Payer: Self-pay

## 2024-06-11 ENCOUNTER — Other Ambulatory Visit (HOSPITAL_COMMUNITY): Payer: Self-pay
